# Patient Record
Sex: Female | Born: 1955 | Race: White | Hispanic: No | Smoking: Current every day smoker
Health system: Southern US, Community
[De-identification: ages and names within clinical notes are randomized; demographics above are authoritative.]

## PROBLEM LIST (undated history)

## (undated) DIAGNOSIS — K219 Gastro-esophageal reflux disease without esophagitis: Secondary | ICD-10-CM

## (undated) DIAGNOSIS — N3020 Other chronic cystitis without hematuria: Secondary | ICD-10-CM

## (undated) DIAGNOSIS — R1013 Epigastric pain: Secondary | ICD-10-CM

## (undated) DIAGNOSIS — J45909 Unspecified asthma, uncomplicated: Secondary | ICD-10-CM

## (undated) DIAGNOSIS — C801 Malignant (primary) neoplasm, unspecified: Secondary | ICD-10-CM

## (undated) DIAGNOSIS — Z8739 Personal history of other diseases of the musculoskeletal system and connective tissue: Secondary | ICD-10-CM

## (undated) DIAGNOSIS — I700 Atherosclerosis of aorta: Secondary | ICD-10-CM

## (undated) DIAGNOSIS — F4010 Social phobia, unspecified: Secondary | ICD-10-CM

## (undated) DIAGNOSIS — R1310 Dysphagia, unspecified: Secondary | ICD-10-CM

## (undated) DIAGNOSIS — R519 Headache, unspecified: Secondary | ICD-10-CM

## (undated) DIAGNOSIS — I639 Cerebral infarction, unspecified: Secondary | ICD-10-CM

## (undated) DIAGNOSIS — E7800 Pure hypercholesterolemia, unspecified: Secondary | ICD-10-CM

## (undated) DIAGNOSIS — F17200 Nicotine dependence, unspecified, uncomplicated: Secondary | ICD-10-CM

## (undated) DIAGNOSIS — F32A Depression, unspecified: Secondary | ICD-10-CM

## (undated) DIAGNOSIS — F419 Anxiety disorder, unspecified: Secondary | ICD-10-CM

## (undated) DIAGNOSIS — I779 Disorder of arteries and arterioles, unspecified: Secondary | ICD-10-CM

## (undated) DIAGNOSIS — I10 Essential (primary) hypertension: Secondary | ICD-10-CM

## (undated) DIAGNOSIS — I739 Peripheral vascular disease, unspecified: Secondary | ICD-10-CM

## (undated) DIAGNOSIS — Z974 Presence of external hearing-aid: Secondary | ICD-10-CM

## (undated) DIAGNOSIS — F319 Bipolar disorder, unspecified: Secondary | ICD-10-CM

## (undated) DIAGNOSIS — J4489 Other specified chronic obstructive pulmonary disease: Secondary | ICD-10-CM

## (undated) DIAGNOSIS — J449 Chronic obstructive pulmonary disease, unspecified: Secondary | ICD-10-CM

## (undated) DIAGNOSIS — G459 Transient cerebral ischemic attack, unspecified: Secondary | ICD-10-CM

## (undated) DIAGNOSIS — L900 Lichen sclerosus et atrophicus: Secondary | ICD-10-CM

## (undated) HISTORY — PX: BREAST BIOPSY: SHX20

## (undated) HISTORY — PX: ESOPHAGOGASTRODUODENOSCOPY: SHX1529

## (undated) HISTORY — PX: TONSILLECTOMY: SUR1361

## (undated) HISTORY — PX: TUBAL LIGATION: SHX77

## (undated) HISTORY — PX: OTHER SURGICAL HISTORY: SHX169

## (undated) HISTORY — PX: HEMORRHOIDECTOMY WITH HEMORRHOID BANDING: SHX5633

## (undated) HISTORY — PX: CHOLECYSTECTOMY: SHX55

---

## 1998-01-04 ENCOUNTER — Other Ambulatory Visit: Admission: RE | Admit: 1998-01-04 | Discharge: 1998-01-04 | Payer: Self-pay | Admitting: Obstetrics & Gynecology

## 1999-06-22 ENCOUNTER — Other Ambulatory Visit: Admission: RE | Admit: 1999-06-22 | Discharge: 1999-06-22 | Payer: Self-pay | Admitting: Obstetrics & Gynecology

## 1999-07-23 ENCOUNTER — Other Ambulatory Visit: Admission: RE | Admit: 1999-07-23 | Discharge: 1999-07-23 | Payer: Self-pay | Admitting: Obstetrics & Gynecology

## 2000-07-08 ENCOUNTER — Other Ambulatory Visit: Admission: RE | Admit: 2000-07-08 | Discharge: 2000-07-08 | Payer: Self-pay | Admitting: Obstetrics & Gynecology

## 2009-06-12 DIAGNOSIS — H9209 Otalgia, unspecified ear: Secondary | ICD-10-CM | POA: Insufficient documentation

## 2009-06-12 DIAGNOSIS — K2100 Gastro-esophageal reflux disease with esophagitis, without bleeding: Secondary | ICD-10-CM | POA: Insufficient documentation

## 2009-06-12 DIAGNOSIS — H1045 Other chronic allergic conjunctivitis: Secondary | ICD-10-CM | POA: Insufficient documentation

## 2009-06-12 DIAGNOSIS — N318 Other neuromuscular dysfunction of bladder: Secondary | ICD-10-CM | POA: Insufficient documentation

## 2009-06-13 DIAGNOSIS — K644 Residual hemorrhoidal skin tags: Secondary | ICD-10-CM | POA: Insufficient documentation

## 2009-09-04 DIAGNOSIS — D6959 Other secondary thrombocytopenia: Secondary | ICD-10-CM | POA: Insufficient documentation

## 2009-09-07 DIAGNOSIS — J449 Chronic obstructive pulmonary disease, unspecified: Secondary | ICD-10-CM | POA: Insufficient documentation

## 2009-11-28 DIAGNOSIS — R7301 Impaired fasting glucose: Secondary | ICD-10-CM | POA: Insufficient documentation

## 2010-01-03 DIAGNOSIS — R5381 Other malaise: Secondary | ICD-10-CM | POA: Insufficient documentation

## 2010-01-03 DIAGNOSIS — F4329 Adjustment disorder with other symptoms: Secondary | ICD-10-CM | POA: Insufficient documentation

## 2010-03-21 DIAGNOSIS — J449 Chronic obstructive pulmonary disease, unspecified: Secondary | ICD-10-CM | POA: Insufficient documentation

## 2010-03-21 DIAGNOSIS — F17200 Nicotine dependence, unspecified, uncomplicated: Secondary | ICD-10-CM | POA: Insufficient documentation

## 2010-03-21 DIAGNOSIS — L209 Atopic dermatitis, unspecified: Secondary | ICD-10-CM | POA: Insufficient documentation

## 2010-06-15 DIAGNOSIS — I10 Essential (primary) hypertension: Secondary | ICD-10-CM | POA: Insufficient documentation

## 2010-06-28 ENCOUNTER — Ambulatory Visit: Payer: Self-pay | Admitting: Pain Medicine

## 2010-07-18 ENCOUNTER — Ambulatory Visit: Payer: Self-pay | Admitting: Pain Medicine

## 2010-11-08 ENCOUNTER — Ambulatory Visit: Payer: Self-pay | Admitting: Family Medicine

## 2010-11-14 ENCOUNTER — Ambulatory Visit: Payer: Self-pay | Admitting: Family Medicine

## 2011-02-18 ENCOUNTER — Emergency Department: Payer: Self-pay | Admitting: Emergency Medicine

## 2011-08-16 ENCOUNTER — Encounter: Payer: Self-pay | Admitting: Family Medicine

## 2011-08-17 ENCOUNTER — Encounter: Payer: Self-pay | Admitting: Family Medicine

## 2011-10-22 ENCOUNTER — Ambulatory Visit: Payer: Self-pay | Admitting: Specialist

## 2012-02-06 ENCOUNTER — Ambulatory Visit: Payer: Self-pay | Admitting: Specialist

## 2012-03-18 ENCOUNTER — Observation Stay: Payer: Self-pay | Admitting: Internal Medicine

## 2012-03-18 LAB — CBC
MCH: 31.2 pg (ref 26.0–34.0)
Platelet: 201 10*3/uL (ref 150–440)
RBC: 5.31 10*6/uL — ABNORMAL HIGH (ref 3.80–5.20)
RDW: 13.7 % (ref 11.5–14.5)

## 2012-03-18 LAB — BASIC METABOLIC PANEL
Anion Gap: 4 — ABNORMAL LOW (ref 7–16)
BUN: 11 mg/dL (ref 7–18)
Calcium, Total: 9.6 mg/dL (ref 8.5–10.1)
Chloride: 98 mmol/L (ref 98–107)
Creatinine: 1 mg/dL (ref 0.60–1.30)
Potassium: 4.2 mmol/L (ref 3.5–5.1)
Sodium: 133 mmol/L — ABNORMAL LOW (ref 136–145)

## 2012-03-18 LAB — TROPONIN I
Troponin-I: <0.02 ng/mL
Troponin-I: <0.02 ng/mL

## 2012-03-19 LAB — MAGNESIUM: Magnesium: 1.7 mg/dL — ABNORMAL LOW

## 2012-03-19 LAB — LIPID PANEL: HDL Cholesterol: 18 mg/dL — ABNORMAL LOW (ref 40–60)

## 2012-03-19 LAB — TROPONIN I: Troponin-I: <0.02 ng/mL

## 2012-04-08 ENCOUNTER — Ambulatory Visit: Payer: Self-pay | Admitting: Specialist

## 2013-02-20 DIAGNOSIS — F3289 Other specified depressive episodes: Secondary | ICD-10-CM | POA: Insufficient documentation

## 2013-02-20 DIAGNOSIS — F411 Generalized anxiety disorder: Secondary | ICD-10-CM | POA: Insufficient documentation

## 2013-02-20 DIAGNOSIS — M545 Low back pain, unspecified: Secondary | ICD-10-CM | POA: Insufficient documentation

## 2013-02-20 DIAGNOSIS — G8929 Other chronic pain: Secondary | ICD-10-CM | POA: Insufficient documentation

## 2013-02-20 DIAGNOSIS — M25569 Pain in unspecified knee: Secondary | ICD-10-CM | POA: Insufficient documentation

## 2013-02-20 DIAGNOSIS — M1710 Unilateral primary osteoarthritis, unspecified knee: Secondary | ICD-10-CM | POA: Insufficient documentation

## 2013-04-13 ENCOUNTER — Ambulatory Visit: Payer: Self-pay | Admitting: Internal Medicine

## 2013-04-27 ENCOUNTER — Ambulatory Visit: Payer: Self-pay | Admitting: Family Medicine

## 2013-04-28 ENCOUNTER — Ambulatory Visit: Payer: Self-pay | Admitting: Family Medicine

## 2013-05-18 ENCOUNTER — Ambulatory Visit: Payer: Self-pay | Admitting: Surgery

## 2013-05-19 LAB — PATHOLOGY REPORT

## 2013-05-21 ENCOUNTER — Ambulatory Visit: Payer: Self-pay | Admitting: Urology

## 2013-06-30 DIAGNOSIS — N3942 Incontinence without sensory awareness: Secondary | ICD-10-CM | POA: Insufficient documentation

## 2013-06-30 DIAGNOSIS — R3129 Other microscopic hematuria: Secondary | ICD-10-CM | POA: Insufficient documentation

## 2013-09-05 ENCOUNTER — Inpatient Hospital Stay: Payer: Self-pay | Admitting: Internal Medicine

## 2013-09-05 LAB — CBC
HCT: 45.3 % (ref 35.0–47.0)
HGB: 14.9 g/dL (ref 12.0–16.0)
MCH: 29.2 pg (ref 26.0–34.0)
MCV: 89 fL (ref 80–100)
RBC: 5.12 10*6/uL (ref 3.80–5.20)
RDW: 14.7 % — ABNORMAL HIGH (ref 11.5–14.5)

## 2013-09-05 LAB — COMPREHENSIVE METABOLIC PANEL
Anion Gap: 5 — ABNORMAL LOW (ref 7–16)
BUN: 7 mg/dL (ref 7–18)
Calcium, Total: 8.9 mg/dL (ref 8.5–10.1)
Chloride: 98 mmol/L (ref 98–107)
Co2: 31 mmol/L (ref 21–32)
EGFR (African American): >60
Glucose: 129 mg/dL — ABNORMAL HIGH (ref 65–99)
Osmolality: 268 (ref 275–301)
SGOT(AST): 17 U/L (ref 15–37)
SGPT (ALT): 24 U/L (ref 12–78)
Sodium: 134 mmol/L — ABNORMAL LOW (ref 136–145)

## 2013-09-05 LAB — PRO B NATRIURETIC PEPTIDE: B-Type Natriuretic Peptide: 2099 pg/mL — ABNORMAL HIGH (ref 0–125)

## 2013-09-05 LAB — CK TOTAL AND CKMB (NOT AT ARMC)
CK, Total: 168 U/L (ref 21–215)
CK-MB: 2.5 ng/mL (ref 0.5–3.6)

## 2013-09-06 LAB — BASIC METABOLIC PANEL
Anion Gap: 5 — ABNORMAL LOW (ref 7–16)
BUN: 14 mg/dL (ref 7–18)
Calcium, Total: 9.4 mg/dL (ref 8.5–10.1)
Co2: 28 mmol/L (ref 21–32)
EGFR (Non-African Amer.): >60
Potassium: 4.1 mmol/L (ref 3.5–5.1)

## 2013-09-07 LAB — BASIC METABOLIC PANEL
Anion Gap: 7 (ref 7–16)
BUN: 13 mg/dL (ref 7–18)
Calcium, Total: 9.3 mg/dL (ref 8.5–10.1)
Chloride: 96 mmol/L — ABNORMAL LOW (ref 98–107)
Creatinine: 0.84 mg/dL (ref 0.60–1.30)
EGFR (Non-African Amer.): >60
Potassium: 4.2 mmol/L (ref 3.5–5.1)

## 2013-10-15 ENCOUNTER — Emergency Department: Payer: Self-pay | Admitting: Emergency Medicine

## 2013-10-15 LAB — URINALYSIS, COMPLETE
Bacteria: NONE SEEN
Bilirubin,UR: NEGATIVE
GLUCOSE, UR: NEGATIVE mg/dL (ref 0–75)
KETONE: NEGATIVE
LEUKOCYTE ESTERASE: NEGATIVE
Nitrite: NEGATIVE
Ph: 6 (ref 4.5–8.0)
Protein: NEGATIVE
RBC,UR: 6 /HPF (ref 0–5)
SPECIFIC GRAVITY: 1.005 (ref 1.003–1.030)
Squamous Epithelial: <1

## 2013-11-18 DIAGNOSIS — R3989 Other symptoms and signs involving the genitourinary system: Secondary | ICD-10-CM | POA: Insufficient documentation

## 2013-11-18 DIAGNOSIS — N3941 Urge incontinence: Secondary | ICD-10-CM | POA: Insufficient documentation

## 2013-12-02 DIAGNOSIS — E781 Pure hyperglyceridemia: Secondary | ICD-10-CM | POA: Insufficient documentation

## 2013-12-02 DIAGNOSIS — E871 Hypo-osmolality and hyponatremia: Secondary | ICD-10-CM | POA: Insufficient documentation

## 2013-12-15 ENCOUNTER — Ambulatory Visit: Payer: Self-pay | Admitting: Surgery

## 2014-03-23 DIAGNOSIS — R339 Retention of urine, unspecified: Secondary | ICD-10-CM | POA: Insufficient documentation

## 2014-03-23 DIAGNOSIS — B3749 Other urogenital candidiasis: Secondary | ICD-10-CM | POA: Insufficient documentation

## 2014-05-02 ENCOUNTER — Ambulatory Visit: Payer: Self-pay | Admitting: Internal Medicine

## 2014-05-12 ENCOUNTER — Emergency Department: Payer: Self-pay | Admitting: Emergency Medicine

## 2014-05-12 LAB — URINALYSIS, COMPLETE
Bilirubin,UR: NEGATIVE
GLUCOSE, UR: NEGATIVE mg/dL (ref 0–75)
Ketone: NEGATIVE
Leukocyte Esterase: NEGATIVE
Nitrite: NEGATIVE
PH: 5 (ref 4.5–8.0)
PROTEIN: NEGATIVE
RBC,UR: 1 /HPF (ref 0–5)
SPECIFIC GRAVITY: 1.004 (ref 1.003–1.030)
Squamous Epithelial: NONE SEEN
WBC UR: 1 /HPF (ref 0–5)

## 2014-06-11 ENCOUNTER — Emergency Department: Payer: Self-pay | Admitting: Emergency Medicine

## 2014-06-11 LAB — URINALYSIS, COMPLETE
BILIRUBIN, UR: NEGATIVE
Bacteria: NONE SEEN
Glucose,UR: NEGATIVE mg/dL (ref 0–75)
KETONE: NEGATIVE
Leukocyte Esterase: NEGATIVE
Nitrite: NEGATIVE
Ph: 6 (ref 4.5–8.0)
Protein: NEGATIVE
RBC,UR: 2 /HPF (ref 0–5)
SPECIFIC GRAVITY: 1.002 (ref 1.003–1.030)

## 2014-06-11 LAB — COMPREHENSIVE METABOLIC PANEL
ALBUMIN: 3.2 g/dL — AB (ref 3.4–5.0)
ALT: 46 U/L
Alkaline Phosphatase: 66 U/L
Anion Gap: 5 — ABNORMAL LOW (ref 7–16)
BUN: 9 mg/dL (ref 7–18)
Bilirubin,Total: 0.3 mg/dL (ref 0.2–1.0)
CO2: 29 mmol/L (ref 21–32)
Calcium, Total: 9.4 mg/dL (ref 8.5–10.1)
Chloride: 102 mmol/L (ref 98–107)
Creatinine: 0.8 mg/dL (ref 0.60–1.30)
EGFR (African American): >60
EGFR (Non-African Amer.): >60
GLUCOSE: 118 mg/dL — AB (ref 65–99)
OSMOLALITY: 272 (ref 275–301)
POTASSIUM: 4.3 mmol/L (ref 3.5–5.1)
SGOT(AST): 27 U/L (ref 15–37)
SODIUM: 136 mmol/L (ref 136–145)
Total Protein: 7.1 g/dL (ref 6.4–8.2)

## 2014-06-11 LAB — CBC
HCT: 46.7 % (ref 35.0–47.0)
HGB: 15.8 g/dL (ref 12.0–16.0)
MCH: 29.5 pg (ref 26.0–34.0)
MCHC: 33.8 g/dL (ref 32.0–36.0)
MCV: 87 fL (ref 80–100)
PLATELETS: 200 10*3/uL (ref 150–440)
RBC: 5.34 10*6/uL — AB (ref 3.80–5.20)
RDW: 14.4 % (ref 11.5–14.5)
WBC: 6.1 10*3/uL (ref 3.6–11.0)

## 2014-06-11 LAB — ACETAMINOPHEN LEVEL

## 2014-06-11 LAB — DRUG SCREEN, URINE
Amphetamines, Ur Screen: NEGATIVE (ref ?–1000)
BENZODIAZEPINE, UR SCRN: POSITIVE (ref ?–200)
Barbiturates, Ur Screen: NEGATIVE (ref ?–200)
CANNABINOID 50 NG, UR ~~LOC~~: NEGATIVE (ref ?–50)
COCAINE METABOLITE, UR ~~LOC~~: NEGATIVE (ref ?–300)
MDMA (Ecstasy)Ur Screen: NEGATIVE (ref ?–500)
Methadone, Ur Screen: NEGATIVE (ref ?–300)
OPIATE, UR SCREEN: NEGATIVE (ref ?–300)
Phencyclidine (PCP) Ur S: NEGATIVE (ref ?–25)
Tricyclic, Ur Screen: NEGATIVE (ref ?–1000)

## 2014-06-11 LAB — ETHANOL

## 2014-06-11 LAB — SALICYLATE LEVEL: Salicylates, Serum: 6.8 mg/dL — ABNORMAL HIGH

## 2014-07-12 ENCOUNTER — Encounter: Payer: Self-pay | Admitting: Family Medicine

## 2014-07-17 ENCOUNTER — Encounter: Payer: Self-pay | Admitting: Family Medicine

## 2014-10-21 ENCOUNTER — Ambulatory Visit: Payer: Self-pay | Admitting: Unknown Physician Specialty

## 2014-11-17 DIAGNOSIS — M129 Arthropathy, unspecified: Secondary | ICD-10-CM | POA: Insufficient documentation

## 2014-11-17 DIAGNOSIS — C801 Malignant (primary) neoplasm, unspecified: Secondary | ICD-10-CM | POA: Insufficient documentation

## 2014-11-17 DIAGNOSIS — K219 Gastro-esophageal reflux disease without esophagitis: Secondary | ICD-10-CM | POA: Insufficient documentation

## 2014-11-18 DIAGNOSIS — N280 Ischemia and infarction of kidney: Secondary | ICD-10-CM | POA: Insufficient documentation

## 2014-11-18 DIAGNOSIS — N393 Stress incontinence (female) (male): Secondary | ICD-10-CM | POA: Insufficient documentation

## 2014-11-21 ENCOUNTER — Emergency Department: Payer: Self-pay | Admitting: Student

## 2014-12-07 ENCOUNTER — Ambulatory Visit: Payer: Self-pay | Admitting: Neurology

## 2014-12-15 DIAGNOSIS — R519 Headache, unspecified: Secondary | ICD-10-CM | POA: Insufficient documentation

## 2015-01-06 NOTE — H&P (Signed)
PATIENT NAME:  Sylvia Richardson, Sylvia Richardson MR#:  [id number] DATE OF BIRTH:  12/01/1955  DATE OF ADMISSION:  09/05/2013  PRIMARY CARE PHYSICIAN: At [facility] in [place], Dr. Dema Severin.   CHIEF COMPLAINT: Shortness of breath and cough.   HISTORY OF PRESENT ILLNESS: A 59 year old Caucasian female patient with history of COPD, tobacco abuse, hypertension, presents to the Emergency Room complaining of 3 days of shortness of breath and cough. The patient has had chronic cough with her smoking, but this has worsened over the last 3 days.   She has had clear productive sputum. No chest pain, shortness of breath, wheezing. The patient mentions that she is trying to quit smoking, and using Nicotrol inhalers at this time. She also mentions that people around her smoke a lot. She has tried inhalers at home, which have not helped her, and presented to the Emergency Room. Here, patient desaturated to 87% on room air, needing 2 liters of oxygen, with tachypnea and tachycardia. No pneumonia on chest x-ray. She is being admitted to the hospital.   PAST MEDICAL HISTORY: 1.  Hypertension.  2.  COPD.   3.  Hyperlipidemia.  4.  GERD.  5.  Gastroparesis.  6.  Tobacco abuse.  7.  Chronic stress incontinence.  8.  Left renal mass.   SOCIAL HISTORY: The patient smokes a pack a day. No alcohol. No illicit drugs.   FAMILY HISTORY: Positive for stroke and heart attack.   PAST SURGICAL HISTORY:  C-section.   ALLERGIES:  CIPRO, PENICILLIN, PROCARDIA and TRILIPIX.   HOME MEDICATIONS INCLUDE: 1.  Atenolol 100 mg daily.  2.  Budesonide formoterol 160/4.5 inhaled 1 puff b.i.d.  3.  Cymbalta 60 mg 2 tablets oral daily.  4.  Hydrochlorothiazide 25 mg oral daily.  5.  Lipitor 40 mg oral at bedtime.  6.  Reglan 10 mg oral 4 times a day.  7.  Protonix 40 mg oral daily.  8.  Singulair 10 mg oral daily.  9.  Trazodone 300 mg oral at bedtime.   REVIEW OF SYSTEMS:   CONSTITUTIONAL: Complains of fatigue.  EYES:  No blurred  vision, pain, redness.  EARS, NOSE, THROAT: No tinnitus, ear pain, hearing loss.  RESPIRATORY: No chest pain. Has shortness of breath, cough, COPD.   CARDIOVASCULAR: No edema.  GASTROINTESTINAL: No nausea, vomiting, diarrhea, abdominal pain. GENITOURINARY:  Has stress incontinence.  ENDOCRINE: No polyuria, nocturia, thyroid problems.  HEMATOLOGIC AND LYMPHATIC: No anemia, easy bruising, bleeding. INTEGUMENT:  No acne, rash, lesion.  MUSCULOSKELETAL: No joint swelling or redness.   PHYSICAL EXAMINATION: VITAL SIGNS: Temperature 98.9, pulse of 112, respirations 26, blood pressure 129/60, saturating 87% on room air and 93% on 2 liters oxygen.  GENERAL: An obese Caucasian female patient lying in bed with conversational dyspnea.  PSYCHIATRIC: Alert and oriented x 3. Mood and affect appropriate. Judgment intact.  HEENT: Atraumatic, normocephalic. Oral mucosa moist and pink. External ears and nose normal. No pallor or icterus. Pupils bilaterally equal and react to light.  NECK: Supple. No thyromegaly or palpable lymph nodes. Trachea midline. No carotid bruits, JVD.  CARDIOVASCULAR: S1, S2, tachycardic, without any murmurs. Peripheral pulses 2+. No edema.  RESPIRATORY: Increased work of breathing, using accessory muscles. Bilateral wheezing, decreased air entry.  GASTROINTESTINAL: Soft abdomen, nontender. Bowel sounds present. No organomegaly palpable.  SKIN: Warm and dry. No petechiae, rash, ulcers.  MUSCULOSKELETAL: No joint swelling, redness, effusion of the large joints. Normal muscle tone.  NEUROLOGIC: Motor strength 5/5 in upper extremities. Sensation grossly  intact all over. LYMPHATIC:  No cervical lymphadenopathy.   LAB STUDIES: Show glucose of 129, BUN 7, creatinine 0.90, sodium 134, potassium 3.3, chloride 98. AST, ALT, alkaline phosphatase normal. Troponin less than 0.02.  hemoglobin 14.9, platelets of 147. Influenza A and B negative. BNP of 2099.   EKG shows sinus tachycardia.   Chest  x-ray shows no pulmonary edema or infiltrate.   ASSESSMENT AND PLAN: 1.  Acute respiratory failure with acute chronic obstructive pulmonary disease exacerbation in a patient with tobacco abuse. I have counseled patient to quit smoking for greater than 3 minutes. I have advised her to quit smoking, as she might end up on oxygen if she continues to smoke and has more COPD flare-ups. Will admit patient onto medical floor, IV steroids, nebulizers q. 4 hours. If there is no improvement, she sees Dr. Humphrey Rolls of Pulmonology, who can be consulted. Will use doxycycline, as she has allergy to fluoroquinolones.   2.  Elevated BNP. The patient does not have any signs of congestive heart failure. Also, chest x-ray shows no pulmonary edema. No history of congestive heart failure. Monitor for any fluid overload.   3.  Hypertension. Continue home medications.   4.  Deep venous thrombosis prophylaxis with Lovenox.   5.  Code status: FULL CODE.   Time spent on this case was 43 minutes.    ____________________________ Leia Alf Aluel Schwarz, MD srs:mr D: 09/05/2013 15:22:00 ET T: 09/05/2013 16:33:34 ET JOB#: [id number]  cc: [facility] Kelvyn Schunk R. Maiyah Goyne, MD, <Dictator> DR. Gladwin MD ELECTRONICALLY SIGNED 09/06/2013 12:22

## 2015-01-07 NOTE — Consult Note (Signed)
PATIENT NAME:  Sylvia Richardson, Sylvia Richardson MR#:  [id number] DATE OF BIRTH:  [birth year]  DATE OF CONSULTATION:  06/12/2014  CONSULTING PHYSICIAN:  Carson Bogden K. Oyindamola Key, MD  PATIENT'S AGE:  59 years.   SEX:  Female.  RACE:  White.  SUBJECTIVE: The patient was seen in consultation at [facility] ER   The patient is a 59 year old white female not employed, on disability for COPD and bipolar disorder with depression. The patient divorced for 15 years and lives by herself in a mobile home. Patient comes to [facility] on involuntary commitment after she had argument with her sister who almost gave her a nervous breakdown. Patient reports that she asked her sister to move in with her a few months ago because the sister was homeless and did not have a place to stay. The patient's sister has been screaming at her to the extent that the patient was ready to have a nervous breakdown and sister brought her here.   PAST PSYCHIATRIC HISTORY: No previous history of inpatient on psychiatry.  No history of suicidal ideation.  The patient is being followed by Dr. Holley Raring at [facility] and last appointment was in July 2015 and next appointment is coming up soon.    ALCOHOL AND DRUGS: Denies drinking alcohol, denies street or recreational drug abuse.  Does admit smoking nicotine cigarettes, at least a pack a day for many years.    MENTAL STATUS:  The patient is alert and oriented to place, person and calm, pleasant and cooperative. No agitation, affect is neutral.  Mood stable.  Admits that she is upset and anxious about the sister screaming at her to the extent that she was ready to have a nervous breakdown.  She feels much better and rested after she came her.  No psychosis.  Does not appear to be responding to internal stimuli.  Memory is intact. Cognition intact. Denies suicidal or homicidal plan and contracts for safety.  Eager to go home, take care of herself and she has already told her sister to move out.   IMPRESSION: Bipolar disorder,  depressed, currently stable. Family conflicts with her sister who constantly screams at the patient to the extent that the patient requested her sister to move out of her house.   RECOMMENDATION: Discontinue involuntary commitment.  Discharge the patient to go back home and keep up with her followup appointments at [facility] with Dr. Holley Raring. The patient has enough of a supply of medications at home.    ____________________________ Wallace Cullens. Franchot Mimes, MD skc:lt D: 06/12/2014 12:50:39 ET T: 06/12/2014 14:46:43 ET JOB#: [id number]  cc: Arlyn Leak K. Franchot Mimes, MD, <Dictator> Dewain Penning MD ELECTRONICALLY SIGNED 07/01/2014 14:20

## 2015-01-07 NOTE — Discharge Summary (Signed)
PATIENT NAME:  Sylvia Richardson, Sylvia Richardson MR#:  [id number] DATE OF BIRTH:  31-Dec-1955  DATE OF ADMISSION:  09/05/2013 DATE OF DISCHARGE:  09/10/2013  ADMITTING DIAGNOSIS:  Shortness of breath, cough.   DISCHARGE DIAGNOSES:  1.  Acute on chronic respiratory failure due to acute on chronic obstructive pulmonary disease exacerbation.  2.  Acute bronchitis.  3.  Nicotine addiction.  4.  Elevated BNP without any evidence of congestive heart failure.  5.  Hypertension.  6.  Depression and anxiety.  7.  Chronic respiratory failure. The patient qualified for home O2.   CONSULTANTS:  None.   PERTINENT LABS AND EVALUATIONS:  Admitting glucose 129, BUN 7, creatinine 0.90, sodium 134, potassium 3.3, chloride 98, CO2 was 21, calcium 8.9. LFTs were normal except albumin at 3.1. CPK 168, CK-MB 2.5. Troponin less than 0.02. WBC 7.3, hemoglobin 14.9, platelet count 147. Influenza A and B was negative. EKG showed sinus tachycardia without any ST-T wave changes. Chest x-ray said no acute cardiopulmonary process.   HOSPITAL COURSE:  Please refer to H and P done by the admitting physician. The patient is a 59 year old white female with a history of COPD, tobacco abuse, hypertension, who presented to the ED with complaint of 3 days of shortness of breath. The patient continues to smoke. She also reported that she has been short of breath with activity at home, chronically. She came to the ED with these symptoms, had a chest x-ray, which was negative. She was noted to have acute respiratory failure for which she was admitted for acute on chronic respiratory failure due to acute on COPD exacerbation. She was treated with nebulizers, IV steroids, inhalers, and mucolytics. With these treatments, she started to improve slowly. However, she continues to have shortness of breath. She has been ambulated and her saturation continues to remain below 87 with activity. Therefore home O2 is arranged. At this time, she is doing much better  and is stable for discharge.   DISCHARGE MEDICATIONS: 1.  Atenolol 100 daily.  2.  Hydrochlorothiazide 25 daily.  3.  Klonopin 0.5 mg 1 tab p.o. t.i.d., needed for anxiety.  4.  Cymbalta 60 daily.  5.  Lipitor 10 at bedtime. 6.  Symbicort 2 puffs b.i.d. 7.  Reglan 10 mg 1 tab 4 times a day. 8.  ProAir 2 puffs 4 times a day as needed.  9.  Singulair 10 daily.  10.  Trazodone 100, 2 tabs at bedtime.  11.  Nexium 40 daily.  12.  Triamcinolone topically apply to affected area q.4 as needed for itching.  13.  Tylenol 650 q.4 p.r.n. for pain. 14.  Prednisone taper, start at 60 mg, taper x 10 mg until complete.  15.  Mucinex 600, 1 tab p.o. b.i.d. 16.  Doxycycline 100 mg 1 tab p.o. q.12 x 4 days.  17.  Spiriva 18 mcg daily.  18.  Albuterol ipratropium nebulizers as needed q.6 p.r.n., wheezing.  19.  Nicotine 21 mg patch transdermally daily for 6 weeks.   HOME OXYGEN:   Yes, via nasal cannula, 2 L.   DIET:  Low sodium.   ACTIVITY:  As tolerated.   FOLLOWUP:  Primary M.D. in 1 to 2 weeks. Follow up with Dr. Raul Del in 4 to 6 weeks. Also, the patient instructed to stop smoking.   NOTE:  35 minutes spent on this discharge.  ____________________________ Lafonda Mosses. Posey Pronto, MD shp:jm D: 09/10/2013 15:07:55 ET T: 09/10/2013 15:45:04 ET JOB#: [id number]  cc: Chaniya Genter H. Posey Pronto, MD, <Dictator>  Alric Seton MD ELECTRONICALLY SIGNED 09/19/2013 8:31

## 2015-01-08 NOTE — H&P (Signed)
PATIENT NAME:  Sylvia Richardson, Sylvia Richardson MR#:  [id number] DATE OF BIRTH:  [birth year]  DATE OF ADMISSION:  03/18/2012  PRIMARY CARE PHYSICIAN: Dr. Clayborn Bigness  REFERRING PHYSICIAN: Dr. Power   CHIEF COMPLAINT: Chest pain today.   HISTORY OF PRESENT ILLNESS: 59 year old Caucasian female with a history of chronic obstructive pulmonary disease, hypertension, hyperlipidemia, GERD, gastroparesis, tobacco use presented to the ED with chest pain today. Patient started to have chest pain about 6:00 a.m. when she was making coffee. The chest pain is on the left side, constant until patient arrived to ED, radiates to the left side of neck and left shoulder. Chest pain was 10/10 in a.m. but now decreased to 2/10. Chest pain was exacerbated by breathing but patient denies any diaphoresis or palpitations. No shortness of breath. Patient denies any orthopnea, nocturnal dyspnea or leg swelling. Patient had a stress test three years ago which was negative. Since patient has tobacco use and chronic obstructive pulmonary disease, she has cough, sputum, and shortness of breath sometimes patient was treated with aspirin, nitro and Protonix just now.   PAST MEDICAL HISTORY: As mentioned above:  1. Hypertension. 2. Chronic obstructive pulmonary disease. 3. Hyperlipidemia. 4. Gastroesophageal reflux disease. 5. Gastroparesis.  6. Tobacco use.    SOCIAL HISTORY: Patient has been smoking 1 pack a day for 30 years. Denies any alcohol or illicit drugs.   FAMILY HISTORY: Positive for stroke and heart attack.   PAST SURGICAL HISTORY: C-section.   ALLERGIES: Cipro, penicillin, Procardia and Trilipix.    HOME MEDICATIONS: 1. Atenolol 100 mg p.o. daily.  2. Budesonide formoterol 160 mcg/4.5 mcg inhalation 1 puff b.i.d.  3. Cymbalta 60 mg 2 tablets p.o. daily. 4. HCTZ 25 mg p.o. daily.  5. Lipitor 40 mg p.o. at bedtime.  6. Reglan 10 mg p.o. q.i.d.  7. Protonix 40 mg p.o. daily.  8. Singulair 10 mg p.o. daily.  9. Trazodone  300 mg p.o. at bedtime.   REVIEW OF SYSTEMS: CONSTITUTIONAL: Patient denies any fever or chills. No headache or dizziness. No weight loss, but has weight gain recently. EYES: No double vision, blurred vision. ENT: No epistaxis, postnasal drip, slurred speech or dysphagia. RESPIRATORY: Sometimes cough, sputum, and shortness of breath. Patient has chronic obstructive pulmonary disease. CARDIOVASCULAR: Positive for chest pain, but no palpitations, orthopnea, nocturnal dyspnea. No leg edema. GASTROINTESTINAL: No abdominal pain but has nausea. No vomiting or diarrhea, melena or bloody stool. GENITOURINARY: No dysuria, hematuria, or incontinence but has urine frequency. ENDOCRINE: Positive for polyuria but no polydipsia, heat or cold intolerance. HEMATOLOGY: No easy bruising or bleeding. NEUROLOGY: No syncope, loss of consciousness or seizure.   PHYSICAL EXAMINATION:  VITAL SIGNS: Temperature 97.8, blood pressure 121/62, pulse 75, oxygen saturation 93% on room air.    GENERAL: Patient is alert, awake, oriented in no acute distress.   HEENT: Pupils round, equal, reactive to light, accommodation. Moist oral mucosa. Clear oropharynx.   NECK: Supple. No JVD or carotid bruits. No lymphadenopathy. No thyromegaly.   CARDIOVASCULAR: S1, S2 regular rate and rhythm. No murmurs, gallops.   PULMONARY: Bilateral air entry. No wheezing or rales.   CHEST WALL: No tenderness.   ABDOMEN: Soft. No distention or tenderness. No organomegaly. Bowel sounds present.   EXTREMITIES: No edema, clubbing, or cyanosis. No calf tenderness. Strong bilateral pedal pulses.   SKIN: No rash or jaundice.   NEUROLOGIC: Alert and oriented x3. No focal deficit. Power 5/5. Sensation intact. Deep tendon reflexes 2+.   LABORATORY, DIAGNOSTIC, AND RADIOLOGICAL DATA: CBC:  WBC 8.6, hemoglobin 16.6, platelet 201, glucose 95, BUN 11, creatinine 1.0, sodium 133, potassium 4.2, sodium bicarbonate 31, chloride 98. Troponin less than 0.02. CK  148. CK-MB 2.2. EKG shows sinus rhythm at 78 beats per minute.   IMPRESSION:  1. Chest pain, need to rule out ACS.  2. Hypertension.  3. Hyperlipidemia.  4. Chronic obstructive pulmonary disease.  5. Gastroesophageal reflux disease.  6. Gastroparesis.   7. Tobacco use.  8. Hyponatremia, possibly due to HCTZ.   PLAN OF TREATMENT:  1. Patient will be placed for observation. Will continue telemetry monitor. Continue aspirin 81 mg p.o. daily, continue Lipitor 40 mg p.o. at bedtime. Will give nitroglycerin p.r.n., morphine p.r.n. and follow up troponin level for another two sets and we will check lipid panel, TSH, hemoglobin A1c.   2. Will get a stress test if possible. Since tomorrow is Independence Day, patient may not get stress test. If patient cannot get stress test and patient's troponin is negative patient may be discharged and follow up stress test as outpatient.  3. For hypertension will continue atenolol, HCTZ.  4. For gastroesophageal reflux disease, gastroparesis, will continue Protonix, Zofran p.r.n.  5. Smoking cessation. Counseled. Smoking cessation information was given.   Discussed the patient's situation with patient.   TIME SPENT: About 55 minutes.  ____________________________ Demetrios Loll, MD qc:cms D: 03/18/2012 14:01:24 ET T: 03/18/2012 14:22:24 ET JOB#: [id number]  cc: Demetrios Loll, MD, <Dictator> Heinz Knuckles. Blocker, MD Demetrios Loll MD ELECTRONICALLY SIGNED 03/18/2012 14:58

## 2015-01-08 NOTE — Discharge Summary (Signed)
PATIENT NAME:  Sylvia Richardson, Sylvia Richardson MR#:  [id number] DATE OF BIRTH:  05-Jul-1956  DATE OF ADMISSION:  03/18/2012 DATE OF DISCHARGE:  03/19/2012  DISCHARGE DIAGNOSES: 1. Chest pain, likely noncardiac, could be muscular in nature. 2. Hypomagnesemia, repleted. 3. Hypertriglyceridemia, started on fish oil.   SECONDARY DIAGNOSES: 1. Hypertension. 2. Chronic obstructive pulmonary disease. 3. Hyperlipidemia. 4. Gastroesophageal reflux disease. 5. Gastroparesis.   CONSULTATIONS: None.  PROCEDURES/RADIOLOGY: Chest x-ray on admission showed no acute cardiopulmonary disease. The right hemidiaphragm remained mildly elevated with respect to the left.   HISTORY AND SHORT HOSPITAL COURSE: The patient is a 59 year old female with the above-mentioned medical problems, was admitted for chest pain. Please see Dr. Lianne Moris dictated history and physical for further details.  The patient was ruled out with 3 negative sets of cardiac enzymes. She had a normal TSH. She was found to low magnesium with a value of 1.7, which was repleted. She also had hypertriglyceridemia with triglyceride of 825 for which she was started on fish oil. She was doing much better and was discharged home on fourth of July in stable condition.   PHYSICAL EXAMINATION: VITAL SIGNS: On the date of discharge vital signs were as follows: Temperature 98.4, heart rate 72 per minute, respirations 20 per minute, blood pressure 127/78 mmHg, and she was saturating 92% on room air.   PERTINENT PHYSICAL EXAMINATION ON THE DATE OF DISCHARGE:  CARDIOVASCULAR: S1, S2 normal. No murmur, rubs, or gallop.   LUNGS: Clear to auscultation bilaterally. No wheezing, rales, rhonchi appreciated.  ABDOMEN: Soft, benign.   NEUROLOGIC: Nonfocal examination.  All other physical examination remained at baseline.   DISCHARGE MEDICATIONS: 1. Atenolol 100 mg p.o. daily. 2. Singulair once daily. 3. Hydrochlorothiazide 25 mg p.o. daily.  4. Protonix 40 mg p.o.  daily.  5. Klonopin 0.5 mg 1 tablet p.o. 3 times a day as needed.  6. ProAir 2 puffs inhaled once a day. 7. Cymbalta 60 mg p.o. daily. 8. Lipitor 10 mg p.o. at bedtime.  9. Trazodone 150 mg p.o. at bedtime. 10. Metoclopramide 10 mg p.o. b.i.d.  11. Nicotine 14 mg transdermal patch once daily.  12. Fish oil 1000 mg p.o. twice a day. 13. Symbicort 1 puff inhaled twice a day.  DISCHARGE DIET: Low sodium, low cholesterol.   DISCHARGE ACTIVITY: As tolerated.  DISCHARGE INSTRUCTIONS AND FOLLOW-UP: The patient was instructed to follow-up          with her primary care physician Dr. Clayborn Bigness in 1 to 2 weeks.   TIME: Total time discharging this patient was 55 minutes.    ____________________________ Lucina Mellow. Manuella Ghazi, MD vss:kma D: 03/19/2012 12:07:11 ET T: 03/20/2012 11:49:01 ET JOB#: [id number]  cc: Kerron Sedano S. Manuella Ghazi, MD, <Dictator> [place] Blocker, MD Remer Macho MD ELECTRONICALLY SIGNED 03/20/2012 19:45

## 2015-01-27 ENCOUNTER — Other Ambulatory Visit: Payer: Self-pay | Admitting: Vascular Surgery

## 2015-01-27 DIAGNOSIS — I6529 Occlusion and stenosis of unspecified carotid artery: Secondary | ICD-10-CM

## 2015-02-03 ENCOUNTER — Ambulatory Visit
Admission: RE | Admit: 2015-02-03 | Discharge: 2015-02-03 | Disposition: A | Discharge status: Home / Self Care | Payer: Medicaid Other | Source: Ambulatory Visit | Attending: Vascular Surgery | Admitting: Vascular Surgery

## 2015-02-03 DIAGNOSIS — I6529 Occlusion and stenosis of unspecified carotid artery: Secondary | ICD-10-CM | POA: Diagnosis present

## 2015-02-03 DIAGNOSIS — I6523 Occlusion and stenosis of bilateral carotid arteries: Secondary | ICD-10-CM | POA: Insufficient documentation

## 2015-02-03 HISTORY — DX: Essential (primary) hypertension: I10

## 2015-02-03 MED ORDER — IOHEXOL 350 MG/ML SOLN
80.0000 mL | Freq: Once | INTRAVENOUS | Status: AC | PRN
  Administered 2015-02-03: 80 mL via INTRAVENOUS

## 2015-02-16 DIAGNOSIS — F4010 Social phobia, unspecified: Secondary | ICD-10-CM | POA: Insufficient documentation

## 2015-03-06 ENCOUNTER — Ambulatory Visit
Admission: RE | Admit: 2015-03-06 | Discharge: 2015-03-06 | Disposition: A | Discharge status: Home / Self Care | Payer: Medicaid Other | Source: Ambulatory Visit | Attending: Internal Medicine | Admitting: Internal Medicine

## 2015-03-06 ENCOUNTER — Other Ambulatory Visit: Payer: Self-pay | Admitting: Internal Medicine

## 2015-03-06 DIAGNOSIS — R05 Cough: Secondary | ICD-10-CM | POA: Diagnosis not present

## 2015-03-06 DIAGNOSIS — R059 Cough, unspecified: Secondary | ICD-10-CM

## 2015-03-06 DIAGNOSIS — R079 Chest pain, unspecified: Secondary | ICD-10-CM | POA: Insufficient documentation

## 2015-03-06 DIAGNOSIS — M5489 Other dorsalgia: Secondary | ICD-10-CM | POA: Diagnosis not present

## 2015-06-13 IMAGING — CT CT HEAD WITHOUT CONTRAST
1 series · 16 of 30 positions shown, 20 images · non-contrast
Comparison: None.

CLINICAL DATA: Stroke symptoms today. Left-sided headache with
numbness to the left side of the face starting at 3 p.m. and lasting
2 hr.

EXAM:
CT HEAD WITHOUT CONTRAST
TECHNIQUE: Contiguous axial images were obtained from the base of the skull
through the vertex without intravenous contrast.

[Series 2: head wo · axial · 0.41mm/px · z∈[-94,+32]mm · 16 of 32 slices shown, 20 images]
[im 2/32  brain]
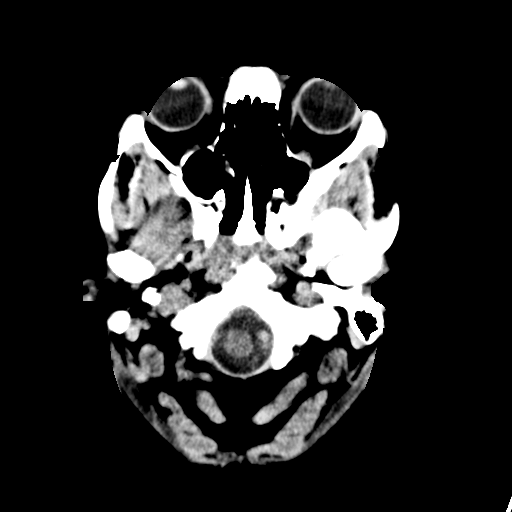
[im 2/32  bone]
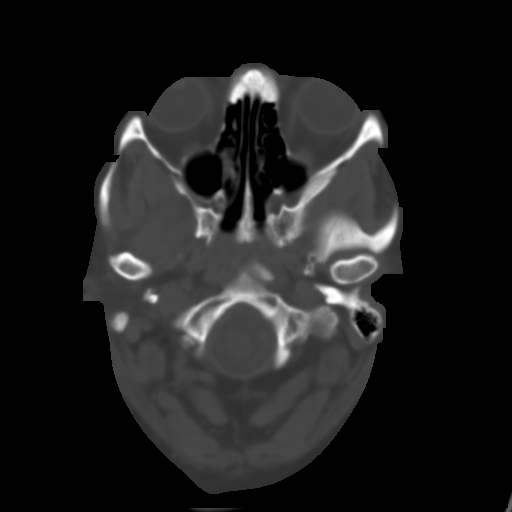
[im 4/32  brain]
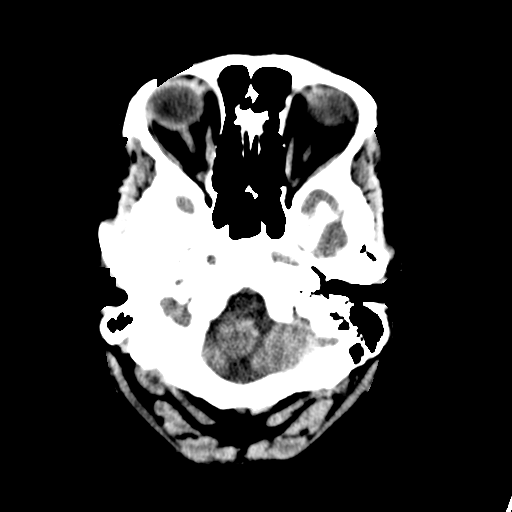
[im 6/32  brain]
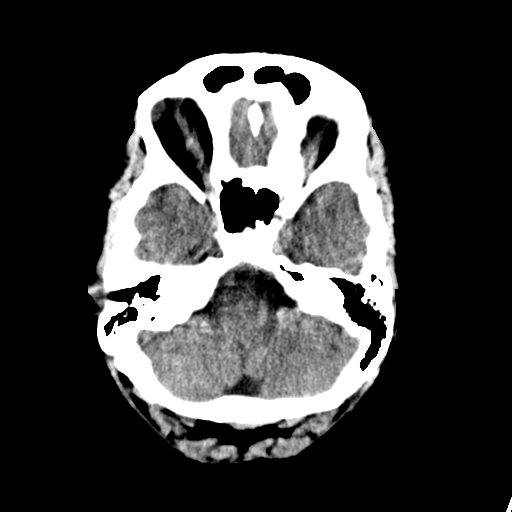
[im 8/32  brain]
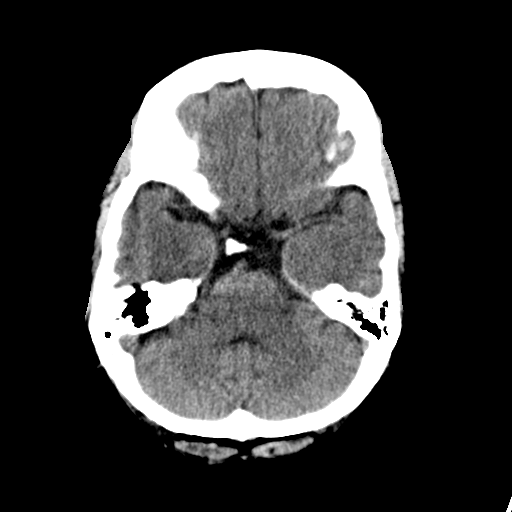
[im 9/32  brain]
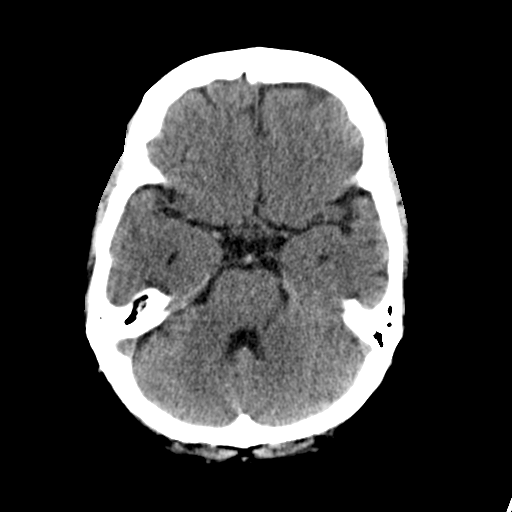
[im 9/32  bone]
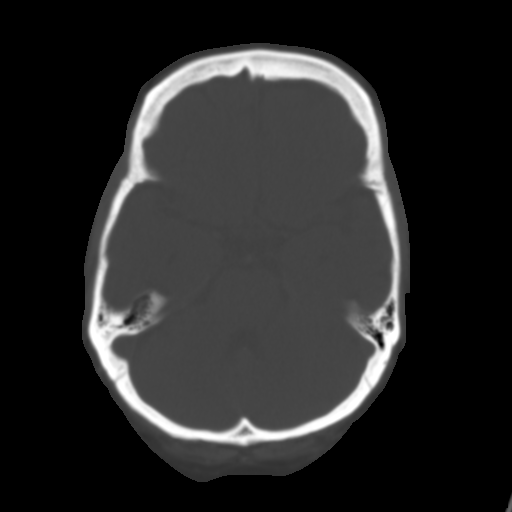
[im 11/32  brain]
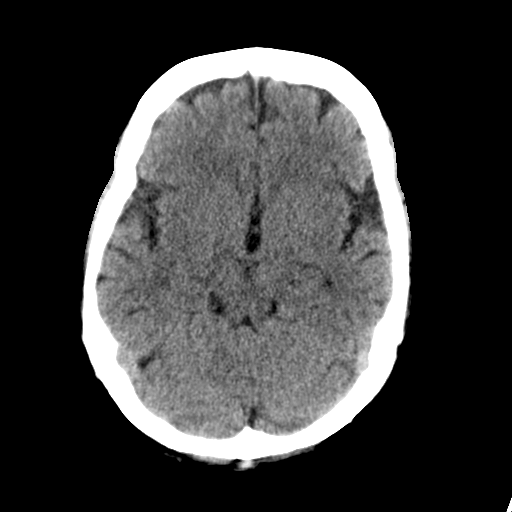
[im 13/32  brain]
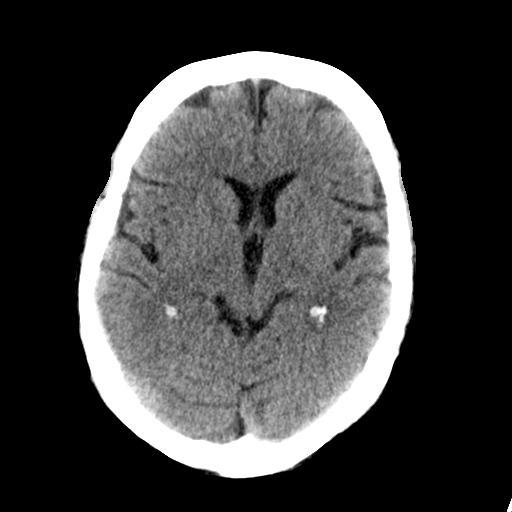
[im 15/32  brain]
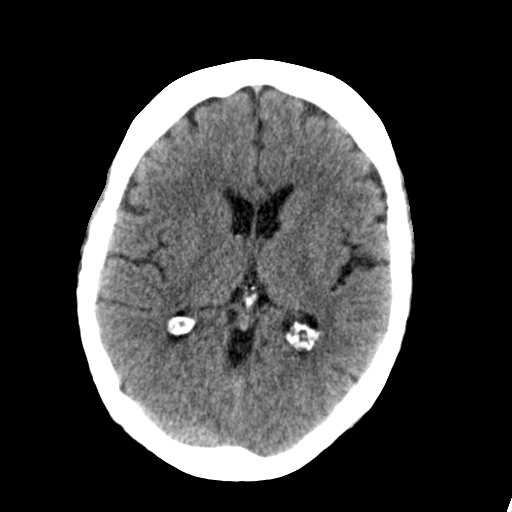
[im 17/32  brain]
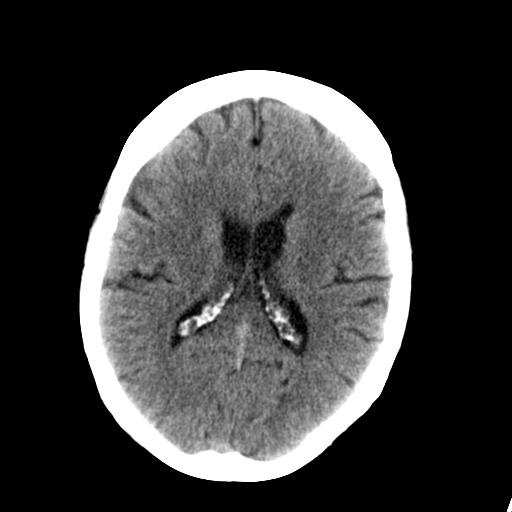
[im 17/32  bone]
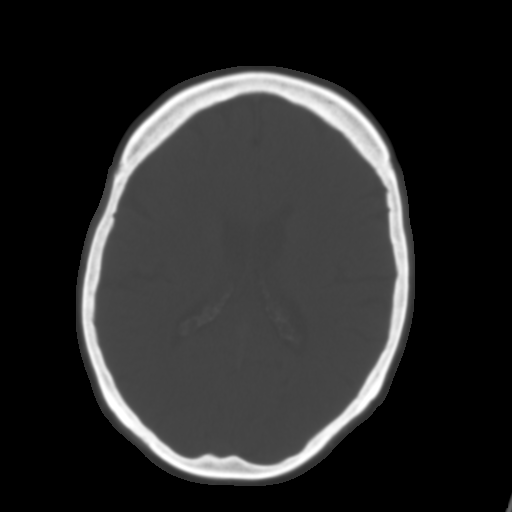
[im 19/32  brain]
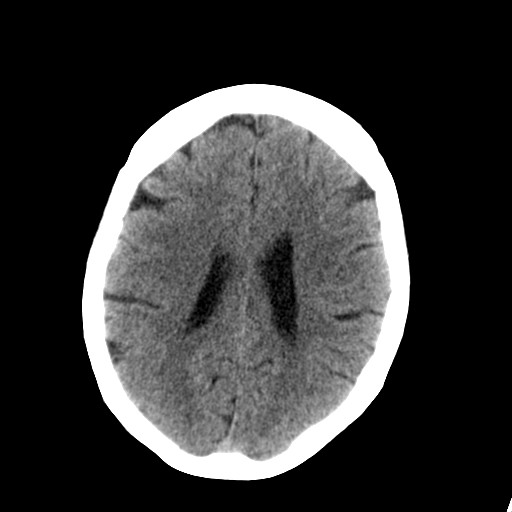
[im 21/32  brain]
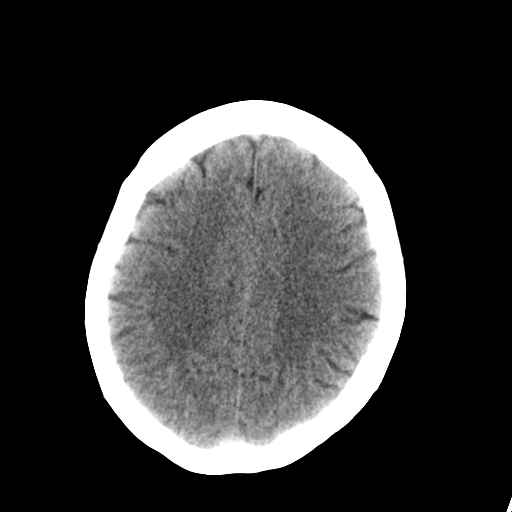
[im 23/32  brain]
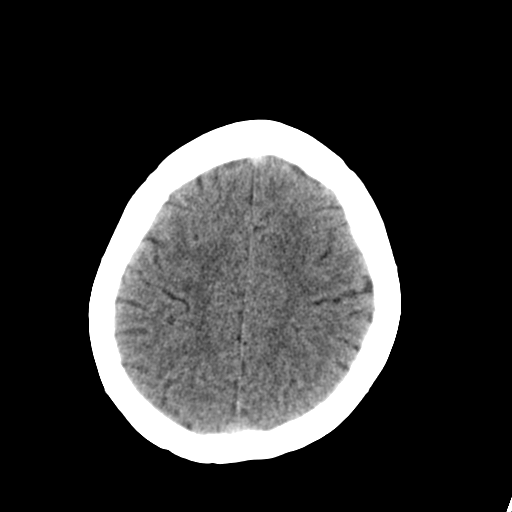
[im 24/32  brain]
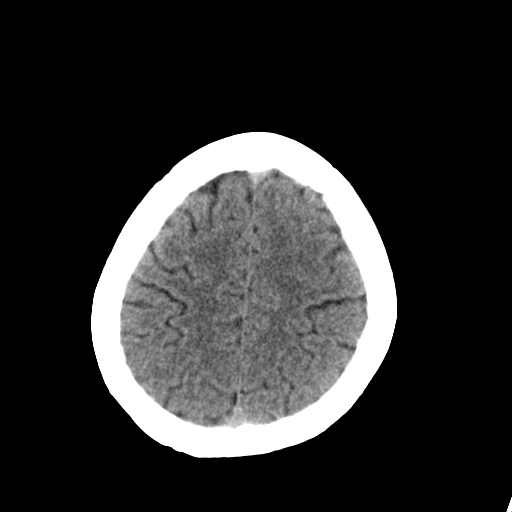
[im 24/32  bone]
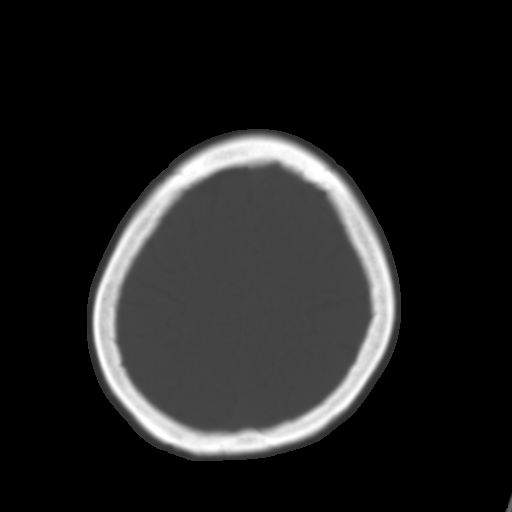
[im 26/32  brain]
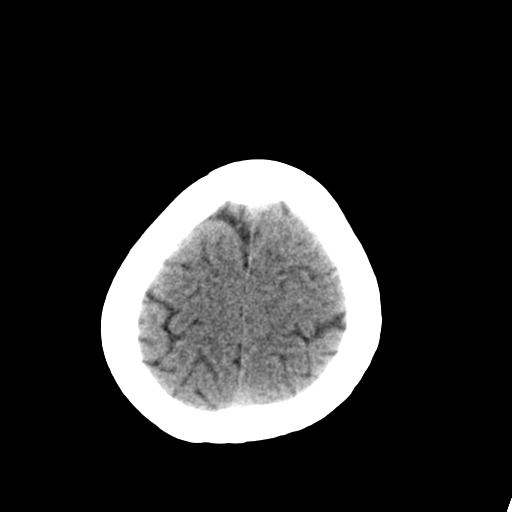
[im 28/32  brain]
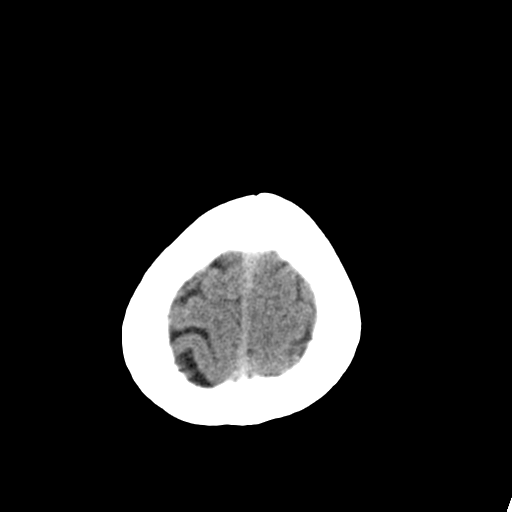
[im 30/32  brain]
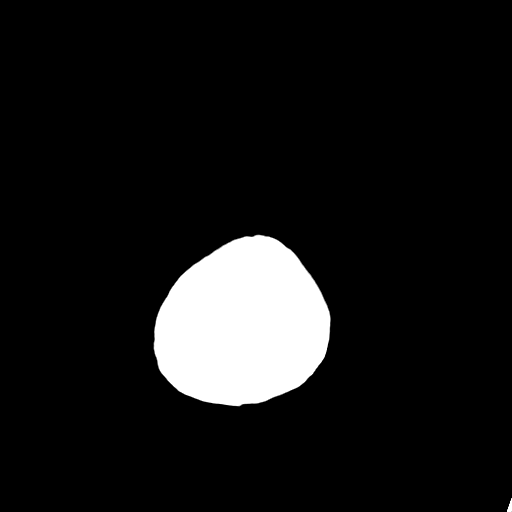

[16 of 30 positions shown; findings below may reference images not displayed]

FINDINGS: Mild cerebral atrophy. No ventricular dilatation. Scattered patchy
areas of low attenuation in the deep white matter probably
representing small vessel ischemia. No mass effect or midline shift.
No abnormal extra-axial fluid collections. Gray-white matter
junctions are distinct. Basal cisterns are not effaced. No evidence
of acute intracranial hemorrhage. No depressed skull fractures.
Visualized paranasal sinuses and mastoid air cells are not
opacified.
IMPRESSION: No acute intracranial abnormalities. Mild atrophy and small vessel
ischemic changes.

## 2015-06-20 DIAGNOSIS — Z8673 Personal history of transient ischemic attack (TIA), and cerebral infarction without residual deficits: Secondary | ICD-10-CM | POA: Insufficient documentation

## 2015-08-26 ENCOUNTER — Emergency Department: Payer: Medicaid Other

## 2015-08-26 ENCOUNTER — Emergency Department
Admission: EM | Admit: 2015-08-26 | Discharge: 2015-08-26 | Disposition: A | Discharge status: Home / Self Care | Payer: Medicaid Other | Attending: Emergency Medicine | Admitting: Emergency Medicine

## 2015-08-26 ENCOUNTER — Encounter: Payer: Self-pay | Admitting: Emergency Medicine

## 2015-08-26 DIAGNOSIS — F17200 Nicotine dependence, unspecified, uncomplicated: Secondary | ICD-10-CM | POA: Insufficient documentation

## 2015-08-26 DIAGNOSIS — Z880 Allergy status to penicillin: Secondary | ICD-10-CM | POA: Insufficient documentation

## 2015-08-26 DIAGNOSIS — I10 Essential (primary) hypertension: Secondary | ICD-10-CM | POA: Diagnosis not present

## 2015-08-26 DIAGNOSIS — M546 Pain in thoracic spine: Secondary | ICD-10-CM | POA: Insufficient documentation

## 2015-08-26 DIAGNOSIS — R0602 Shortness of breath: Secondary | ICD-10-CM | POA: Diagnosis present

## 2015-08-26 DIAGNOSIS — J209 Acute bronchitis, unspecified: Secondary | ICD-10-CM

## 2015-08-26 DIAGNOSIS — J441 Chronic obstructive pulmonary disease with (acute) exacerbation: Secondary | ICD-10-CM | POA: Insufficient documentation

## 2015-08-26 HISTORY — DX: Personal history of other diseases of the musculoskeletal system and connective tissue: Z87.39

## 2015-08-26 HISTORY — DX: Pure hypercholesterolemia, unspecified: E78.00

## 2015-08-26 HISTORY — DX: Bipolar disorder, unspecified: F31.9

## 2015-08-26 HISTORY — DX: Cerebral infarction, unspecified: I63.9

## 2015-08-26 HISTORY — DX: Disorder of arteries and arterioles, unspecified: I77.9

## 2015-08-26 HISTORY — DX: Peripheral vascular disease, unspecified: I73.9

## 2015-08-26 HISTORY — DX: Chronic obstructive pulmonary disease, unspecified: J44.9

## 2015-08-26 LAB — COMPREHENSIVE METABOLIC PANEL
ALBUMIN: 4 g/dL (ref 3.5–5.0)
ALT: 23 U/L (ref 14–54)
ANION GAP: 7 (ref 5–15)
AST: 20 U/L (ref 15–41)
Alkaline Phosphatase: 57 U/L (ref 38–126)
BILIRUBIN TOTAL: 0.1 mg/dL — AB (ref 0.3–1.2)
BUN: 18 mg/dL (ref 6–20)
CHLORIDE: 102 mmol/L (ref 101–111)
CO2: 25 mmol/L (ref 22–32)
Calcium: 9.8 mg/dL (ref 8.9–10.3)
Creatinine, Ser: 0.73 mg/dL (ref 0.44–1.00)
GFR calc Af Amer: >60 mL/min (ref >60)
GFR calc non Af Amer: >60 mL/min (ref >60)
GLUCOSE: 157 mg/dL — AB (ref 65–99)
POTASSIUM: 5.2 mmol/L — AB (ref 3.5–5.1)
Sodium: 134 mmol/L — ABNORMAL LOW (ref 135–145)
TOTAL PROTEIN: 7.5 g/dL (ref 6.5–8.1)

## 2015-08-26 LAB — CBC
HEMATOCRIT: 48 % — AB (ref 35.0–47.0)
HEMOGLOBIN: 16.3 g/dL — AB (ref 12.0–16.0)
MCH: 29.1 pg (ref 26.0–34.0)
MCHC: 34 g/dL (ref 32.0–36.0)
MCV: 85.5 fL (ref 80.0–100.0)
Platelets: 142 10*3/uL — ABNORMAL LOW (ref 150–440)
RBC: 5.61 MIL/uL — AB (ref 3.80–5.20)
RDW: 14.3 % (ref 11.5–14.5)
WBC: 3.6 10*3/uL (ref 3.6–11.0)

## 2015-08-26 LAB — URINALYSIS COMPLETE WITH MICROSCOPIC (ARMC ONLY)
Bilirubin Urine: NEGATIVE
GLUCOSE, UA: NEGATIVE mg/dL
Ketones, ur: NEGATIVE mg/dL
Leukocytes, UA: NEGATIVE
NITRITE: NEGATIVE
PROTEIN: NEGATIVE mg/dL
SPECIFIC GRAVITY, URINE: 1.011 (ref 1.005–1.030)
pH: 5 (ref 5.0–8.0)

## 2015-08-26 LAB — TROPONIN I: Troponin I: <0.03 ng/mL (ref <0.031)

## 2015-08-26 IMAGING — CT CT ANGIO NECK
2 of 7 series · 9 of 33 positions shown · IV contrast (APPLIED)
Comparison: Carotid ultrasound 12/14/2014

CLINICAL DATA: Bilateral carotid artery stenosis.

EXAM:
CT ANGIOGRAPHY NECK
TECHNIQUE: Multidetector CT imaging of the neck was performed using the
standard protocol during bolus administration of intravenous
contrast. Multiplanar CT image reconstructions and MIPs were
obtained to evaluate the vascular anatomy. Carotid stenosis
measurements (when applicable) are obtained utilizing NASCET
criteria, using the distal internal carotid diameter as the
denominator.
CONTRAST:  80mL OMNIPAQUE IOHEXOL 350 MG/ML SOLN

[Series 4: cta neck · axial · 0.48mm/px · z∈[-342,-228]mm · 3 of 153 slices shown]
[im 39/153  soft-tissue]
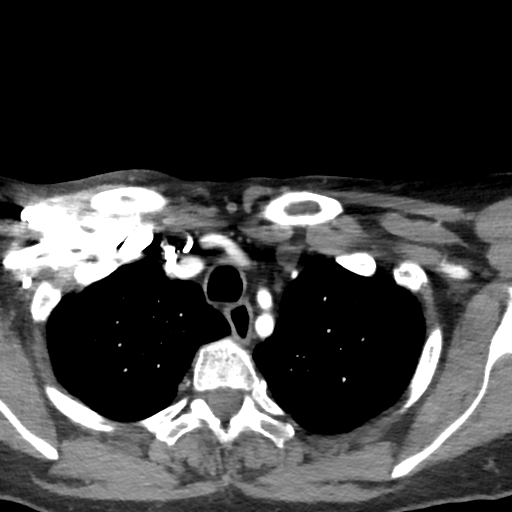
[im 77/153  soft-tissue]
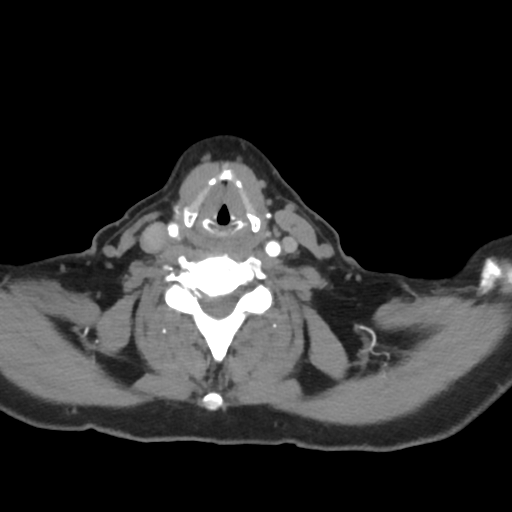
[im 115/153  soft-tissue]
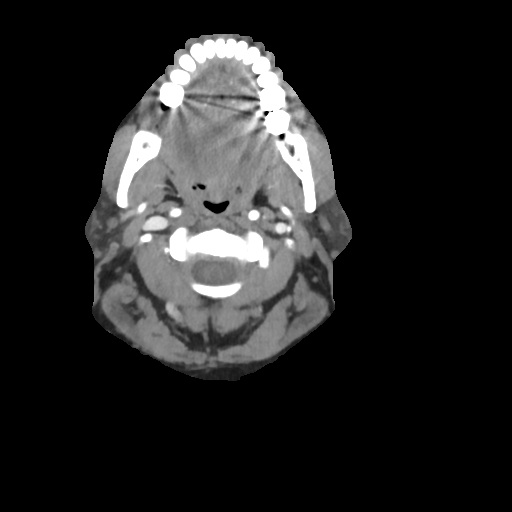

[Series 5: ax thin · axial · 0.39mm/px · z∈[-367,-204]mm · 6 of 229 slices shown]
[im 33/229  soft-tissue]
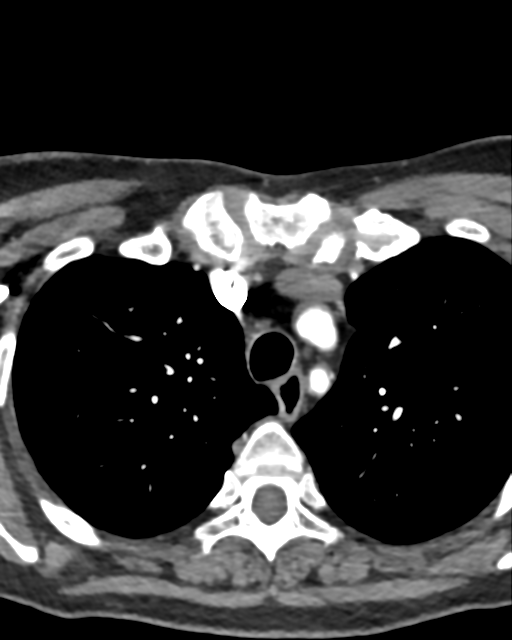
[im 66/229  bone]
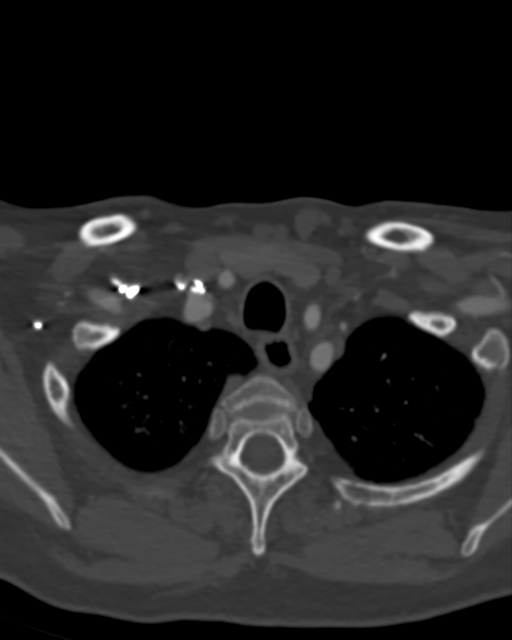
[im 98/229  soft-tissue]
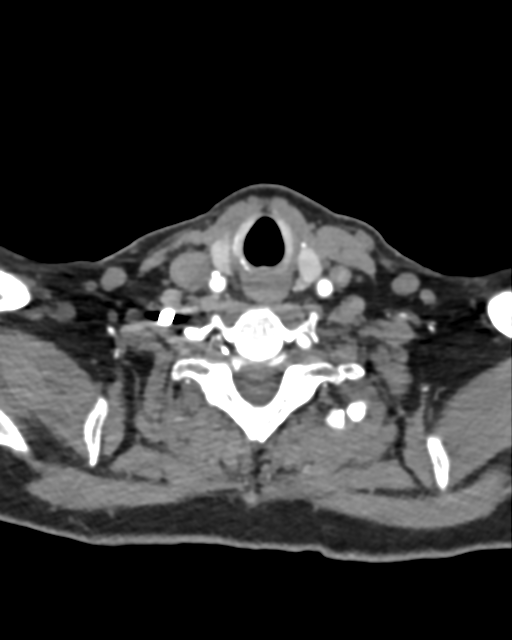
[im 131/229  bone]
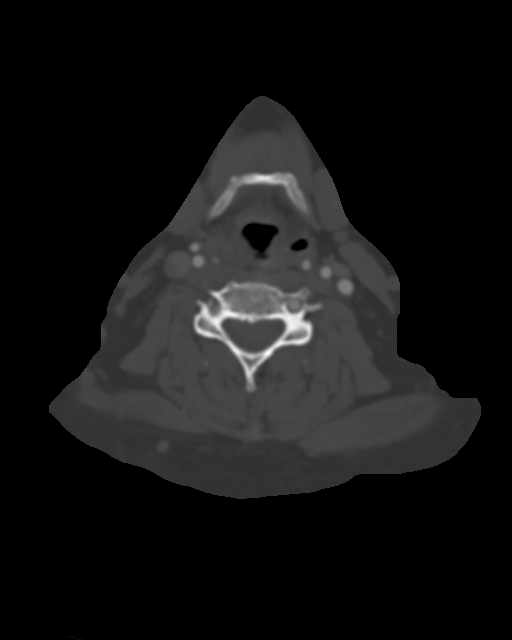
[im 163/229  soft-tissue]
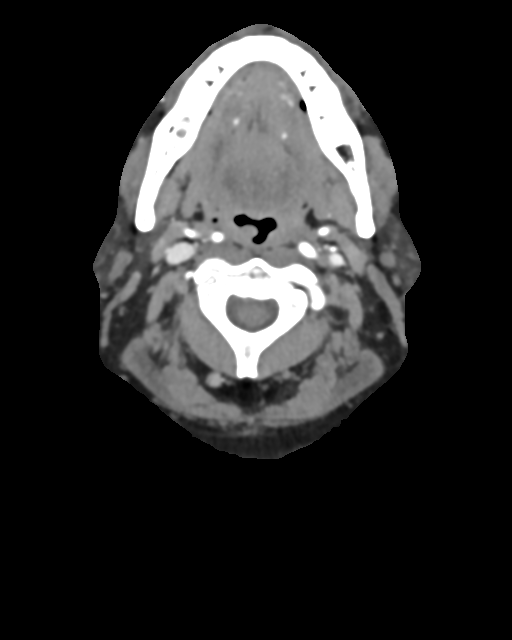
[im 196/229  bone]
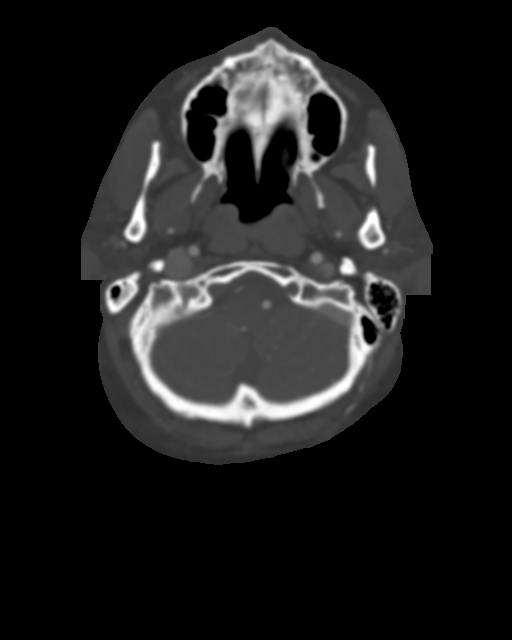

[9 of 33 positions shown; findings below may reference images not displayed]

FINDINGS: Aortic arch: Normal variant aortic arch branching pattern with
common origin of the brachiocephalic and left common carotid
arteries noted. Mild plaque in the brachiocephalic and right
subclavian arteries does not result in significant stenosis.
Moderate plaque in proximal left subclavian artery results in
slightly less than 50% stenosis.

Right carotid system: Common carotid artery is patent with
mild-to-moderate calcified plaque in its mid and distal portions
without significant stenosis. Mixed calcified and noncalcified
plaque at the carotid bifurcation and proximal ICA results in
approximately 50% stenosis at the ICA origin. There is also mild
narrowing of the ECA origin.

Left carotid system: Common carotid artery is patent with mild
plaque in significant stenosis. Mild calcified and noncalcified
plaque in the proximal ICA results in less than 50% narrowing.
Remainder of the cervical ICA is unremarkable.

Vertebral arteries: Vertebral arteries are patent without stenosis.
Left vertebral artery is dominant.

Skeleton: Mild multilevel cervical spondylosis.

Other neck: Mild centrilobular emphysema in the lung apices. Mild
biapical scarring.
IMPRESSION: 1. 50% proximal right ICA stenosis.
2. Mild plaque at the left carotid bifurcation without significant
stenosis.
3. Patent vertebral arteries without stenosis.

## 2015-08-26 MED ORDER — LEVOFLOXACIN 750 MG PO TABS: 750.0000 mg | ORAL_TABLET | Freq: Every day | ORAL | Status: DC

## 2015-08-26 MED ORDER — HYDROCODONE-ACETAMINOPHEN 5-325 MG PO TABS
1.0000 | ORAL_TABLET | Freq: Four times a day (QID) | ORAL | Status: DC | PRN
Start: 2015-08-26 — End: 2021-08-24

## 2015-08-26 MED ORDER — IBUPROFEN 600 MG PO TABS
600.0000 mg | ORAL_TABLET | Freq: Once | ORAL | Status: AC
  Administered 2015-08-26: 600 mg via ORAL
  Filled 2015-08-26: qty 1

## 2015-08-26 NOTE — ED Notes (Addendum)
Pt c/o upper and lower back pain, shortness of breath, cough, chills, and sweating for 1 week.  Has also had diarrhea.

## 2015-08-26 NOTE — ED Provider Notes (Signed)
Time Seen: Approximately 1800 I have reviewed the triage notes  Chief Complaint: Back Pain and Shortness of Breath   History of Present Illness: Sylvia Richardson is a 59 y.o. female who presents after a one-week history of first a cough and then started having some upper back pain. She states when she coughs it aggravates the discomfort. She denies any anterior chest pain. She states her cough has been productive with some yellow tinged sputum without any hemoptysis or wheezing. Patient's had some nausea with no vomiting. She denies any true shortness of breath, pulmonary emboli risk factors or peripheral edema. She's had some mild loose stool without any true diarrhea. Patient has a long history of tobacco usage and smokes approximately a pack and half of cigarettes per day. She is on home oxygen as needed. She denies any wheezing at home. She denies any arm job back or flank discomfort. Past Medical History  Diagnosis Date  . Hypertension   . COPD (chronic obstructive pulmonary disease) ([place])   . H/O degenerative disc disease   . Hypercholesteremia   . Bipolar disorder ([place])   . Stroke ([place])   . Carotid artery disease ([place])     There are no active problems to display for this patient.   Past Surgical History  Procedure Laterality Date  . Tubal ligation    . Tonsillectomy    . Cesarean section      Past Surgical History  Procedure Laterality Date  . Tubal ligation    . Tonsillectomy    . Cesarean section      Current Outpatient Rx  Name  Route  Sig  Dispense  Refill  . HYDROcodone-acetaminophen (NORCO) 5-325 MG tablet   Oral   Take 1 tablet by mouth every 6 (six) hours as needed for moderate pain.   20 tablet   0   . levofloxacin (LEVAQUIN) 750 MG tablet   Oral   Take 1 tablet (750 mg total) by mouth daily.   7 tablet   0     Allergies:  Ciprofloxacin and Penicillins  Family History: History reviewed. No pertinent family history.  Social  History: Social History  Substance Use Topics  . Smoking status: Current Every Day Smoker  . Smokeless tobacco: None  . Alcohol Use: No     Review of Systems:   10 point review of systems was performed and was otherwise negative:  Constitutional: No fever Eyes: No visual disturbances ENT: No sore throat, ear pain Cardiac: No chest pain Respiratory: No shortness of breath, wheezing, or stridor Abdomen: No abdominal pain, no vomiting, No true diarrhea Endocrine: No weight loss, No night sweats Extremities: No peripheral edema, cyanosis Skin: No rashes, easy bruising Neurologic: No focal weakness, trouble with speech or swollowing Urologic: No dysuria, Hematuria, or urinary frequency   Physical Exam:  ED Triage Vitals  Enc Vitals Group     BP 08/26/15 1456 136/48 mmHg     Pulse Rate 08/26/15 1456 87     Resp 08/26/15 1456 20     Temp 08/26/15 1456 98.5 F (36.9 C)     Temp Source 08/26/15 1456 Oral     SpO2 08/26/15 1456 96 %     Weight 08/26/15 1456 155 lb (70.308 kg)     Height 08/26/15 1456 5\' 4"  (1.626 m)     Head Cir --      Peak Flow --      Pain Score 08/26/15 1453 10  Pain Loc --      Pain Edu? --      Excl. in [place]? --     General: Awake , Alert , and Oriented times 3; GCS 15 Head: Normal cephalic , atraumatic Eyes: Pupils equal , round, reactive to light Nose/Throat: No nasal drainage, patent upper airway without erythema or exudate.  Neck: Supple, Full range of motion, No anterior adenopathy or palpable thyroid masses Lungs: Clear to ascultation without wheezes , rhonchi, or rales Heart: Regular rate, regular rhythm without murmurs , gallops , or rubs Abdomen: Soft, non tender without rebound, guarding , or rigidity; bowel sounds positive and symmetric in all 4 quadrants. No organomegaly .        Extremities: 2 plus symmetric pulses. No edema, clubbing or cyanosis Neurologic: normal ambulation, Motor symmetric without deficits, sensory intact Skin:  warm, dry, no rashes Patient has point tenderness at approximately T3 through T5 region posteriorly. Palpation here does reproduce the patient's discomfort.  Labs:   All laboratory work was reviewed including any pertinent negatives or positives listed below:  Labs Reviewed  [place] ([place]) - Abnormal; Notable for the following:    Color, Urine YELLOW (*)    APPearance CLEAR (*)    Hgb urine dipstick 2+ (*)    Bacteria, UA RARE (*)    Squamous Epithelial / LPF 0-5 (*)    All other components within normal limits  CBC - Abnormal; Notable for the following:    RBC 5.61 (*)    Hemoglobin 16.3 (*)    HCT 48.0 (*)    Platelets 142 (*)    All other components within normal limits  COMPREHENSIVE METABOLIC PANEL - Abnormal; Notable for the following:    Sodium 134 (*)    Potassium 5.2 (*)    Glucose, Bld 157 (*)    Total Bilirubin 0.1 (*)    All other components within normal limits  TROPONIN I   laboratory work was reviewed with no significant abnormalities  EKG:  ED ECG REPORT I, Daymon Larsen, the attending physician, personally viewed and interpreted this ECG.  Date: 08/26/2015 EKG Time: 1504 Rate: 87 Rhythm: normal sinus rhythm QRS Axis: normal Intervals: normal ST/T Wave abnormalities: normal Conduction Disutrbances: none Narrative Interpretation: unremarkable Poor R-wave progression in the anterior leads. No ischemic changes noted   Radiology:   CLINICAL DATA: Shortness of breath for 1 week  EXAM: CHEST 2 VIEW  COMPARISON: March 06, 2015  FINDINGS: There is stable mild elevation of the right hemidiaphragm. There is no edema or consolidation. The heart size and pulmonary vascularity are normal. No adenopathy. There is slight degenerative change in the thoracic spine.  IMPRESSION: No edema or consolidation.     I personally reviewed the radiologic studies     ED Course: * Given the patient's current clinical  presentation and objective findings I felt this was unlikely to be cardiac in nature, thoracic dissection, metastatic cancer, pneumonia, etc. She does have a history of COPD and appears to have some acute bronchitis without any bronchospasm. Her pulse oximetries remained adequate for the patient and she does not have any wheezing on exam. I felt the patient could be treated on an outpatient basis and she states that actually [place] works well for her and she has been on it in the past. He is not sure what her allergy to ciprofloxacin is. She was also prescribed Norco for pain but was advised she can also take over-the-counter Tylenol and  ibuprofen and I felt the Norco would help her with the coughing spells.    Assessment:  Acute bronchitis History of COPD Musculoskeletal back pain  Final Clinical Impression:   Final diagnoses:  Acute bronchitis, unspecified organism     Plan:  Outpatient management Patient was advised to return immediately if condition worsens. Patient was advised to follow up with their primary care physician or other specialized physicians involved in their outpatient care             Daymon Larsen, MD 08/26/15 1906

## 2015-08-26 NOTE — ED Notes (Signed)
Patient states that she has been having pain in her upper back for 1 week. Pain has been constant but today it got worse. Patient reports taking 3 10 mg tablets of Prednisone that she had at home today to see if that would help with the pain. Patient is also c/o fever, chills, and sweats. Patient unsure of how high her temperature has been.

## 2015-08-26 NOTE — Discharge Instructions (Signed)

## 2015-09-04 ENCOUNTER — Other Ambulatory Visit: Payer: Self-pay | Admitting: Internal Medicine

## 2015-09-04 ENCOUNTER — Ambulatory Visit
Admission: RE | Admit: 2015-09-04 | Discharge: 2015-09-04 | Disposition: A | Discharge status: Home / Self Care | Payer: Medicaid Other | Source: Ambulatory Visit | Attending: Internal Medicine | Admitting: Internal Medicine

## 2015-09-04 DIAGNOSIS — R05 Cough: Secondary | ICD-10-CM | POA: Diagnosis not present

## 2015-09-04 DIAGNOSIS — R059 Cough, unspecified: Secondary | ICD-10-CM

## 2015-09-26 IMAGING — CR DG CHEST 2V
1 series · 2 of 2 positions shown · non-contrast
Comparison: 05/02/2014

CLINICAL DATA: Cough, upper back pain, chest pain for 1 month

EXAM:
CHEST  2 VIEW

[Series 1: dg chest 2 view · 0.14mm/px · 2 of 2 slices shown]
[im 1/2]
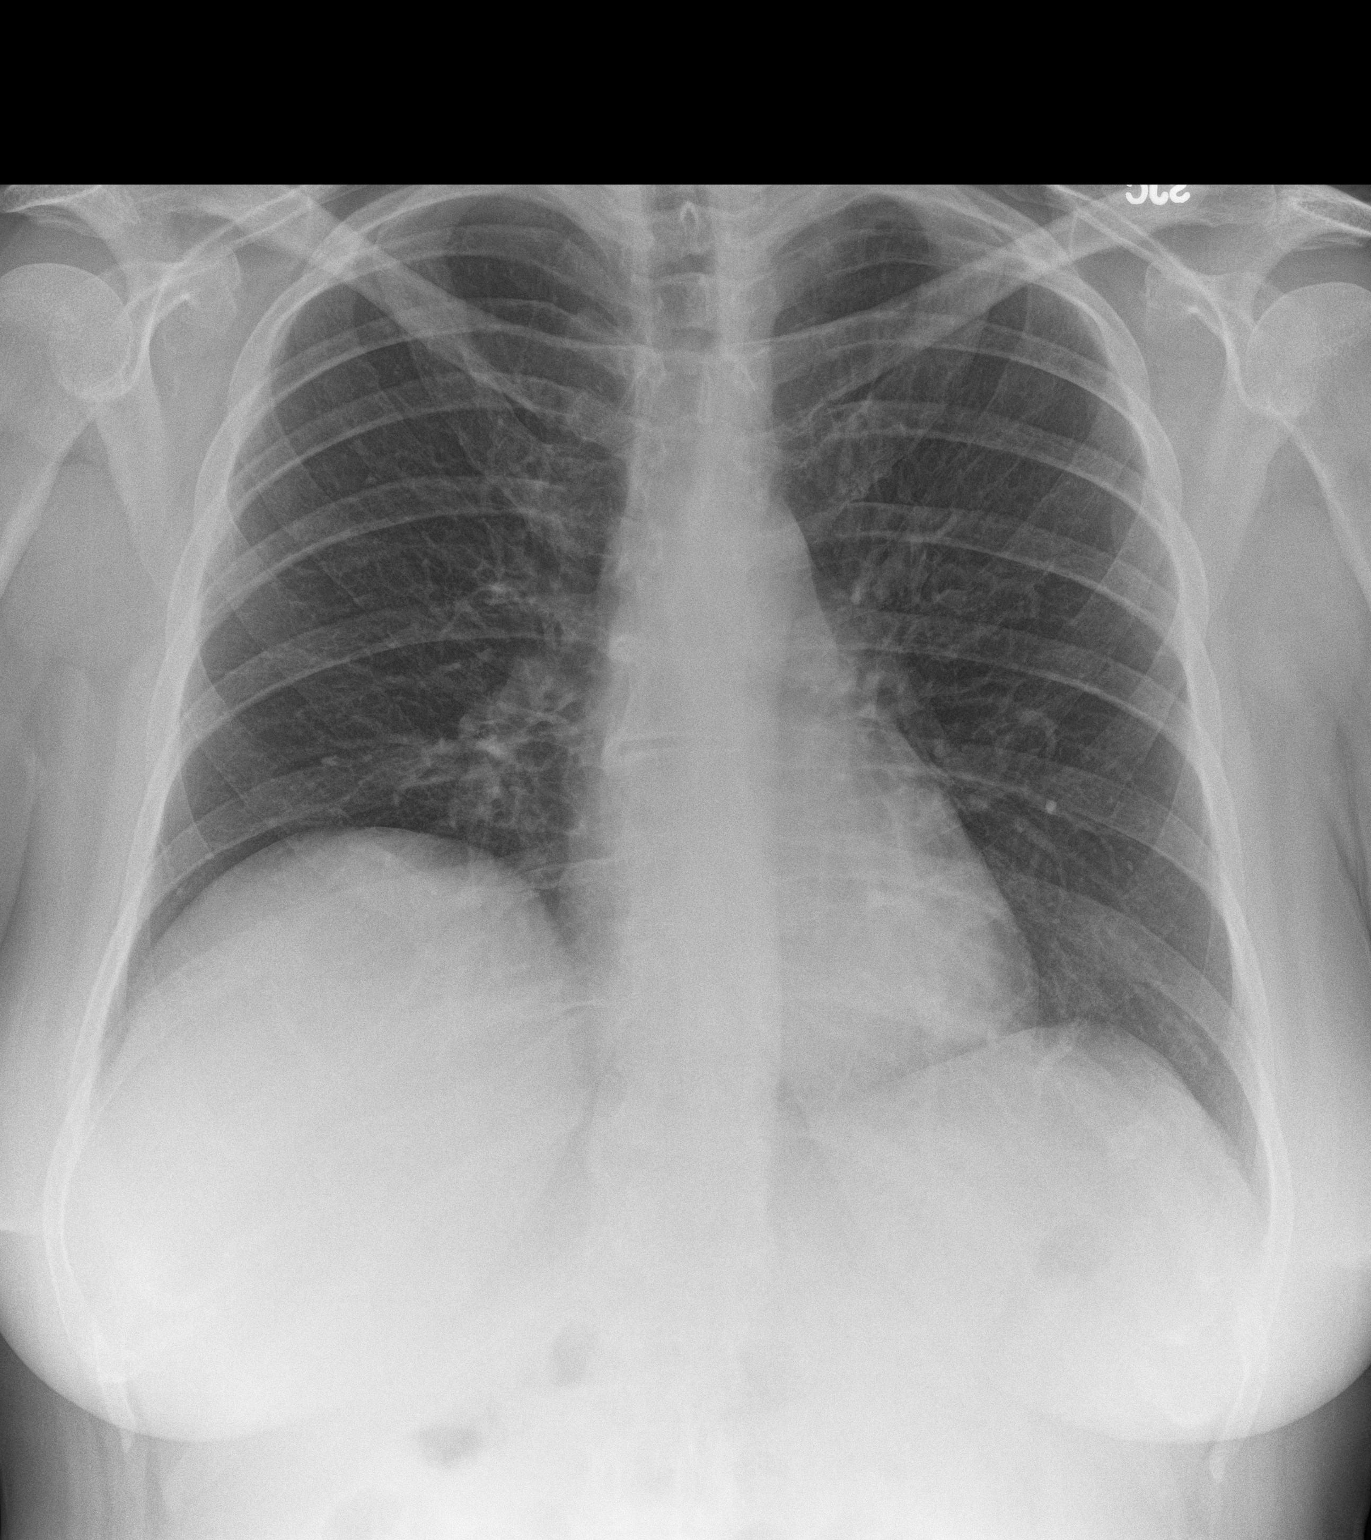
[im 2/2]
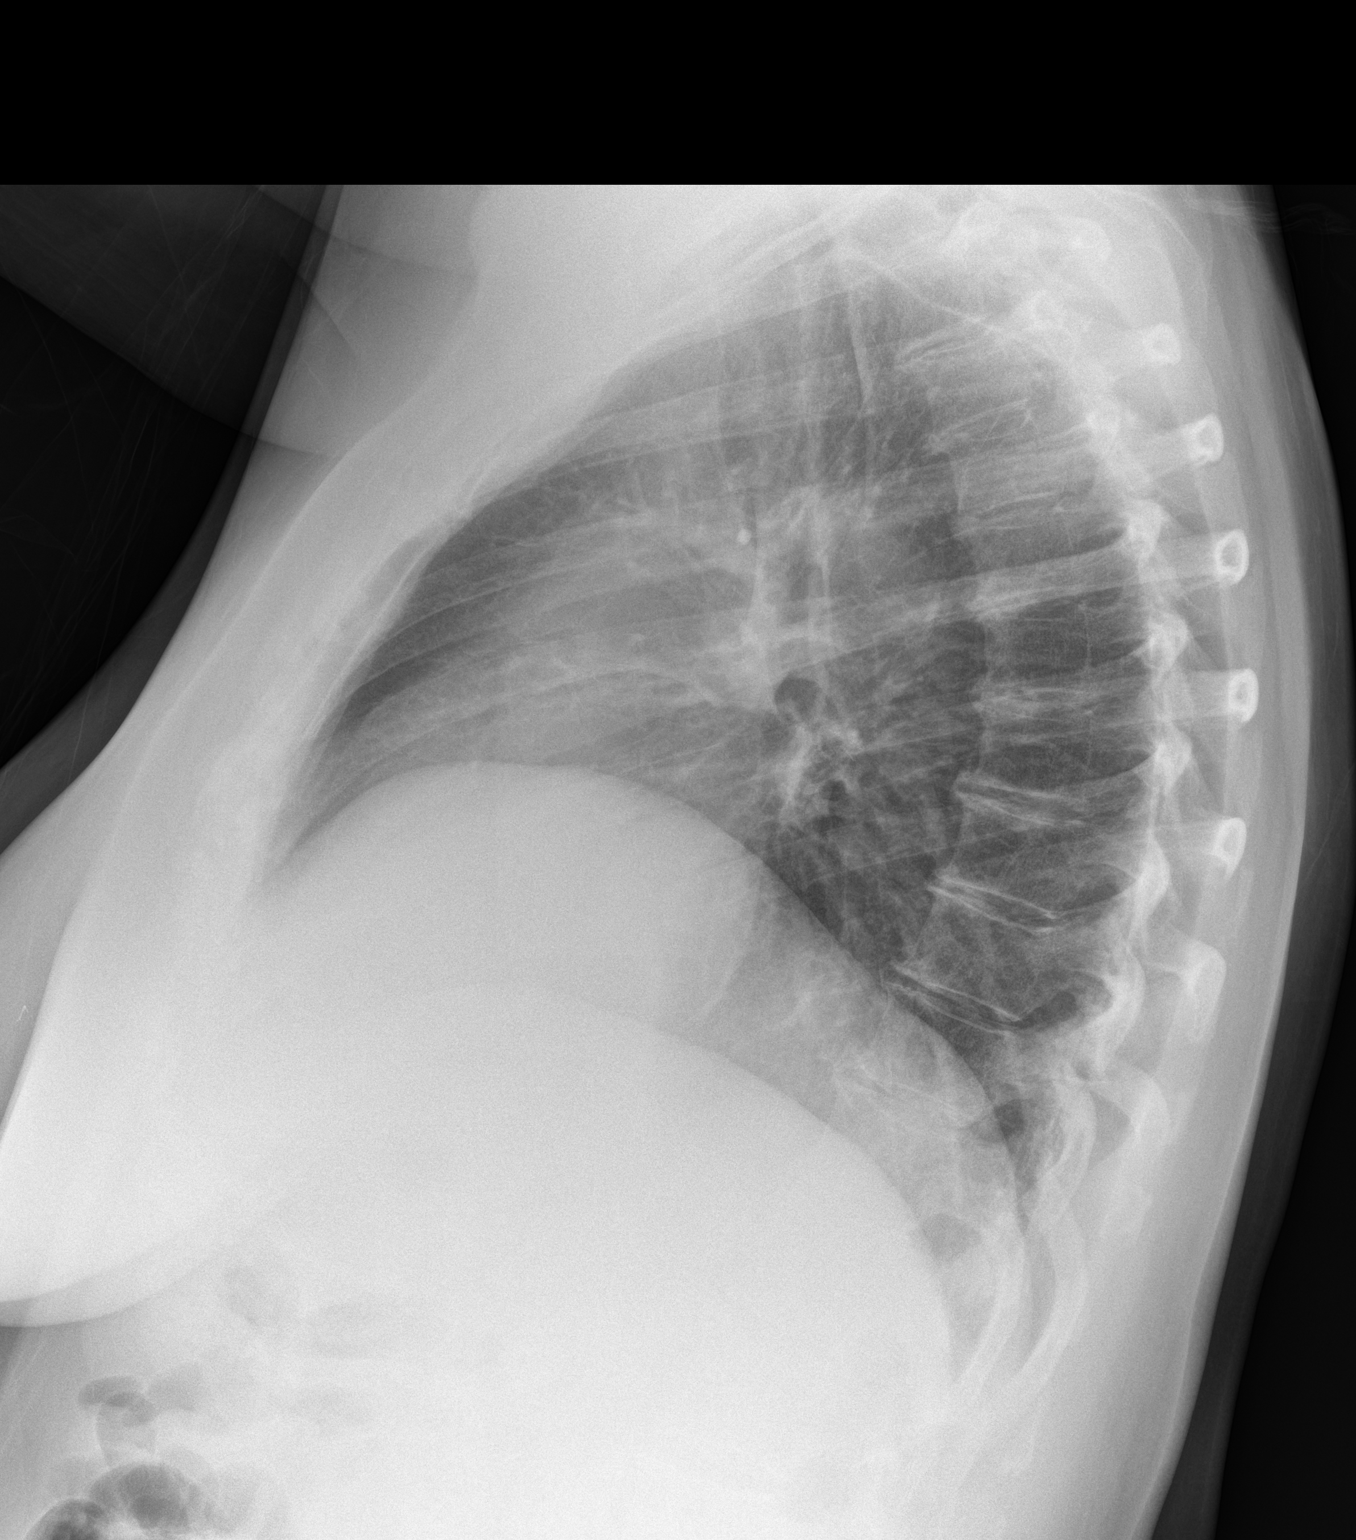

[2 of 2 positions shown; findings below may reference images not displayed]

FINDINGS: Cardiomediastinal silhouette is stable. There is chronic elevation
of the right hemidiaphragm. No acute infiltrate or pleural effusion.
No pulmonary edema. Stable mild degenerative changes thoracic spine.
IMPRESSION: No active cardiopulmonary disease. Stable chronic elevation of the
right hemidiaphragm.

## 2016-01-23 DIAGNOSIS — N200 Calculus of kidney: Secondary | ICD-10-CM | POA: Insufficient documentation

## 2016-11-11 ENCOUNTER — Other Ambulatory Visit: Payer: Self-pay | Admitting: Pediatrics

## 2016-11-11 DIAGNOSIS — Z1239 Encounter for other screening for malignant neoplasm of breast: Secondary | ICD-10-CM

## 2017-01-08 ENCOUNTER — Ambulatory Visit
Admission: RE | Admit: 2017-01-08 | Discharge: 2017-01-08 | Disposition: A | Discharge status: Home / Self Care | Payer: Medicaid Other | Source: Ambulatory Visit | Attending: Pediatrics | Admitting: Pediatrics

## 2017-01-08 DIAGNOSIS — Z1231 Encounter for screening mammogram for malignant neoplasm of breast: Secondary | ICD-10-CM | POA: Diagnosis present

## 2017-01-08 DIAGNOSIS — Z1239 Encounter for other screening for malignant neoplasm of breast: Secondary | ICD-10-CM

## 2017-01-08 HISTORY — DX: Malignant (primary) neoplasm, unspecified: C80.1

## 2017-02-14 ENCOUNTER — Ambulatory Visit (INDEPENDENT_AMBULATORY_CARE_PROVIDER_SITE_OTHER): Payer: Medicaid Other | Admitting: Vascular Surgery

## 2017-02-14 ENCOUNTER — Other Ambulatory Visit (INDEPENDENT_AMBULATORY_CARE_PROVIDER_SITE_OTHER): Payer: Medicaid Other

## 2017-02-14 ENCOUNTER — Other Ambulatory Visit (INDEPENDENT_AMBULATORY_CARE_PROVIDER_SITE_OTHER): Payer: Self-pay | Admitting: Vascular Surgery

## 2017-02-14 VITALS — BP 114/64 | HR 70 | Resp 16 | Wt 147.6 lb

## 2017-02-14 DIAGNOSIS — I6523 Occlusion and stenosis of bilateral carotid arteries: Secondary | ICD-10-CM

## 2017-02-14 DIAGNOSIS — I10 Essential (primary) hypertension: Secondary | ICD-10-CM

## 2017-02-14 NOTE — Progress Notes (Signed)
Subjective:    Patient ID: Sylvia Richardson, female    DOB: 1956-04-19, 61 y.o.   MRN: [id number] Chief Complaint  Patient presents with  . Follow-up   Patient presents for yearly carotid stenosis follow-up. Stenosis has been followed by surveillance duplexes. The patient underwent a carotid duplex exam that was notable for 40-59% stenosis of the bilateral internal carotid arteries when compared to the previous exam on 02/13/2016 there has been a slight increase percentage grade stenosis bilaterally. The patient is without complaint today. The patient denies amaurosis fugax, TIA like symptoms or focal motor deficits.    Review of Systems  Constitutional: Negative.   HENT: Negative.   Eyes: Negative.   Respiratory: Negative.   Cardiovascular: Negative.   Gastrointestinal: Negative.   Endocrine: Negative.   Genitourinary: Negative.   Musculoskeletal: Negative.   Skin: Negative.   Allergic/Immunologic: Negative.   Neurological: Negative.   Hematological: Negative.   Psychiatric/Behavioral: Negative.       Objective:   Physical Exam  Constitutional: She is oriented to person, place, and time. She appears well-developed and well-nourished. No distress.  HENT:  Head: Normocephalic and atraumatic.  Eyes: Conjunctivae are normal. Pupils are equal, round, and reactive to light.  Neck: Normal range of motion.  No carotid bruits noted  Cardiovascular: Normal rate, regular rhythm, normal heart sounds and intact distal pulses.   Pulses:      Radial pulses are 2+ on the right side, and 2+ on the left side.  Pulmonary/Chest: Effort normal.  Musculoskeletal: Normal range of motion. She exhibits no edema.  Neurological: She is alert and oriented to person, place, and time.  Skin: Skin is warm and dry. She is not diaphoretic.  Psychiatric: She has a normal mood and affect. Her behavior is normal. Judgment and thought content normal.  Vitals reviewed.  BP 114/64   Pulse 70   Resp  16   Wt 147 lb 9.6 oz (67 kg)   BMI 25.34 kg/m   Past Medical History:  Diagnosis Date  . Bipolar disorder ([place])   . Cancer ([place])    skin  . Carotid artery disease ([place])   . COPD (chronic obstructive pulmonary disease) ([place])   . H/O degenerative disc disease   . Hypercholesteremia   . Hypertension   . Stroke [facility])     Social History   Social History  . Marital status: Divorced    Spouse name: N/A  . Number of children: N/A  . Years of education: N/A   Occupational History  . Not on file.   Social History Main Topics  . Smoking status: Current Every Day Smoker  . Smokeless tobacco: Not on file  . Alcohol use No  . Drug use: No  . Sexual activity: Not on file   Other Topics Concern  . Not on file   Social History Narrative  . No narrative on file    Past Surgical History:  Procedure Laterality Date  . BREAST BIOPSY Left    core bx- neg  . CESAREAN SECTION    . TONSILLECTOMY    . TUBAL LIGATION      Family History  Problem Relation Age of Onset  . Breast cancer Neg Hx     Allergies  Allergen Reactions  . Ciprofloxacin Anaphylaxis  . Penicillins Rash       Assessment & Plan:  Patient presents for yearly carotid stenosis follow-up. Stenosis has been followed by surveillance duplexes. The patient underwent a carotid duplex  exam that was notable for 40-59% stenosis of the bilateral internal carotid arteries when compared to the previous exam on 02/13/2016 there has been a slight increase percentage grade stenosis bilaterally. The patient is without complaint today. The patient denies amaurosis fugax, TIA like symptoms or focal motor deficits.   1. Bilateral carotid artery stenosis -Stable Studies reviewed with patient. Patient asymptomatic with stable duplex.  No intervention at this time.  Patient to return in 1 year for surveillance carotid duplex. Patient to remain abstinent of tobacco use. I have discussed with the patient at length the risk  factors for and pathogenesis of atherosclerotic disease and encouraged a healthy diet, regular exercise regimen and blood pressure / glucose control.  Patient was instructed to contact our office in the interim with problems such as arm / leg weakness or numbness, speech / swallowing difficulty or temporary monocular blindness. The patient expresses their understanding.   2. Essential hypertension -stable Encouraged good control as its slows the progression of atherosclerotic disease   Current Outpatient Prescriptions on File Prior to Visit  Medication Sig Dispense Refill  . HYDROcodone-acetaminophen (NORCO) 5-325 MG tablet Take 1 tablet by mouth every 6 (six) hours as needed for moderate pain. (Patient not taking: Reported on 02/14/2017) 20 tablet 0  . levofloxacin (LEVAQUIN) 750 MG tablet Take 1 tablet (750 mg total) by mouth daily. (Patient not taking: Reported on 02/14/2017) 7 tablet 0   No current facility-administered medications on file prior to visit.     There are no Patient Instructions on file for this visit. No Follow-up on file.   Safiyya Stokes A Shavar Gorka, PA-C

## 2017-02-18 LAB — VAS US CAROTID
LCCAPDIAS: 11 cm/s
LEFT ECA DIAS: -12 cm/s
LEFT VERTEBRAL DIAS: 13 cm/s
LICAPDIAS: 35 cm/s
Left CCA dist dias: -19 cm/s
Left CCA dist sys: -80 cm/s
Left CCA prox sys: 68 cm/s
Left ICA dist dias: -19 cm/s
Left ICA dist sys: -74 cm/s
Left ICA prox sys: 149 cm/s
RCCAPSYS: 59 cm/s
RIGHT CCA MID DIAS: 14 cm/s
RIGHT ECA DIAS: -16 cm/s
RIGHT VERTEBRAL DIAS: 5 cm/s
Right CCA prox dias: 13 cm/s
Right cca dist sys: -60 cm/s

## 2017-07-28 ENCOUNTER — Ambulatory Visit
Admission: RE | Admit: 2017-07-28 | Discharge: 2017-07-28 | Disposition: A | Discharge status: Home / Self Care | Payer: Medicaid Other | Source: Ambulatory Visit | Attending: Nurse Practitioner | Admitting: Nurse Practitioner

## 2017-07-28 ENCOUNTER — Other Ambulatory Visit: Payer: Self-pay | Admitting: Nurse Practitioner

## 2017-07-28 DIAGNOSIS — R071 Chest pain on breathing: Secondary | ICD-10-CM

## 2017-07-28 DIAGNOSIS — R079 Chest pain, unspecified: Secondary | ICD-10-CM | POA: Diagnosis not present

## 2017-08-04 DIAGNOSIS — L900 Lichen sclerosus et atrophicus: Secondary | ICD-10-CM | POA: Insufficient documentation

## 2017-08-14 ENCOUNTER — Other Ambulatory Visit: Payer: Self-pay | Admitting: Sports Medicine

## 2017-08-14 DIAGNOSIS — M47816 Spondylosis without myelopathy or radiculopathy, lumbar region: Secondary | ICD-10-CM

## 2017-08-14 DIAGNOSIS — R29898 Other symptoms and signs involving the musculoskeletal system: Secondary | ICD-10-CM

## 2017-08-21 ENCOUNTER — Ambulatory Visit
Admission: RE | Admit: 2017-08-21 | Discharge: 2017-08-21 | Disposition: A | Discharge status: Home / Self Care | Payer: Medicaid Other | Source: Ambulatory Visit | Attending: Sports Medicine | Admitting: Sports Medicine

## 2017-08-21 DIAGNOSIS — M479 Spondylosis, unspecified: Secondary | ICD-10-CM | POA: Insufficient documentation

## 2017-08-21 DIAGNOSIS — M5126 Other intervertebral disc displacement, lumbar region: Secondary | ICD-10-CM | POA: Insufficient documentation

## 2017-08-21 DIAGNOSIS — R29898 Other symptoms and signs involving the musculoskeletal system: Secondary | ICD-10-CM

## 2017-08-21 DIAGNOSIS — M47816 Spondylosis without myelopathy or radiculopathy, lumbar region: Secondary | ICD-10-CM

## 2017-09-04 ENCOUNTER — Other Ambulatory Visit: Payer: Self-pay | Admitting: Internal Medicine

## 2017-09-04 MED ORDER — DALIRESP 500 MCG PO TABS: 500.0000 ug | ORAL_TABLET | Freq: Every day | ORAL | 6 refills | Status: DC

## 2017-10-28 ENCOUNTER — Ambulatory Visit: Payer: Self-pay | Admitting: Internal Medicine

## 2017-11-12 ENCOUNTER — Other Ambulatory Visit: Payer: Self-pay

## 2017-11-12 ENCOUNTER — Emergency Department
Admission: EM | Admit: 2017-11-12 | Discharge: 2017-11-12 | Disposition: A | Discharge status: Home / Self Care | Payer: Medicaid Other | Attending: Emergency Medicine | Admitting: Emergency Medicine

## 2017-11-12 DIAGNOSIS — F17200 Nicotine dependence, unspecified, uncomplicated: Secondary | ICD-10-CM | POA: Insufficient documentation

## 2017-11-12 DIAGNOSIS — J449 Chronic obstructive pulmonary disease, unspecified: Secondary | ICD-10-CM | POA: Insufficient documentation

## 2017-11-12 DIAGNOSIS — R109 Unspecified abdominal pain: Secondary | ICD-10-CM

## 2017-11-12 DIAGNOSIS — Z79899 Other long term (current) drug therapy: Secondary | ICD-10-CM | POA: Diagnosis not present

## 2017-11-12 DIAGNOSIS — R197 Diarrhea, unspecified: Secondary | ICD-10-CM | POA: Diagnosis not present

## 2017-11-12 DIAGNOSIS — I10 Essential (primary) hypertension: Secondary | ICD-10-CM | POA: Insufficient documentation

## 2017-11-12 DIAGNOSIS — Z85828 Personal history of other malignant neoplasm of skin: Secondary | ICD-10-CM | POA: Diagnosis not present

## 2017-11-12 DIAGNOSIS — Z7982 Long term (current) use of aspirin: Secondary | ICD-10-CM | POA: Insufficient documentation

## 2017-11-12 DIAGNOSIS — R1084 Generalized abdominal pain: Secondary | ICD-10-CM | POA: Insufficient documentation

## 2017-11-12 LAB — COMPREHENSIVE METABOLIC PANEL
ALT: 46 U/L (ref 14–54)
AST: 33 U/L (ref 15–41)
Albumin: 3.5 g/dL (ref 3.5–5.0)
Alkaline Phosphatase: 91 U/L (ref 38–126)
Anion gap: 6 (ref 5–15)
BUN: 14 mg/dL (ref 6–20)
CHLORIDE: 102 mmol/L (ref 101–111)
CO2: 25 mmol/L (ref 22–32)
Calcium: 9.2 mg/dL (ref 8.9–10.3)
Creatinine, Ser: 0.61 mg/dL (ref 0.44–1.00)
Glucose, Bld: 120 mg/dL — ABNORMAL HIGH (ref 65–99)
POTASSIUM: 4.1 mmol/L (ref 3.5–5.1)
Sodium: 133 mmol/L — ABNORMAL LOW (ref 135–145)
Total Bilirubin: 0.4 mg/dL (ref 0.3–1.2)
Total Protein: 6.8 g/dL (ref 6.5–8.1)

## 2017-11-12 LAB — CBC
HEMATOCRIT: 43 % (ref 35.0–47.0)
Hemoglobin: 14.5 g/dL (ref 12.0–16.0)
MCH: 29.3 pg (ref 26.0–34.0)
MCHC: 33.6 g/dL (ref 32.0–36.0)
MCV: 87.2 fL (ref 80.0–100.0)
Platelets: 189 10*3/uL (ref 150–440)
RBC: 4.94 MIL/uL (ref 3.80–5.20)
RDW: 15.5 % — ABNORMAL HIGH (ref 11.5–14.5)
WBC: 7.5 10*3/uL (ref 3.6–11.0)

## 2017-11-12 LAB — URINALYSIS, COMPLETE (UACMP) WITH MICROSCOPIC
Bilirubin Urine: NEGATIVE
Glucose, UA: NEGATIVE mg/dL
KETONES UR: NEGATIVE mg/dL
LEUKOCYTES UA: NEGATIVE
NITRITE: NEGATIVE
PH: 5 (ref 5.0–8.0)
Protein, ur: NEGATIVE mg/dL
Specific Gravity, Urine: 1.004 — ABNORMAL LOW (ref 1.005–1.030)

## 2017-11-12 LAB — LIPASE, BLOOD: LIPASE: 33 U/L (ref 11–51)

## 2017-11-12 MED ORDER — DICYCLOMINE HCL 10 MG PO CAPS
10.0000 mg | ORAL_CAPSULE | Freq: Once | ORAL | Status: AC
  Administered 2017-11-12: 10 mg via ORAL
  Filled 2017-11-12: qty 1

## 2017-11-12 MED ORDER — ONDANSETRON HCL 4 MG PO TABS: 4.0000 mg | ORAL_TABLET | Freq: Three times a day (TID) | ORAL | 0 refills | Status: DC | PRN

## 2017-11-12 MED ORDER — SODIUM CHLORIDE 0.9 % IV BOLUS (SEPSIS)
1000.0000 mL | Freq: Once | INTRAVENOUS | Status: AC
  Administered 2017-11-12: 1000 mL via INTRAVENOUS

## 2017-11-12 MED ORDER — DICYCLOMINE HCL 20 MG PO TABS: 20.0000 mg | ORAL_TABLET | Freq: Three times a day (TID) | ORAL | 0 refills | Status: DC | PRN

## 2017-11-12 MED ORDER — ONDANSETRON HCL 4 MG/2ML IJ SOLN
4.0000 mg | Freq: Once | INTRAMUSCULAR | Status: AC
  Administered 2017-11-12: 4 mg via INTRAVENOUS
  Filled 2017-11-12: qty 2

## 2017-11-12 NOTE — ED Provider Notes (Signed)
[facility] Emergency Department Provider Note    ____________________________________________   I have reviewed the triage vital signs and the nursing notes.   HISTORY  Chief Complaint Abdominal Pain   History limited by: Not Limited   HPI Sylvia Richardson is a 62 y.o. female who presents to the emergency department today because of concern for diarrhea and abdominal cramping. The diarrhea has been present for the past ten days. Has been accompanied by abdominal cramping.  The cramping moves around the stomach.  It does wax and wane in intensity.  Patient has some associated nausea.  She denies any bloody or black or tarry diarrhea.  Tried taking some over-the-counter diarrhea medicine without any relief.  Denies any new medications or antibiotics.  No  fevers.   Per medical record review patient has a history of COPD, cholecystectomy.  Past Medical History:  Diagnosis Date  . Bipolar disorder ([place])   . Cancer ([place])    skin  . Carotid artery disease ([place])   . COPD (chronic obstructive pulmonary disease) ([place])   . H/O degenerative disc disease   . Hypercholesteremia   . Hypertension   . Stroke [facility])     Patient Active Problem List   Diagnosis Date Noted  . Bilateral carotid artery stenosis 02/14/2017  . Essential hypertension 02/14/2017    Past Surgical History:  Procedure Laterality Date  . BREAST BIOPSY Left    core bx- neg  . CESAREAN SECTION    . CHOLECYSTECTOMY    . TONSILLECTOMY    . TUBAL LIGATION      Prior to Admission medications   Medication Sig Start Date End Date Taking? Authorizing Provider  albuterol (PROVENTIL HFA;VENTOLIN HFA) 108 (90 Base) MCG/ACT inhaler Inhale into the lungs. 12/12/16 10/31/17  [provider]  aspirin EC 81 MG tablet Take by mouth. 10/02/10   [provider]  atenolol (TENORMIN) 100 MG tablet take 1/2 to 1 tablet by mouth once daily 01/17/17   [provider]  busPIRone  (BUSPAR) 15 MG tablet  01/06/17   [provider]  clonazePAM (KLONOPIN) 0.5 MG tablet Take 0.5 mg by mouth.    [provider]  CYMBALTA 60 MG capsule  02/11/17   [provider]  DALIRESP 500 MCG TABS tablet Take 1 tablet (500 mcg total) by mouth daily. 09/04/17   Allyne Gee, MD  esomeprazole (NEXIUM) 40 MG capsule Take by mouth. 10/31/16   [provider]  HYDROcodone-acetaminophen (NORCO) 5-325 MG tablet Take 1 tablet by mouth every 6 (six) hours as needed for moderate pain. Patient not taking: Reported on 02/14/2017 08/26/15   Daymon Larsen, MD  hydrocortisone (ANUSOL-HC) 25 MG suppository insert 1 suppository rectally twice a day 08/17/12   [provider]  ipratropium-albuterol (DUONEB) 0.5-2.5 (3) MG/3ML SOLN  09/21/13   [provider]  levofloxacin (LEVAQUIN) 750 MG tablet Take 1 tablet (750 mg total) by mouth daily. Patient not taking: Reported on 02/14/2017 08/26/15   Daymon Larsen, MD  metoCLOPramide (REGLAN) 10 MG tablet Take 10 mg by mouth.    [provider]  montelukast (SINGULAIR) 10 MG tablet Take 10 mg by mouth. 02/26/14   [provider]  ranitidine (ZANTAC) 300 MG tablet  12/27/16   [provider]  tiotropium (SPIRIVA HANDIHALER) 18 MCG inhalation capsule once daily.  09/10/13   [provider]  traZODone (DESYREL) 100 MG tablet  10/20/14   [provider]    Allergies  Ciprofloxacin and Penicillins  Family History  Problem Relation Age of Onset  . Breast cancer Neg Hx     Social History Social History   Tobacco Use  . Smoking status: Current Every Day Smoker  . Smokeless tobacco: Never Used  Substance Use Topics  . Alcohol use: No  . Drug use: No    Review of Systems Constitutional: No fever/chills Eyes: No visual changes. ENT: No sore throat. Cardiovascular: Denies chest pain. Respiratory: Denies shortness of breath. Gastrointestinal: Positive for abdominal  cramping and diarrhea. Genitourinary: Negative for dysuria. Musculoskeletal: Negative for back pain. Skin: Negative for rash. Neurological: Negative for headaches, focal weakness or numbness.  ____________________________________________   PHYSICAL EXAM:  VITAL SIGNS: ED Triage Vitals  Enc Vitals Group     BP 11/12/17 1821 (!) 132/59     Pulse Rate 11/12/17 1821 79     Resp 11/12/17 1821 16     Temp 11/12/17 1821 98.2 F (36.8 C)     Temp Source 11/12/17 1821 Oral     SpO2 11/12/17 1821 93 %     Weight 11/12/17 1822 134 lb (60.8 kg)     Height 11/12/17 1822 5\' 4"  (1.626 m)     Head Circumference --      Peak Flow --      Pain Score 11/12/17 1821 8   Constitutional: Alert and oriented. Well appearing and in no distress. Eyes: Conjunctivae are normal.  ENT   Head: Normocephalic and atraumatic.   Nose: No congestion/rhinnorhea.   Mouth/Throat: Mucous membranes are moist.   Neck: No stridor. Hematological/Lymphatic/Immunilogical: No cervical lymphadenopathy. Cardiovascular: Normal rate, regular rhythm.  No murmurs, rubs, or gallops.  Respiratory: Normal respiratory effort without tachypnea nor retractions. Breath sounds are clear and equal bilaterally. No wheezes/rales/rhonchi. Gastrointestinal: Soft and somewhat diffusely tender. No rebound. No guarding.  Genitourinary: Deferred Musculoskeletal: Normal range of motion in all extremities. No lower extremity edema. Neurologic:  Normal speech and language. No gross focal neurologic deficits are appreciated.  Skin:  Skin is warm, dry and intact. No rash noted. Psychiatric: Mood and affect are normal. Speech and behavior are normal. Patient exhibits appropriate insight and judgment.  ____________________________________________    LABS (pertinent positives/negatives)  Lipase 33 CBC wnl except RDW 15.5 CMP na 133, k 4.1, glu 120 UA moderate urine hgb, many bacteria, 0-5 RBC and  WBC  ____________________________________________   EKG  None  ____________________________________________    RADIOLOGY  None  ____________________________________________   PROCEDURES  Procedures  ____________________________________________   INITIAL IMPRESSION / ASSESSMENT AND PLAN / ED COURSE  Pertinent labs & imaging results that were available during my care of the patient were reviewed by me and considered in my medical decision making (see chart for details).  Patient presented to the emergency department today because of concerns for diarrhea and some abdominal cramping.  This has been going on for roughly 10 days.  On exam patient appears well.  Abdomen is benign.  Blood work without any concerning findings.  Patient did feel better after IV fluids and medications.  At this point given improvement and lack of concerning blood findings not feel any emergent abdominal imaging is necessary.  Will plan on discharging home with further medications.  Discussed findings workup and plan with patient.   ____________________________________________   FINAL CLINICAL IMPRESSION(S) / ED DIAGNOSES  Final diagnoses:  Abdominal pain, unspecified abdominal location  Diarrhea, unspecified type     Note: This dictation was prepared with Dragon dictation. Any transcriptional  errors that result from this process are unintentional     Nance Pear, MD 11/12/17 2314

## 2017-11-12 NOTE — ED Notes (Signed)
First RN note:  Patient comes in via EMS from home c/o diarrhea, congestion, and cold symptoms x 10 days.  Patient had minimal tenting present to the skin, so fluids were initiated with EMS.  All VSS at this time.

## 2017-11-12 NOTE — ED Triage Notes (Signed)
Pt comes into the ED via EMS from home with c/o cough, congestion, bodyaches, chills with generalized abd cramping with nausea and diarrhea for the past 10 days.. Denies vomiting.Marland Kitchen

## 2017-11-12 NOTE — Discharge Instructions (Signed)
Please seek medical attention for any high fevers, chest pain, shortness of breath, change in behavior, persistent vomiting, bloody stool or any other new or concerning symptoms.  

## 2018-02-17 ENCOUNTER — Ambulatory Visit (INDEPENDENT_AMBULATORY_CARE_PROVIDER_SITE_OTHER): Payer: Medicaid Other | Admitting: Vascular Surgery

## 2018-02-17 ENCOUNTER — Encounter (INDEPENDENT_AMBULATORY_CARE_PROVIDER_SITE_OTHER): Payer: Medicaid Other

## 2018-03-31 ENCOUNTER — Ambulatory Visit: Payer: Medicaid Other | Admitting: Internal Medicine

## 2018-03-31 ENCOUNTER — Encounter: Payer: Self-pay | Admitting: Internal Medicine

## 2018-03-31 VITALS — BP 138/72 | HR 71 | Resp 16 | Ht 64.0 in | Wt 137.0 lb

## 2018-03-31 DIAGNOSIS — J440 Chronic obstructive pulmonary disease with acute lower respiratory infection: Secondary | ICD-10-CM | POA: Diagnosis not present

## 2018-03-31 DIAGNOSIS — J209 Acute bronchitis, unspecified: Secondary | ICD-10-CM

## 2018-03-31 DIAGNOSIS — F17210 Nicotine dependence, cigarettes, uncomplicated: Secondary | ICD-10-CM

## 2018-03-31 DIAGNOSIS — R5383 Other fatigue: Secondary | ICD-10-CM

## 2018-03-31 DIAGNOSIS — F419 Anxiety disorder, unspecified: Secondary | ICD-10-CM | POA: Diagnosis not present

## 2018-03-31 MED ORDER — SULFAMETHOXAZOLE-TRIMETHOPRIM 800-160 MG PO TABS: 1.0000 | ORAL_TABLET | Freq: Two times a day (BID) | ORAL | 0 refills | Status: DC

## 2018-03-31 MED ORDER — PREDNISONE 10 MG PO TABS: 10.0000 mg | ORAL_TABLET | Freq: Every day | ORAL | 0 refills | Status: DC

## 2018-03-31 NOTE — Progress Notes (Signed)
[facility] [place], [place] [address]  Pulmonary Sleep Medicine   Office Visit Note  Patient Name: Sylvia Richardson DOB: September 20, 1955 MRN [id number]  Date of Service: 03/31/2018  Complaints/HPI:   Patient is here for evaluation shortness of breath.  She states that she has been having increasing cough increasing congestion.  She has been having green sputum.  She denies having any hemoptysis.  States that she was seen by primary care was given a prednisone dose pack.  She states he was not given any antibiotics.  Also she states that she did not have a chest x-ray done.  States she has no fevers no chills.  She does have wheezing.  Unfortunately she continues to smoke.  She says that she has been having no chest pain noted no palpitations noted.  ROS  General: (-) fever, (-) chills, (-) night sweats, (-) weakness Skin: (-) rashes, (-) itching,. Eyes: (-) visual changes, (-) redness, (-) itching. Nose and Sinuses: (-) nasal stuffiness or itchiness, (-) postnasal drip, (-) nosebleeds, (-) sinus trouble. Mouth and Throat: (-) sore throat, (-) hoarseness. Neck: (-) swollen glands, (-) enlarged thyroid, (-) neck pain. Respiratory: + cough, (-) bloody sputum, + shortness of breath, + wheezing. Cardiovascular: - ankle swelling, (-) chest pain. Lymphatic: (-) lymph node enlargement. Neurologic: (-) numbness, (-) tingling. Psychiatric: (-) anxiety, (-) depression   Current Medication: Outpatient Encounter Medications as of 03/31/2018  Medication Sig  . aspirin EC 81 MG tablet Take 81 mg by mouth daily.   Marland Kitchen atenolol (TENORMIN) 100 MG tablet take 1/2 to 1 tablet by mouth once daily  . busPIRone (BUSPAR) 15 MG tablet Take 15 mg by mouth daily.   . clonazePAM (KLONOPIN) 1 MG tablet Take 1 mg by mouth daily as needed for anxiety.   . CYMBALTA 60 MG capsule Take 120 mg by mouth daily.   Marland Kitchen DALIRESP 500 MCG TABS tablet Take 1 tablet (500 mcg total) by mouth daily.   Marland Kitchen dicyclomine (BENTYL) 20 MG tablet Take 1 tablet (20 mg total) by mouth 3 (three) times daily as needed (abdominal pain).  Marland Kitchen esomeprazole (NEXIUM) 40 MG capsule Take 40 mg by mouth daily.   Marland Kitchen HYDROcodone-acetaminophen (NORCO) 5-325 MG tablet Take 1 tablet by mouth every 6 (six) hours as needed for moderate pain.  Marland Kitchen ipratropium-albuterol (DUONEB) 0.5-2.5 (3) MG/3ML SOLN Take 3 mLs by nebulization every 6 (six) hours as needed (shortness of breath).   . metoCLOPramide (REGLAN) 10 MG tablet Take 10 mg by mouth every 6 (six) hours as needed for nausea or vomiting.   . montelukast (SINGULAIR) 10 MG tablet Take 10 mg by mouth at bedtime.   . ondansetron (ZOFRAN) 4 MG tablet Take 1 tablet (4 mg total) by mouth every 8 (eight) hours as needed for nausea or vomiting.  . ranitidine (ZANTAC) 300 MG tablet Take 150 mg by mouth daily.   . traZODone (DESYREL) 100 MG tablet Take 200 mg by mouth at bedtime as needed for sleep.   Marland Kitchen albuterol (PROVENTIL HFA;VENTOLIN HFA) 108 (90 Base) MCG/ACT inhaler Inhale into the lungs.  . [DISCONTINUED] levofloxacin (LEVAQUIN) 750 MG tablet Take 1 tablet (750 mg total) by mouth daily. (Patient not taking: Reported on 03/31/2018)   No facility-administered encounter medications on file as of 03/31/2018.     Surgical History: Past Surgical History:  Procedure Laterality Date  . BREAST BIOPSY Left    core bx- neg  . CESAREAN SECTION    . CHOLECYSTECTOMY    .  TONSILLECTOMY    . TUBAL LIGATION      Medical History: Past Medical History:  Diagnosis Date  . Bipolar disorder ([place])   . Cancer ([place])    skin  . Carotid artery disease ([place])   . COPD (chronic obstructive pulmonary disease) ([place])   . H/O degenerative disc disease   . Hypercholesteremia   . Hypertension   . Stroke [facility])     Family History: Family History  Problem Relation Age of Onset  . Prostate cancer Father   . Stroke Maternal Grandfather   . Stroke Paternal Grandfather   . Breast cancer Neg Hx      Social History: Social History   Socioeconomic History  . Marital status: Divorced    Spouse name: Not on file  . Number of children: Not on file  . Years of education: Not on file  . Highest education level: Not on file  Occupational History  . Not on file  Social Needs  . Financial resource strain: Not on file  . Food insecurity:    Worry: Not on file    Inability: Not on file  . Transportation needs:    Medical: Not on file    Non-medical: Not on file  Tobacco Use  . Smoking status: Current Every Day Smoker  . Smokeless tobacco: Never Used  Substance and Sexual Activity  . Alcohol use: No  . Drug use: No  . Sexual activity: Not on file  Lifestyle  . Physical activity:    Days per week: Not on file    Minutes per session: Not on file  . Stress: Not on file  Relationships  . Social connections:    Talks on phone: Not on file    Gets together: Not on file    Attends religious service: Not on file    Active member of club or organization: Not on file    Attends meetings of clubs or organizations: Not on file    Relationship status: Not on file  . Intimate partner violence:    Fear of current or ex partner: Not on file    Emotionally abused: Not on file    Physically abused: Not on file    Forced sexual activity: Not on file  Other Topics Concern  . Not on file  Social History Narrative  . Not on file    Vital Signs: Blood pressure 138/72, pulse 71, resp. rate 16, height 5\' 4"  (1.626 m), weight 137 lb (62.1 kg), SpO2 94 %.  Examination: General Appearance: The patient is well-developed, well-nourished, and in no distress. Skin: Gross inspection of skin unremarkable. Head: normocephalic, no gross deformities. Eyes: no gross deformities noted. ENT: ears appear grossly normal no exudates. Neck: Supple. No thyromegaly. No LAD. Respiratory: scattered rhonchi. Cardiovascular: Normal S1 and S2 without murmur or rub. Extremities: No cyanosis. pulses are  equal. Neurologic: Alert and oriented. No involuntary movements.  LABS: No results found for this or any previous visit (from the past 2160 hour(s)).  Radiology: No results found.  No results found.  No results found.    Assessment and Plan: Patient Active Problem List   Diagnosis Date Noted  . Bilateral carotid artery stenosis 02/14/2017  . Essential hypertension 02/14/2017    1. COPD exacerbation  Likely secondary to ongoing tobacco abuse.  Patient denied had a lengthy discussion about smoking cessation.  Four the current exacerbation I think she will need to be on antibiotics and steroids been an change in her sputum.  She has had increased amounts of sputum also has noted change in color her sputum 2. Smoker we had a very lengthy discussion about smoking cessation.  She has prescription 4 nicotine patch she has not started using the nicotine patch.  Have wonder advanced using the patch and smoking at this time. 3. Fatigue  Likely secondary to above she again encouraged to quit smoking 4. Anxiety has been followed by Psychiatry  General Counseling: I have discussed the findings of the evaluation and examination with Blanch Media.  I have also discussed any further diagnostic evaluation thatmay be needed or ordered today. [place] verbalizes understanding of the findings of todays visit. We also reviewed her medications today and discussed drug interactions and side effects including but not limited excessive drowsiness and altered mental states. We also discussed that there is always a risk not just to her but also people around her. she has been encouraged to call the office with any questions or concerns that should arise related to todays visit.  Smoking cessation counseling: 1. Pt acknowledges the risks of long term smoking, she will try to quite smoking. 2. Options for different medications including nicotine products, chewing gum, patch etc, Wellbutrin and Chantix is discussed 3. Goal  and date of compete cessation is discussed 4. Total time spent in smoking cessation is 10 min.   Time spent: 87min  I have personally obtained a history, examined the patient, evaluated laboratory and imaging results, formulated the assessment and plan and placed orders.    Allyne Gee, MD [facility]

## 2018-03-31 NOTE — Patient Instructions (Signed)
Coping with Quitting Smoking Quitting smoking is a physical and mental challenge. You will face cravings, withdrawal symptoms, and temptation. Before quitting, work with your health care provider to make a plan that can help you cope. Preparation can help you quit and keep you from giving in. How can I cope with cravings? Cravings usually last for 5-10 minutes. If you get through it, the craving will pass. Consider taking the following actions to help you cope with cravings:  Keep your mouth busy: ? Chew sugar-free gum. ? Suck on hard candies or a straw. ? Brush your teeth.  Keep your hands and body busy: ? Immediately change to a different activity when you feel a craving. ? Squeeze or play with a ball. ? Do an activity or a hobby, like making bead jewelry, practicing needlepoint, or working with wood. ? Mix up your normal routine. ? Take a short exercise break. Go for a quick walk or run up and down stairs. ? Spend time in public places where smoking is not allowed.  Focus on doing something kind or helpful for someone else.  Call a friend or family member to talk during a craving.  Join a support group.  Call a quit line, such as 1-800-QUIT-NOW.  Talk with your health care provider about medicines that might help you cope with cravings and make quitting easier for you.  How can I deal with withdrawal symptoms? Your body may experience negative effects as it tries to get used to not having nicotine in the system. These effects are called withdrawal symptoms. They may include:  Feeling hungrier than normal.  Trouble concentrating.  Irritability.  Trouble sleeping.  Feeling depressed.  Restlessness and agitation.  Craving a cigarette.  To manage withdrawal symptoms:  Avoid places, people, and activities that trigger your cravings.  Remember why you want to quit.  Get plenty of sleep.  Avoid coffee and other caffeinated drinks. These may worsen some of your  symptoms.  How can I handle social situations? Social situations can be difficult when you are quitting smoking, especially in the first few weeks. To manage this, you can:  Avoid parties, bars, and other social situations where people might be smoking.  Avoid alcohol.  Leave right away if you have the urge to smoke.  Explain to your family and friends that you are quitting smoking. Ask for understanding and support.  Plan activities with friends or family where smoking is not an option.  What are some ways I can cope with stress? Wanting to smoke may cause stress, and stress can make you want to smoke. Find ways to manage your stress. Relaxation techniques can help. For example:  Breathe slowly and deeply, in through your nose and out through your mouth.  Listen to soothing, relaxing music.  Talk with a family member or friend about your stress.  Light a candle.  Soak in a bath or take a shower.  Think about a peaceful place.  What are some ways I can prevent weight gain? Be aware that many people gain weight after they quit smoking. However, not everyone does. To keep from gaining weight, have a plan in place before you quit and stick to the plan after you quit. Your plan should include:  Having healthy snacks. When you have a craving, it may help to: ? Eat plain popcorn, crunchy carrots, celery, or other cut vegetables. ? Chew sugar-free gum.  Changing how you eat: ? Eat small portion sizes at meals. ?   Eat 4-6 small meals throughout the day instead of 1-2 large meals a day. ? Be mindful when you eat. Do not watch television or do other things that might distract you as you eat.  Exercising regularly: ? Make time to exercise each day. If you do not have time for a long workout, do short bouts of exercise for 5-10 minutes several times a day. ? Do some form of strengthening exercise, like weight lifting, and some form of aerobic exercise, like running or  swimming.  Drinking plenty of water or other low-calorie or no-calorie drinks. Drink 6-8 glasses of water daily, or as much as instructed by your health care provider.  Summary  Quitting smoking is a physical and mental challenge. You will face cravings, withdrawal symptoms, and temptation to smoke again. Preparation can help you as you go through these challenges.  You can cope with cravings by keeping your mouth busy (such as by chewing gum), keeping your body and hands busy, and making calls to family, friends, or a helpline for people who want to quit smoking.  You can cope with withdrawal symptoms by avoiding places where people smoke, avoiding drinks with caffeine, and getting plenty of rest.  Ask your health care provider about the different ways to prevent weight gain, avoid stress, and handle social situations. This information is not intended to replace advice given to you by your health care provider. Make sure you discuss any questions you have with your health care provider. Document Released: 08/30/2016 Document Revised: 08/30/2016 Document Reviewed: 08/30/2016 Elsevier Interactive Patient Education  2018 Elsevier Inc.  

## 2018-04-14 ENCOUNTER — Other Ambulatory Visit: Payer: Self-pay | Admitting: Internal Medicine

## 2018-04-22 ENCOUNTER — Other Ambulatory Visit (INDEPENDENT_AMBULATORY_CARE_PROVIDER_SITE_OTHER): Payer: Self-pay | Admitting: Vascular Surgery

## 2018-04-22 ENCOUNTER — Ambulatory Visit (INDEPENDENT_AMBULATORY_CARE_PROVIDER_SITE_OTHER): Payer: Medicaid Other | Admitting: Vascular Surgery

## 2018-04-22 ENCOUNTER — Ambulatory Visit (INDEPENDENT_AMBULATORY_CARE_PROVIDER_SITE_OTHER): Payer: Medicaid Other

## 2018-04-22 ENCOUNTER — Encounter (INDEPENDENT_AMBULATORY_CARE_PROVIDER_SITE_OTHER): Payer: Self-pay | Admitting: Vascular Surgery

## 2018-04-22 VITALS — BP 127/75 | HR 72 | Resp 13 | Ht 64.0 in | Wt 137.0 lb

## 2018-04-22 DIAGNOSIS — I6523 Occlusion and stenosis of bilateral carotid arteries: Secondary | ICD-10-CM | POA: Diagnosis not present

## 2018-04-22 DIAGNOSIS — I739 Peripheral vascular disease, unspecified: Secondary | ICD-10-CM

## 2018-04-22 NOTE — Progress Notes (Signed)
Subjective:    Patient ID: Sylvia Richardson, female    DOB: 1955-10-11, 62 y.o.   MRN: [id number] Chief Complaint  Patient presents with  . Follow-up    1 year CAROTID   Patient presents for a yearly non-invasive study follow up for carotid stenosis. The stenosis has been followed by surveillance duplexes. The patient underwent a bilateral carotid duplex scan which showed no change from the previous exam on 02-14-17. Duplex is stable at 40-59% ICA stenosis bilaterally.  Vertebral arteries are antegrade bilaterally.  Subclavian arteries are within normal limits.  The patient denies experiencing Amaurosis Fugax, TIA like symptoms or focal motor deficits.  The patient denies any claudication-like symptoms, rest pain or ulcer formation to the bilateral lower extremity.  The patient denies any fever, nausea vomiting.  Review of Systems  Constitutional: Negative.   HENT: Negative.   Eyes: Negative.   Respiratory: Negative.   Cardiovascular:       Carotid artery stenosis Peripheral vascular disease  Gastrointestinal: Negative.   Endocrine: Negative.   Genitourinary: Negative.   Musculoskeletal: Negative.   Skin: Negative.   Allergic/Immunologic: Negative.   Neurological: Negative.   Hematological: Negative.   Psychiatric/Behavioral: Negative.       Objective:   Physical Exam  Constitutional: She is oriented to person, place, and time. She appears well-developed and well-nourished.  HENT:  Head: Normocephalic and atraumatic.  Right Ear: External ear normal.  Left Ear: External ear normal.  Eyes: Pupils are equal, round, and reactive to light. Conjunctivae and EOM are normal.  Neck: Normal range of motion.  No carotid bruits noted on exam  Cardiovascular: Normal rate, regular rhythm, normal heart sounds and intact distal pulses.  Pulses:      Radial pulses are 2+ on the right side, and 2+ on the left side.       Dorsalis pedis pulses are 1+ on the right side, and 1+ on the  left side.       Posterior tibial pulses are 1+ on the right side, and 1+ on the left side.  Pulmonary/Chest: Effort normal and breath sounds normal.  Musculoskeletal: Normal range of motion. She exhibits no edema.  Neurological: She is alert and oriented to person, place, and time.  Skin: Skin is warm and dry.  Psychiatric: She has a normal mood and affect. Her behavior is normal. Judgment and thought content normal.  Vitals reviewed.  BP 127/75 (BP Location: Right Arm, Patient Position: Sitting)   Pulse 72   Resp 13   Ht 5\' 4"  (1.626 m)   Wt 137 lb (62.1 kg)   BMI 23.52 kg/m   Past Medical History:  Diagnosis Date  . Bipolar disorder ([place])   . Cancer ([place])    skin  . Carotid artery disease ([place])   . COPD (chronic obstructive pulmonary disease) ([place])   . H/O degenerative disc disease   . Hypercholesteremia   . Hypertension   . Stroke [facility])    Social History   Socioeconomic History  . Marital status: Divorced    Spouse name: Not on file  . Number of children: Not on file  . Years of education: Not on file  . Highest education level: Not on file  Occupational History  . Not on file  Social Needs  . Financial resource strain: Not on file  . Food insecurity:    Worry: Not on file    Inability: Not on file  . Transportation needs:    Medical: Not  on file    Non-medical: Not on file  Tobacco Use  . Smoking status: Current Every Day Smoker  . Smokeless tobacco: Never Used  Substance and Sexual Activity  . Alcohol use: No  . Drug use: No  . Sexual activity: Not on file  Lifestyle  . Physical activity:    Days per week: Not on file    Minutes per session: Not on file  . Stress: Not on file  Relationships  . Social connections:    Talks on phone: Not on file    Gets together: Not on file    Attends religious service: Not on file    Active member of club or organization: Not on file    Attends meetings of clubs or organizations: Not on file    Relationship  status: Not on file  . Intimate partner violence:    Fear of current or ex partner: Not on file    Emotionally abused: Not on file    Physically abused: Not on file    Forced sexual activity: Not on file  Other Topics Concern  . Not on file  Social History Narrative  . Not on file   Past Surgical History:  Procedure Laterality Date  . BREAST BIOPSY Left    core bx- neg  . CESAREAN SECTION    . CHOLECYSTECTOMY    . TONSILLECTOMY    . TUBAL LIGATION     Family History  Problem Relation Age of Onset  . Prostate cancer Father   . Stroke Maternal Grandfather   . Stroke Paternal Grandfather   . Breast cancer Neg Hx    Allergies  Allergen Reactions  . Ciprofloxacin Anaphylaxis  . Choline Fenofibrate     Other reaction(s): Other (See Comments) Other Reaction: LEFT SIDE TONGUE SWELLING  . Niacin Diarrhea  . Nifedipine   . Rosuvastatin   . Doxycycline Nausea Only  . Fish Oil Nausea Only  . Levaquin [Levofloxacin] Rash  . Penicillins Rash      Assessment & Plan:  Patient presents for a yearly non-invasive study follow up for carotid stenosis. The stenosis has been followed by surveillance duplexes. The patient underwent a bilateral carotid duplex scan which showed no change from the previous exam on 02-14-17. Duplex is stable at 40-59% ICA stenosis bilaterally.  Vertebral arteries are antegrade bilaterally.  Subclavian arteries are within normal limits.  The patient denies experiencing Amaurosis Fugax, TIA like symptoms or focal motor deficits.  The patient denies any claudication-like symptoms, rest pain or ulcer formation to the bilateral lower extremity.  The patient denies any fever, nausea vomiting.  1. PAD (peripheral artery disease) ([place]) - Stable She presents asymptomatically -has any claudication-like symptoms, rest pain or ulcer formation to the bilateral lower extremity Patient to follow-up in 1 year with the ABI to continue to surveilled her PAD I have discussed with  the patient at length the risk factors for and pathogenesis of atherosclerotic disease and encouraged a healthy diet, regular exercise regimen and blood pressure / glucose control.  The patient was encouraged to call the office in the interim if he experiences any claudication like symptoms, rest pain or ulcers to his feet / toes.  - VAS [place] ABI WITH/WO TBI; Future  2. Bilateral carotid artery stenosis - Stable No change in bilateral by ICA stenosis Patient presents asymptomatically Patient to return in one year for surveillance carotid duplex. I have discussed with the patient at length the risk factors for and pathogenesis of  atherosclerotic disease and encouraged a healthy diet, regular exercise regimen and blood pressure / glucose control.  Patient was instructed to contact our office in the interim with problems such as arm / leg weakness or numbness, speech / swallowing difficulty or temporary monocular blindness. The patient expresses their understanding.   - VAS [place] CAROTID; Future  Current Outpatient Medications on File Prior to Visit  Medication Sig Dispense Refill  . aspirin EC 81 MG tablet Take 81 mg by mouth daily.     Marland Kitchen atenolol (TENORMIN) 100 MG tablet take 1/2 to 1 tablet by mouth once daily  0  . busPIRone (BUSPAR) 15 MG tablet Take 15 mg by mouth daily.   0  . clonazePAM (KLONOPIN) 1 MG tablet Take 1 mg by mouth daily as needed for anxiety.     . CYMBALTA 60 MG capsule Take 120 mg by mouth daily.   0  . DALIRESP 500 MCG TABS tablet TAKE 1 TABLET BY MOUTH ONCE DAILY. 30 tablet 0  . dicyclomine (BENTYL) 20 MG tablet Take 1 tablet (20 mg total) by mouth 3 (three) times daily as needed (abdominal pain). 30 tablet 0  . esomeprazole (NEXIUM) 40 MG capsule Take 40 mg by mouth daily.     . fluticasone (FLONASE) 50 MCG/ACT nasal spray Place into the nose.    Marland Kitchen HYDROcodone-acetaminophen (NORCO) 5-325 MG tablet Take 1 tablet by mouth every 6 (six) hours as needed for moderate pain. 20  tablet 0  . ipratropium-albuterol (DUONEB) 0.5-2.5 (3) MG/3ML SOLN Take 3 mLs by nebulization every 6 (six) hours as needed (shortness of breath).     . metoCLOPramide (REGLAN) 10 MG tablet Take 10 mg by mouth every 6 (six) hours as needed for nausea or vomiting.     . montelukast (SINGULAIR) 10 MG tablet Take 10 mg by mouth at bedtime.     . nicotine (NICODERM CQ - DOSED IN MG/24 HOURS) 14 mg/24hr patch Place onto the skin.    Marland Kitchen ondansetron (ZOFRAN) 4 MG tablet Take 1 tablet (4 mg total) by mouth every 8 (eight) hours as needed for nausea or vomiting. 20 tablet 0  . prednisoLONE acetate (PRED FORTE) 1 % ophthalmic suspension SHAKE LQ AND INT 1 GTT IN OS TID  0  . ranitidine (ZANTAC) 300 MG tablet Take 150 mg by mouth daily.   0  . traZODone (DESYREL) 100 MG tablet Take 200 mg by mouth at bedtime as needed for sleep.     Marland Kitchen albuterol (PROVENTIL HFA;VENTOLIN HFA) 108 (90 Base) MCG/ACT inhaler Inhale into the lungs.    . predniSONE (DELTASONE) 10 MG tablet Take 1 tablet (10 mg total) by mouth daily with breakfast. (Patient not taking: Reported on 04/22/2018) 21 tablet 0  . sulfamethoxazole-trimethoprim (BACTRIM DS,SEPTRA DS) 800-160 MG tablet Take 1 tablet by mouth 2 (two) times daily. (Patient not taking: Reported on 04/22/2018) 20 tablet 0   No current facility-administered medications on file prior to visit.    There are no Patient Instructions on file for this visit. No follow-ups on file.  Qualyn Oyervides A Linnell Swords, PA-C

## 2018-05-11 ENCOUNTER — Other Ambulatory Visit: Payer: Self-pay | Admitting: Internal Medicine

## 2018-06-08 ENCOUNTER — Other Ambulatory Visit: Payer: Self-pay | Admitting: Internal Medicine

## 2018-06-25 ENCOUNTER — Other Ambulatory Visit: Payer: Self-pay | Admitting: Pediatrics

## 2018-06-25 DIAGNOSIS — J439 Emphysema, unspecified: Secondary | ICD-10-CM

## 2018-06-25 DIAGNOSIS — E559 Vitamin D deficiency, unspecified: Secondary | ICD-10-CM

## 2018-06-25 DIAGNOSIS — Z1231 Encounter for screening mammogram for malignant neoplasm of breast: Secondary | ICD-10-CM

## 2018-06-25 DIAGNOSIS — Z8262 Family history of osteoporosis: Secondary | ICD-10-CM

## 2018-07-05 ENCOUNTER — Other Ambulatory Visit: Payer: Self-pay | Admitting: Internal Medicine

## 2018-07-07 ENCOUNTER — Ambulatory Visit: Payer: Self-pay | Admitting: Internal Medicine

## 2018-07-09 ENCOUNTER — Ambulatory Visit: Payer: Medicaid Other | Admitting: Adult Health

## 2018-07-09 ENCOUNTER — Encounter: Payer: Self-pay | Admitting: Adult Health

## 2018-07-09 ENCOUNTER — Ambulatory Visit: Payer: Self-pay | Admitting: Internal Medicine

## 2018-07-09 VITALS — BP 138/54 | HR 77 | Temp 98.1°F | Resp 16 | Ht 64.0 in | Wt 138.0 lb

## 2018-07-09 DIAGNOSIS — R059 Cough, unspecified: Secondary | ICD-10-CM

## 2018-07-09 DIAGNOSIS — J0100 Acute maxillary sinusitis, unspecified: Secondary | ICD-10-CM | POA: Diagnosis not present

## 2018-07-09 DIAGNOSIS — R6889 Other general symptoms and signs: Secondary | ICD-10-CM | POA: Diagnosis not present

## 2018-07-09 DIAGNOSIS — R0982 Postnasal drip: Secondary | ICD-10-CM

## 2018-07-09 DIAGNOSIS — R05 Cough: Secondary | ICD-10-CM

## 2018-07-09 DIAGNOSIS — F17219 Nicotine dependence, cigarettes, with unspecified nicotine-induced disorders: Secondary | ICD-10-CM

## 2018-07-09 LAB — POCT INFLUENZA A/B
INFLUENZA A, POC: NEGATIVE
Influenza B, POC: NEGATIVE

## 2018-07-09 MED ORDER — METHYLPREDNISOLONE ACETATE 80 MG/ML IJ SUSP
80.0000 mg | Freq: Once | INTRAMUSCULAR | Status: AC
  Administered 2018-07-09: 40 mg via INTRAMUSCULAR

## 2018-07-09 MED ORDER — CLINDAMYCIN HCL 300 MG PO CAPS: 300.0000 mg | ORAL_CAPSULE | Freq: Three times a day (TID) | ORAL | 0 refills | Status: DC

## 2018-07-09 NOTE — Progress Notes (Signed)
[facility] [place], [place] [address]  Pulmonary Sleep Medicine   Office Visit Note  Patient Name: Sylvia Richardson DOB: 10-13-1955 MRN [id number]  Date of Service: 07/09/2018  Complaints/HPI: Pt reports coughing, headache, sore throat, fever, and chills. Pt reports these symptoms have been on and off for 3 months.  She was given Bactrim, however she had a reaction to it and stopped taking it.  She reports she has never gotten better.  She got a flu shot oct 10th and has felt very bad since then.  He symptoms have increased since then. She has been taking Motrin for headache, as well as, Alka-seltzer plus.    ROS  General: (-) fever, (-) chills, (-) night sweats, (-) weakness Skin: (-) rashes, (-) itching,. Eyes: (-) visual changes, (-) redness, (-) itching. Nose and Sinuses: (-) nasal stuffiness or itchiness, (-) postnasal drip, (-) nosebleeds, (-) sinus trouble. Mouth and Throat: (-) sore throat, (-) hoarseness. Neck: (-) swollen glands, (-) enlarged thyroid, (-) neck pain. Respiratory: + cough, (-) bloody sputum, - shortness of breath, - wheezing. Cardiovascular: - ankle swelling, (-) chest pain. Lymphatic: (-) lymph node enlargement. Neurologic: (-) numbness, (-) tingling. Psychiatric: (-) anxiety, (-) depression   Current Medication: Outpatient Encounter Medications as of 07/09/2018  Medication Sig  . aspirin EC 81 MG tablet Take 81 mg by mouth daily.   Marland Kitchen atenolol (TENORMIN) 100 MG tablet take 1/2 to 1 tablet by mouth once daily  . busPIRone (BUSPAR) 15 MG tablet Take 15 mg by mouth daily.   . clonazePAM (KLONOPIN) 1 MG tablet Take 1 mg by mouth daily as needed for anxiety.   . CYMBALTA 60 MG capsule Take 120 mg by mouth daily.   Marland Kitchen DALIRESP 500 MCG TABS tablet TAKE 1 TABLET BY MOUTH ONCE DAILY.  Marland Kitchen dicyclomine (BENTYL) 20 MG tablet Take 1 tablet (20 mg total) by mouth 3 (three) times daily as needed (abdominal pain).  Marland Kitchen esomeprazole  (NEXIUM) 40 MG capsule Take 40 mg by mouth daily.   . fluticasone (FLONASE) 50 MCG/ACT nasal spray Place into the nose.  Marland Kitchen HYDROcodone-acetaminophen (NORCO) 5-325 MG tablet Take 1 tablet by mouth every 6 (six) hours as needed for moderate pain.  Marland Kitchen ipratropium-albuterol (DUONEB) 0.5-2.5 (3) MG/3ML SOLN Take 3 mLs by nebulization every 6 (six) hours as needed (shortness of breath).   . metoCLOPramide (REGLAN) 10 MG tablet Take 10 mg by mouth every 6 (six) hours as needed for nausea or vomiting.   . montelukast (SINGULAIR) 10 MG tablet Take 10 mg by mouth at bedtime.   . ondansetron (ZOFRAN) 4 MG tablet Take 1 tablet (4 mg total) by mouth every 8 (eight) hours as needed for nausea or vomiting.  . prednisoLONE acetate (PRED FORTE) 1 % ophthalmic suspension SHAKE LQ AND INT 1 GTT IN OS TID  . ranitidine (ZANTAC) 300 MG tablet Take 150 mg by mouth daily.   . traZODone (DESYREL) 100 MG tablet Take 200 mg by mouth at bedtime as needed for sleep.   Marland Kitchen albuterol (PROVENTIL HFA;VENTOLIN HFA) 108 (90 Base) MCG/ACT inhaler Inhale into the lungs.  . clindamycin (CLEOCIN) 300 MG capsule Take 1 capsule (300 mg total) by mouth 3 (three) times daily.  . [DISCONTINUED] predniSONE (DELTASONE) 10 MG tablet Take 1 tablet (10 mg total) by mouth daily with breakfast. (Patient not taking: Reported on 04/22/2018)  . [DISCONTINUED] sulfamethoxazole-trimethoprim (BACTRIM DS,SEPTRA DS) 800-160 MG tablet Take 1 tablet by mouth 2 (two) times daily. (  Patient not taking: Reported on 04/22/2018)  . [EXPIRED] methylPREDNISolone acetate (DEPO-MEDROL) injection 80 mg    No facility-administered encounter medications on file as of 07/09/2018.     Surgical History: Past Surgical History:  Procedure Laterality Date  . BREAST BIOPSY Left    core bx- neg  . CESAREAN SECTION    . CHOLECYSTECTOMY    . TONSILLECTOMY    . TUBAL LIGATION      Medical History: Past Medical History:  Diagnosis Date  . Bipolar disorder ([place])   . Cancer  ([place])    skin  . Carotid artery disease ([place])   . COPD (chronic obstructive pulmonary disease) ([place])   . H/O degenerative disc disease   . Hypercholesteremia   . Hypertension   . Stroke [facility])     Family History: Family History  Problem Relation Age of Onset  . Prostate cancer Father   . Stroke Maternal Grandfather   . Stroke Paternal Grandfather   . Breast cancer Neg Hx     Social History: Social History   Socioeconomic History  . Marital status: Divorced    Spouse name: Not on file  . Number of children: Not on file  . Years of education: Not on file  . Highest education level: Not on file  Occupational History  . Not on file  Social Needs  . Financial resource strain: Not on file  . Food insecurity:    Worry: Not on file    Inability: Not on file  . Transportation needs:    Medical: Not on file    Non-medical: Not on file  Tobacco Use  . Smoking status: Current Every Day Smoker  . Smokeless tobacco: Never Used  Substance and Sexual Activity  . Alcohol use: No  . Drug use: No  . Sexual activity: Not on file  Lifestyle  . Physical activity:    Days per week: Not on file    Minutes per session: Not on file  . Stress: Not on file  Relationships  . Social connections:    Talks on phone: Not on file    Gets together: Not on file    Attends religious service: Not on file    Active member of club or organization: Not on file    Attends meetings of clubs or organizations: Not on file    Relationship status: Not on file  . Intimate partner violence:    Fear of current or ex partner: Not on file    Emotionally abused: Not on file    Physically abused: Not on file    Forced sexual activity: Not on file  Other Topics Concern  . Not on file  Social History Narrative  . Not on file    Vital Signs: Blood pressure (!) 138/54, pulse 77, temperature 98.1 F (36.7 C), resp. rate 16, height 5\' 4"  (1.626 m), weight 138 lb (62.6 kg), SpO2 91  %.  Examination: General Appearance: The patient is well-developed, well-nourished, and in no distress. Skin: Gross inspection of skin unremarkable. Head: normocephalic, no gross deformities. Eyes: no gross deformities noted. ENT: ears appear grossly normal no exudates. Neck: Supple. No thyromegaly. No LAD. Respiratory: clear to auscultation bilateraly. Cardiovascular: Normal S1 and S2 without murmur or rub. Extremities: No cyanosis. pulses are equal. Neurologic: Alert and oriented. No involuntary movements.  LABS: Recent Results (from the past 2160 hour(s))  POCT Influenza A/B     Status: None   Collection Time: 07/09/18  3:08 PM  Result  Value Ref Range   Influenza A, POC Negative Negative   Influenza B, POC Negative Negative    Radiology: No results found.  No results found.  No results found.    Assessment and Plan: Patient Active Problem List   Diagnosis Date Noted  . PAD (peripheral artery disease) ([place]) 04/22/2018  . Bilateral carotid artery stenosis 02/14/2017  . Essential hypertension 02/14/2017   1. Acute non-recurrent maxillary sinusitis Pt has multiple antibiotic allergies.  Will give 7 day course of Cleocin. Instructed patient to return to clinic if symptoms persist beyond a week.  - clindamycin (CLEOCIN) 300 MG capsule; Take 1 capsule (300 mg total) by mouth 3 (three) times daily.  Dispense: 21 capsule; Refill: 0  2. Post-nasal discharge Encouraged OTC medication use to alleviate symptoms.  Also given 40mg  Depo-Medrol IM.   3. Flu-like symptoms Flu test negative  - POCT Influenza A/B  4. Cigarette nicotine dependence with nicotine-induced disorder Smoking cessation counseling: 1. Pt acknowledges the risks of long term smoking, she will try to quite smoking. 2. Options for different medications including nicotine products, chewing gum, patch etc, Wellbutrin and Chantix is discussed 3. Goal and date of compete cessation is discussed 4. Total time  spent in smoking cessation is 15 min.   General Counseling: I have discussed the findings of the evaluation and examination with Blanch Media.  I have also discussed any further diagnostic evaluation thatmay be needed or ordered today. Corean verbalizes understanding of the findings of todays visit. We also reviewed her medications today and discussed drug interactions and side effects including but not limited excessive drowsiness and altered mental states. We also discussed that there is always a risk not just to her but also people around her. she has been encouraged to call the office with any questions or concerns that should arise related to todays visit.    Time spent: .25 This patient was seen by Orson Gear AGNP-C in Collaboration with Dr. Devona Konig as a part of collaborative care agreement.   I have personally obtained a history, examined the patient, evaluated laboratory and imaging results, formulated the assessment and plan and placed orders.    Allyne Gee, MD [facility] Pulmonary and Critical Care Sleep medicine

## 2018-07-09 NOTE — Patient Instructions (Signed)

## 2018-07-14 ENCOUNTER — Ambulatory Visit: Payer: Self-pay | Admitting: Internal Medicine

## 2018-07-28 ENCOUNTER — Ambulatory Visit: Payer: Self-pay | Admitting: Internal Medicine

## 2018-08-07 ENCOUNTER — Telehealth: Payer: Self-pay | Admitting: Internal Medicine

## 2018-08-07 NOTE — Telephone Encounter (Signed)
Prior auth for Daliresp 510mcg has been approved through [employer] notified

## 2018-08-25 ENCOUNTER — Ambulatory Visit: Payer: Self-pay | Admitting: Internal Medicine

## 2018-10-07 ENCOUNTER — Other Ambulatory Visit: Payer: Medicaid Other

## 2018-11-05 ENCOUNTER — Other Ambulatory Visit: Payer: Self-pay | Admitting: Adult Health

## 2018-11-05 MED ORDER — ALBUTEROL SULFATE HFA 108 (90 BASE) MCG/ACT IN AERS: 2.0000 | INHALATION_SPRAY | RESPIRATORY_TRACT | 5 refills | Status: DC | PRN

## 2018-11-24 ENCOUNTER — Encounter: Payer: Self-pay | Admitting: Adult Health

## 2018-11-24 ENCOUNTER — Ambulatory Visit: Payer: Medicaid Other | Admitting: Adult Health

## 2018-11-24 VITALS — BP 149/89 | HR 84 | Temp 98.1°F | Resp 16 | Ht 64.0 in | Wt 143.0 lb

## 2018-11-24 DIAGNOSIS — J069 Acute upper respiratory infection, unspecified: Secondary | ICD-10-CM | POA: Diagnosis not present

## 2018-11-24 DIAGNOSIS — Z9981 Dependence on supplemental oxygen: Secondary | ICD-10-CM

## 2018-11-24 DIAGNOSIS — F17219 Nicotine dependence, cigarettes, with unspecified nicotine-induced disorders: Secondary | ICD-10-CM | POA: Diagnosis not present

## 2018-11-24 DIAGNOSIS — J449 Chronic obstructive pulmonary disease, unspecified: Secondary | ICD-10-CM

## 2018-11-24 DIAGNOSIS — I10 Essential (primary) hypertension: Secondary | ICD-10-CM | POA: Diagnosis not present

## 2018-11-24 MED ORDER — AZITHROMYCIN 250 MG PO TABS: ORAL_TABLET | ORAL | 0 refills | Status: DC

## 2018-11-24 MED ORDER — IPRATROPIUM-ALBUTEROL 0.5-2.5 (3) MG/3ML IN SOLN: 3.0000 mL | Freq: Four times a day (QID) | RESPIRATORY_TRACT | 0 refills | Status: DC | PRN

## 2018-11-24 MED ORDER — METHYLPREDNISOLONE ACETATE 80 MG/ML IJ SUSP
80.0000 mg | Freq: Once | INTRAMUSCULAR | Status: AC
  Administered 2018-11-24: 40 mg via INTRAMUSCULAR

## 2018-11-24 NOTE — Patient Instructions (Signed)
Chronic Obstructive Pulmonary Disease Chronic obstructive pulmonary disease (COPD) is a long-term (chronic) lung problem. When you have COPD, it is hard for air to get in and out of your lungs. Usually the condition gets worse over time, and your lungs will never return to normal. There are things you can do to keep yourself as healthy as possible.  Your doctor may treat your condition with: ? Medicines. ? Oxygen. ? Lung surgery.  Your doctor may also recommend: ? Rehabilitation. This includes steps to make your body work better. It may involve a team of specialists. ? Quitting smoking, if you smoke. ? Exercise and changes to your diet. ? Comfort measures (palliative care). Follow these instructions at home: Medicines  Take over-the-counter and prescription medicines only as told by your doctor.  Talk to your doctor before taking any cough or allergy medicines. You may need to avoid medicines that cause your lungs to be dry. Lifestyle  If you smoke, stop. Smoking makes the problem worse. If you need help quitting, ask your doctor.  Avoid being around things that make your breathing worse. This may include smoke, chemicals, and fumes.  Stay active, but remember to rest as well.  Learn and use tips on how to relax.  Make sure you get enough sleep. Most adults need at least 7 hours of sleep every night.  Eat healthy foods. Eat smaller meals more often. Rest before meals. Controlled breathing Learn and use tips on how to control your breathing as told by your doctor. Try:  Breathing in (inhaling) through your nose for 1 second. Then, pucker your lips and breath out (exhale) through your lips for 2 seconds.  Putting one hand on your belly (abdomen). Breathe in slowly through your nose for 1 second. Your hand on your belly should move out. Pucker your lips and breathe out slowly through your lips. Your hand on your belly should move in as you breathe out.  Controlled coughing Learn  and use controlled coughing to clear mucus from your lungs. Follow these steps: 1. Lean your head a little forward. 2. Breathe in deeply. 3. Try to hold your breath for 3 seconds. 4. Keep your mouth slightly open while coughing 2 times. 5. Spit any mucus out into a tissue. 6. Rest and do the steps again 1 or 2 times as needed. General instructions  Make sure you get all the shots (vaccines) that your doctor recommends. Ask your doctor about a flu shot and a pneumonia shot.  Use oxygen therapy and pulmonary rehabilitation if told by your doctor. If you need home oxygen therapy, ask your doctor if you should buy a tool to measure your oxygen level (oximeter).  Make a COPD action plan with your doctor. This helps you to know what to do if you feel worse than usual.  Manage any other conditions you have as told by your doctor.  Avoid going outside when it is very hot, cold, or humid.  Avoid people who have a sickness you can catch (contagious).  Keep all follow-up visits as told by your doctor. This is important. Contact a doctor if:  You cough up more mucus than usual.  There is a change in the color or thickness of the mucus.  It is harder to breathe than usual.  Your breathing is faster than usual.  You have trouble sleeping.  You need to use your medicines more often than usual.  You have trouble doing your normal activities such as getting dressed   or walking around the house. Get help right away if:  You have shortness of breath while resting.  You have shortness of breath that stops you from: ? Being able to talk. ? Doing normal activities.  Your chest hurts for longer than 5 minutes.  Your skin color is more blue than usual.  Your pulse oximeter shows that you have low oxygen for longer than 5 minutes.  You have a fever.  You feel too tired to breathe normally. Summary  Chronic obstructive pulmonary disease (COPD) is a long-term lung problem.  The way your  lungs work will never return to normal. Usually the condition gets worse over time. There are things you can do to keep yourself as healthy as possible.  Take over-the-counter and prescription medicines only as told by your doctor.  If you smoke, stop. Smoking makes the problem worse. This information is not intended to replace advice given to you by your health care provider. Make sure you discuss any questions you have with your health care provider. Document Released: 02/19/2008 Document Revised: 10/07/2016 Document Reviewed: 10/07/2016 Elsevier Interactive Patient Education  2019 Elsevier Inc.  

## 2018-11-24 NOTE — Progress Notes (Signed)
[facility] [place], [place] [address]  Pulmonary Sleep Medicine   Office Visit Note  Patient Name: Sylvia Richardson DOB: 12-Nov-1955 MRN [id number]  Date of Service: 11/24/2018  Complaints/HPI: Pt is here reporting 1 week of non-productive cough.  She reports she is now having back pain from her coughing.  Also having head congestion, headache, and a mild sore throat that is intermittent.  She reports some chills at home. No fever in office today.     ROS  General: (-) fever, (+) chills, (-) night sweats, (-) weakness Skin: (-) rashes, (-) itching,. Eyes: (-) visual changes, (-) redness, (-) itching. Nose and Sinuses: (-) nasal stuffiness or itchiness, (-) postnasal drip, (-) nosebleeds, (-) sinus trouble. Mouth and Throat: (-) sore throat, (-) hoarseness. Neck: (-) swollen glands, (-) enlarged thyroid, (-) neck pain. Respiratory: + cough, (-) bloody sputum, + shortness of breath, + wheezing. Cardiovascular: - ankle swelling, (-) chest pain. Lymphatic: (-) lymph node enlargement. Neurologic: (-) numbness, (-) tingling. Psychiatric: (-) anxiety, (-) depression   Current Medication: Outpatient Encounter Medications as of 11/24/2018  Medication Sig  . albuterol (PROVENTIL HFA;VENTOLIN HFA) 108 (90 Base) MCG/ACT inhaler Inhale 2 puffs into the lungs every 4 (four) hours as needed for wheezing or shortness of breath.  Marland Kitchen aspirin EC 81 MG tablet Take 81 mg by mouth daily.   Marland Kitchen atenolol (TENORMIN) 100 MG tablet take 1/2 to 1 tablet by mouth once daily  . busPIRone (BUSPAR) 15 MG tablet Take 15 mg by mouth daily.   . clonazePAM (KLONOPIN) 1 MG tablet Take 1 mg by mouth daily as needed for anxiety.   . CYMBALTA 60 MG capsule Take 120 mg by mouth daily.   Marland Kitchen DALIRESP 500 MCG TABS tablet TAKE 1 TABLET BY MOUTH ONCE DAILY.  Marland Kitchen dicyclomine (BENTYL) 20 MG tablet Take 1 tablet (20 mg total) by mouth 3 (three) times daily as needed (abdominal pain).  Marland Kitchen  esomeprazole (NEXIUM) 40 MG capsule Take 40 mg by mouth daily.   . fluticasone (FLONASE) 50 MCG/ACT nasal spray Place into the nose.  Marland Kitchen HYDROcodone-acetaminophen (NORCO) 5-325 MG tablet Take 1 tablet by mouth every 6 (six) hours as needed for moderate pain.  Marland Kitchen ipratropium-albuterol (DUONEB) 0.5-2.5 (3) MG/3ML SOLN Take 3 mLs by nebulization every 6 (six) hours as needed (shortness of breath).   . metoCLOPramide (REGLAN) 10 MG tablet Take 10 mg by mouth every 6 (six) hours as needed for nausea or vomiting.   . montelukast (SINGULAIR) 10 MG tablet Take 10 mg by mouth at bedtime.   . ondansetron (ZOFRAN) 4 MG tablet Take 1 tablet (4 mg total) by mouth every 8 (eight) hours as needed for nausea or vomiting.  . prednisoLONE acetate (PRED FORTE) 1 % ophthalmic suspension SHAKE LQ AND INT 1 GTT IN OS TID  . ranitidine (ZANTAC) 300 MG tablet Take 150 mg by mouth daily.   . traZODone (DESYREL) 100 MG tablet Take 200 mg by mouth at bedtime as needed for sleep.   . [DISCONTINUED] clindamycin (CLEOCIN) 300 MG capsule Take 1 capsule (300 mg total) by mouth 3 (three) times daily. (Patient not taking: Reported on 11/24/2018)   No facility-administered encounter medications on file as of 11/24/2018.     Surgical History: Past Surgical History:  Procedure Laterality Date  . BREAST BIOPSY Left    core bx- neg  . CESAREAN SECTION    . CHOLECYSTECTOMY    . TONSILLECTOMY    . TUBAL  LIGATION      Medical History: Past Medical History:  Diagnosis Date  . Bipolar disorder ([place])   . Cancer ([place])    skin  . Carotid artery disease ([place])   . COPD (chronic obstructive pulmonary disease) ([place])   . H/O degenerative disc disease   . Hypercholesteremia   . Hypertension   . Stroke [facility])     Family History: Family History  Problem Relation Age of Onset  . Prostate cancer Father   . Stroke Maternal Grandfather   . Stroke Paternal Grandfather   . Breast cancer Neg Hx     Social History: Social History    Socioeconomic History  . Marital status: Divorced    Spouse name: Not on file  . Number of children: Not on file  . Years of education: Not on file  . Highest education level: Not on file  Occupational History  . Not on file  Social Needs  . Financial resource strain: Not on file  . Food insecurity:    Worry: Not on file    Inability: Not on file  . Transportation needs:    Medical: Not on file    Non-medical: Not on file  Tobacco Use  . Smoking status: Current Every Day Smoker  . Smokeless tobacco: Never Used  Substance and Sexual Activity  . Alcohol use: No  . Drug use: No  . Sexual activity: Not on file  Lifestyle  . Physical activity:    Days per week: Not on file    Minutes per session: Not on file  . Stress: Not on file  Relationships  . Social connections:    Talks on phone: Not on file    Gets together: Not on file    Attends religious service: Not on file    Active member of club or organization: Not on file    Attends meetings of clubs or organizations: Not on file    Relationship status: Not on file  . Intimate partner violence:    Fear of current or ex partner: Not on file    Emotionally abused: Not on file    Physically abused: Not on file    Forced sexual activity: Not on file  Other Topics Concern  . Not on file  Social History Narrative  . Not on file    Vital Signs: Blood pressure (!) 149/89, pulse 84, temperature 98.1 F (36.7 C), temperature source Oral, resp. rate 16, height 5\' 4"  (1.626 m), weight 143 lb (64.9 kg), SpO2 95 %.  Examination: General Appearance: The patient is well-developed, well-nourished, and in no distress. Skin: Gross inspection of skin unremarkable. Head: normocephalic, no gross deformities. Eyes: no gross deformities noted. ENT: ears appear grossly normal no exudates. Neck: Supple. No thyromegaly. No LAD. Respiratory: course breath sounds. Cardiovascular: Normal S1 and S2 without murmur or rub. Extremities: No  cyanosis. pulses are equal. Neurologic: Alert and oriented. No involuntary movements.  LABS: No results found for this or any previous visit (from the past 2160 hour(s)).  Radiology: No results found.  No results found.  No results found.    Assessment and Plan: Patient Active Problem List   Diagnosis Date Noted  . PAD (peripheral artery disease) ([place]) 04/22/2018  . Bilateral carotid artery stenosis 02/14/2017  . Essential hypertension 02/14/2017   1. Upper respiratory tract infection, unspecified type Patient provided with Depo-Medrol injection in office today.  Refill patient's DuoNeb nebulizer solution as she reports she is out.  Also given patient  azithromycin course.  Instructed take medication until completion and return to clinic if symptoms fail to improve. - azithromycin (ZITHROMAX) 250 MG tablet; Take as directed.  Dispense: 6 tablet; Refill: 0 - ipratropium-albuterol (DUONEB) 0.5-2.5 (3) MG/3ML SOLN; Take 3 mLs by nebulization every 6 (six) hours as needed (shortness of breath).  Dispense: 360 mL; Refill: 0 - methylPREDNISolone acetate (DEPO-MEDROL) injection 80 mg  2. Cigarette nicotine dependence with nicotine-induced disorder Smoking cessation counseling: 1. Pt acknowledges the risks of long term smoking, she will try to quite smoking. 2. Options for different medications including nicotine products, chewing gum, patch etc, Wellbutrin and Chantix is discussed 3. Goal and date of compete cessation is discussed 4. Total time spent in smoking cessation is 15 min.   3. Essential hypertension BP slightly elevated today 149/89 we will continue to monitor at future visits.  4. Chronic obstructive pulmonary disease, unspecified COPD type ([place]) Stable, continue current medications.  Instructed patient to use nebulizer every 6 hours as needed for cough and shortness of breath.  Instructed her to watch for fever and other symptoms of pneumonia.  5. Dependence on nocturnal  oxygen therapy Patient will continue to use oxygen 2 L via nasal cannula at night.   General Counseling: I have discussed the findings of the evaluation and examination with Blanch Media.  I have also discussed any further diagnostic evaluation thatmay be needed or ordered today. Jolina verbalizes understanding of the findings of todays visit. We also reviewed her medications today and discussed drug interactions and side effects including but not limited excessive drowsiness and altered mental states. We also discussed that there is always a risk not just to her but also people around her. she has been encouraged to call the office with any questions or concerns that should arise related to todays visit.    Time spent: 25 This patient was seen by Orson Gear AGNP-C in Collaboration with Dr. Devona Konig as a part of collaborative care agreement.   I have personally obtained a history, examined the patient, evaluated laboratory and imaging results, formulated the assessment and plan and placed orders.    Allyne Gee, MD [facility] Pulmonary and Critical Care Sleep medicine

## 2018-11-29 NOTE — Addendum Note (Signed)
Addended by: Devona Konig on: 11/29/2018 08:51 PM   Modules accepted: Level of Service

## 2018-12-04 ENCOUNTER — Ambulatory Visit: Payer: Self-pay | Admitting: Adult Health

## 2018-12-08 ENCOUNTER — Other Ambulatory Visit: Payer: Self-pay

## 2018-12-08 DIAGNOSIS — J069 Acute upper respiratory infection, unspecified: Secondary | ICD-10-CM

## 2018-12-08 MED ORDER — AZITHROMYCIN 250 MG PO TABS: ORAL_TABLET | ORAL | 0 refills | Status: DC

## 2018-12-08 NOTE — Telephone Encounter (Signed)
Pt called she saw adam last week she still coughing green mucus as per adam send another of antibiotic for zpak

## 2018-12-29 ENCOUNTER — Other Ambulatory Visit: Payer: Medicaid Other

## 2019-01-02 ENCOUNTER — Other Ambulatory Visit: Payer: Self-pay | Admitting: Internal Medicine

## 2019-03-03 ENCOUNTER — Ambulatory Visit
Admission: RE | Admit: 2019-03-03 | Discharge: 2019-03-03 | Disposition: A | Discharge status: Home / Self Care | Payer: Medicaid Other | Source: Ambulatory Visit | Attending: Pediatrics | Admitting: Pediatrics

## 2019-03-03 ENCOUNTER — Other Ambulatory Visit: Payer: Self-pay

## 2019-03-03 DIAGNOSIS — J439 Emphysema, unspecified: Secondary | ICD-10-CM | POA: Diagnosis present

## 2019-03-03 DIAGNOSIS — E559 Vitamin D deficiency, unspecified: Secondary | ICD-10-CM

## 2019-03-03 DIAGNOSIS — Z8262 Family history of osteoporosis: Secondary | ICD-10-CM | POA: Diagnosis present

## 2019-03-03 DIAGNOSIS — Z1231 Encounter for screening mammogram for malignant neoplasm of breast: Secondary | ICD-10-CM | POA: Insufficient documentation

## 2019-03-19 DIAGNOSIS — M810 Age-related osteoporosis without current pathological fracture: Secondary | ICD-10-CM | POA: Insufficient documentation

## 2019-03-25 ENCOUNTER — Ambulatory Visit: Payer: Medicaid Other | Admitting: Internal Medicine

## 2019-03-29 ENCOUNTER — Ambulatory Visit: Payer: Medicaid Other | Admitting: Internal Medicine

## 2019-04-13 ENCOUNTER — Other Ambulatory Visit: Payer: Self-pay | Admitting: Internal Medicine

## 2019-04-23 ENCOUNTER — Encounter (INDEPENDENT_AMBULATORY_CARE_PROVIDER_SITE_OTHER): Payer: Medicaid Other

## 2019-04-23 ENCOUNTER — Ambulatory Visit (INDEPENDENT_AMBULATORY_CARE_PROVIDER_SITE_OTHER): Payer: Medicaid Other | Admitting: Vascular Surgery

## 2019-06-03 ENCOUNTER — Other Ambulatory Visit: Payer: Self-pay

## 2019-06-03 ENCOUNTER — Ambulatory Visit (INDEPENDENT_AMBULATORY_CARE_PROVIDER_SITE_OTHER): Payer: Medicaid Other | Admitting: Internal Medicine

## 2019-06-03 ENCOUNTER — Encounter: Payer: Self-pay | Admitting: Internal Medicine

## 2019-06-03 VITALS — Ht 64.0 in

## 2019-06-03 DIAGNOSIS — I10 Essential (primary) hypertension: Secondary | ICD-10-CM

## 2019-06-03 DIAGNOSIS — J449 Chronic obstructive pulmonary disease, unspecified: Secondary | ICD-10-CM

## 2019-06-03 DIAGNOSIS — J0100 Acute maxillary sinusitis, unspecified: Secondary | ICD-10-CM

## 2019-06-03 DIAGNOSIS — F17219 Nicotine dependence, cigarettes, with unspecified nicotine-induced disorders: Secondary | ICD-10-CM

## 2019-06-03 MED ORDER — SULFAMETHOXAZOLE-TRIMETHOPRIM 400-80 MG PO TABS: 1.0000 | ORAL_TABLET | Freq: Two times a day (BID) | ORAL | 0 refills | Status: DC

## 2019-06-03 NOTE — Progress Notes (Signed)
[facility] [place], [place] [address]  Internal MEDICINE  Telephone Visit  Patient Name: Sylvia Richardson  [id number]  [id number]  Date of Service: 06/03/2019  I connected with the patient at [phone] by telephone and verified the patients identity using two identifiers.   I discussed the limitations, risks, security and privacy concerns of performing an evaluation and management service by telephone and the availability of in person appointments. I also discussed with the patient that there may be a patient responsible charge related to the service.  The patient expressed understanding and agrees to proceed.    Chief Complaint  Patient presents with  . Telephone Assessment  . Telephone Screen  . Cough    cough up green phlegm  . Ear Pain    inside ears unsure if its dull or sharp  . Fatigue    HPI Pt seen via telephone.  She reports she had a copd flare last week, and then about 4 days ago she started having dizziness, and ear pain, as well as coughing up green mucuos. She reports a mild/fever and chills two days ago, but none since.     Current Medication: Outpatient Encounter Medications as of 06/03/2019  Medication Sig  . albuterol (PROVENTIL HFA;VENTOLIN HFA) 108 (90 Base) MCG/ACT inhaler Inhale 2 puffs into the lungs every 4 (four) hours as needed for wheezing or shortness of breath.  Marland Kitchen aspirin EC 81 MG tablet Take 81 mg by mouth daily.   Marland Kitchen atenolol (TENORMIN) 100 MG tablet take 1/2 to 1 tablet by mouth once daily  . azithromycin (ZITHROMAX) 250 MG tablet Take as directed.  . busPIRone (BUSPAR) 15 MG tablet Take 15 mg by mouth daily.   . clonazePAM (KLONOPIN) 1 MG tablet Take 1 mg by mouth daily as needed for anxiety.   . CYMBALTA 60 MG capsule Take 120 mg by mouth daily.   Marland Kitchen DALIRESP 500 MCG TABS tablet TAKE 1 TABLET BY MOUTH EVERY DAY  . dicyclomine (BENTYL) 20 MG tablet Take 1 tablet (20 mg total) by mouth 3 (three) times daily as needed  (abdominal pain).  Marland Kitchen esomeprazole (NEXIUM) 40 MG capsule Take 40 mg by mouth daily.   . fluticasone (FLONASE) 50 MCG/ACT nasal spray Place into the nose.  Marland Kitchen HYDROcodone-acetaminophen (NORCO) 5-325 MG tablet Take 1 tablet by mouth every 6 (six) hours as needed for moderate pain.  Marland Kitchen ipratropium-albuterol (DUONEB) 0.5-2.5 (3) MG/3ML SOLN Take 3 mLs by nebulization every 6 (six) hours as needed (shortness of breath).  . metoCLOPramide (REGLAN) 10 MG tablet Take 10 mg by mouth every 6 (six) hours as needed for nausea or vomiting.   . montelukast (SINGULAIR) 10 MG tablet Take 10 mg by mouth at bedtime.   . ondansetron (ZOFRAN) 4 MG tablet Take 1 tablet (4 mg total) by mouth every 8 (eight) hours as needed for nausea or vomiting.  . prednisoLONE acetate (PRED FORTE) 1 % ophthalmic suspension SHAKE LQ AND INT 1 GTT IN OS TID  . ranitidine (ZANTAC) 300 MG tablet Take 150 mg by mouth daily.   Marland Kitchen sulfamethoxazole-trimethoprim (BACTRIM) 400-80 MG tablet Take 1 tablet by mouth 2 (two) times daily.  . traZODone (DESYREL) 100 MG tablet Take 200 mg by mouth at bedtime as needed for sleep.    No facility-administered encounter medications on file as of 06/03/2019.     Surgical History: Past Surgical History:  Procedure Laterality Date  . BREAST BIOPSY Left    core bx- neg  .  CESAREAN SECTION    . CHOLECYSTECTOMY    . TONSILLECTOMY    . TUBAL LIGATION      Medical History: Past Medical History:  Diagnosis Date  . Bipolar disorder ([place])   . Cancer ([place])    skin  . Carotid artery disease ([place])   . COPD (chronic obstructive pulmonary disease) ([place])   . H/O degenerative disc disease   . Hypercholesteremia   . Hypertension   . Stroke [facility])     Family History: Family History  Problem Relation Age of Onset  . Prostate cancer Father   . Stroke Maternal Grandfather   . Stroke Paternal Grandfather   . Breast cancer Neg Hx     Social History   Socioeconomic History  . Marital status: Divorced     Spouse name: Not on file  . Number of children: Not on file  . Years of education: Not on file  . Highest education level: Not on file  Occupational History  . Not on file  Social Needs  . Financial resource strain: Not on file  . Food insecurity    Worry: Not on file    Inability: Not on file  . Transportation needs    Medical: Not on file    Non-medical: Not on file  Tobacco Use  . Smoking status: Current Every Day Smoker  . Smokeless tobacco: Never Used  Substance and Sexual Activity  . Alcohol use: No  . Drug use: No  . Sexual activity: Not on file  Lifestyle  . Physical activity    Days per week: Not on file    Minutes per session: Not on file  . Stress: Not on file  Relationships  . Social Herbalist on phone: Not on file    Gets together: Not on file    Attends religious service: Not on file    Active member of club or organization: Not on file    Attends meetings of clubs or organizations: Not on file    Relationship status: Not on file  . Intimate partner violence    Fear of current or ex partner: Not on file    Emotionally abused: Not on file    Physically abused: Not on file    Forced sexual activity: Not on file  Other Topics Concern  . Not on file  Social History Narrative  . Not on file      Review of Systems  Constitutional: Negative for chills, fatigue and unexpected weight change.  HENT: Positive for congestion and postnasal drip. Negative for rhinorrhea, sneezing and sore throat.   Eyes: Negative for photophobia, pain and redness.  Respiratory: Positive for cough, chest tightness and shortness of breath.   Cardiovascular: Negative for chest pain and palpitations.  Gastrointestinal: Negative for abdominal pain, constipation, diarrhea, nausea and vomiting.  Endocrine: Negative.   Genitourinary: Negative for dysuria and frequency.  Musculoskeletal: Negative for arthralgias, back pain, joint swelling and neck pain.  Skin: Negative  for rash.  Allergic/Immunologic: Negative.   Neurological: Negative for tremors and numbness.  Hematological: Negative for adenopathy. Does not bruise/bleed easily.  Psychiatric/Behavioral: Negative for behavioral problems and sleep disturbance. The patient is not nervous/anxious.     Vital Signs: Ht 5\' 4"  (1.626 m)   BMI 24.55 kg/m    Observation/Objective:  Well sounding, NAD at this time.   Assessment/Plan: 1. Acute non-recurrent maxillary sinusitis Advised patient to take entire course of antibiotics as prescribed with food. Pt should return  to clinic in 7-10 days if symptoms fail to improve or new symptoms develop.  - sulfamethoxazole-trimethoprim (BACTRIM) 400-80 MG tablet; Take 1 tablet by mouth 2 (two) times daily.  Dispense: 20 tablet; Refill: 0  2. Chronic obstructive pulmonary disease, unspecified COPD type ([place]) Breathing well at this time.  Continue present management  3. Essential hypertension Controlled, continue present management.   4. Cigarette nicotine dependence with nicotine-induced disorder Smoking cessation counseling: 1. Pt acknowledges the risks of long term smoking, she will try to quite smoking. 2. Options for different medications including nicotine products, chewing gum, patch etc, Wellbutrin and Chantix is discussed 3. Goal and date of compete cessation is discussed 4. Total time spent in smoking cessation is 15 min.   General Counseling: adonna rylant understanding of the findings of today's phone visit and agrees with plan of treatment. I have discussed any further diagnostic evaluation that may be needed or ordered today. We also reviewed her medications today. she has been encouraged to call the office with any questions or concerns that should arise related to todays visit.    No orders of the defined types were placed in this encounter.   Meds ordered this encounter  Medications  . sulfamethoxazole-trimethoprim (BACTRIM) 400-80 MG  tablet    Sig: Take 1 tablet by mouth 2 (two) times daily.    Dispense:  20 tablet    Refill:  0    Time spent: [place] AGNP-C Internal medicine

## 2019-06-07 ENCOUNTER — Telehealth: Payer: Self-pay

## 2019-06-07 NOTE — Telephone Encounter (Signed)
Pt called that bactrim make her acid reflux and vomiting but she is feeling better  as per adam advised stopped med she how is doing take OTC for cough and drink plenty of water

## 2019-06-29 ENCOUNTER — Ambulatory Visit (INDEPENDENT_AMBULATORY_CARE_PROVIDER_SITE_OTHER): Payer: Medicaid Other | Admitting: Vascular Surgery

## 2019-06-29 ENCOUNTER — Encounter (INDEPENDENT_AMBULATORY_CARE_PROVIDER_SITE_OTHER): Payer: Medicaid Other

## 2019-07-07 ENCOUNTER — Other Ambulatory Visit: Payer: Self-pay | Admitting: Adult Health

## 2019-08-19 ENCOUNTER — Telehealth: Payer: Self-pay

## 2019-08-19 NOTE — Telephone Encounter (Signed)
Confirmed appointment with patient. klh °

## 2019-08-23 ENCOUNTER — Other Ambulatory Visit: Payer: Self-pay

## 2019-08-23 ENCOUNTER — Encounter: Payer: Self-pay | Admitting: Internal Medicine

## 2019-08-23 ENCOUNTER — Ambulatory Visit: Payer: Medicaid Other | Admitting: Internal Medicine

## 2019-08-23 VITALS — Ht 64.0 in | Wt 150.0 lb

## 2019-08-23 DIAGNOSIS — Z9981 Dependence on supplemental oxygen: Secondary | ICD-10-CM

## 2019-08-23 DIAGNOSIS — F17219 Nicotine dependence, cigarettes, with unspecified nicotine-induced disorders: Secondary | ICD-10-CM

## 2019-08-23 DIAGNOSIS — J449 Chronic obstructive pulmonary disease, unspecified: Secondary | ICD-10-CM

## 2019-08-23 DIAGNOSIS — R05 Cough: Secondary | ICD-10-CM

## 2019-08-23 DIAGNOSIS — R059 Cough, unspecified: Secondary | ICD-10-CM

## 2019-08-23 NOTE — Progress Notes (Signed)
[facility] [place], [place] [address]  Internal MEDICINE  Telephone Visit  Patient Name: Sylvia Richardson  [id number]  [id number]  Date of Service: 08/23/2019  I connected with the patient at 138 by telephone and verified the patients identity using two identifiers.   I discussed the limitations, risks, security and privacy concerns of performing an evaluation and management service by telephone and the availability of in person appointments. I also discussed with the patient that there may be a patient responsible charge related to the service.  The patient expressed understanding and agrees to proceed.    Chief Complaint  Patient presents with  . Telephone Assessment    coughed up blood about a month ago for a week but has not happened since  . Telephone Screen  . COPD    HPI Pt seen via telephone for follow up.  She reports overall she is doing well.  She reports about one month ago, she had a week of coughing up blood.  She reports it was frank blood at first, but by the end of the week it was mucous streaked with blood.  She denies any other symptoms at that time.  She reports she has continued to cough.  Denies fever, sob or other symptoms.  She is wearing her oxygen at night.      Current Medication: Outpatient Encounter Medications as of 08/23/2019  Medication Sig  . albuterol (PROVENTIL HFA;VENTOLIN HFA) 108 (90 Base) MCG/ACT inhaler Inhale 2 puffs into the lungs every 4 (four) hours as needed for wheezing or shortness of breath.  Marland Kitchen aspirin EC 81 MG tablet Take 81 mg by mouth daily.   Marland Kitchen atenolol (TENORMIN) 100 MG tablet take 1/2 to 1 tablet by mouth once daily  . busPIRone (BUSPAR) 15 MG tablet Take 15 mg by mouth daily.   . clonazePAM (KLONOPIN) 1 MG tablet Take 1 mg by mouth daily as needed for anxiety.   . CYMBALTA 60 MG capsule Take 120 mg by mouth daily.   Marland Kitchen DALIRESP 500 MCG TABS tablet TAKE 1 TABLET BY MOUTH EVERY DAY  . dicyclomine  (BENTYL) 20 MG tablet Take 1 tablet (20 mg total) by mouth 3 (three) times daily as needed (abdominal pain).  Marland Kitchen esomeprazole (NEXIUM) 40 MG capsule Take 40 mg by mouth daily.   . fluticasone (FLONASE) 50 MCG/ACT nasal spray Place into the nose.  Marland Kitchen HYDROcodone-acetaminophen (NORCO) 5-325 MG tablet Take 1 tablet by mouth every 6 (six) hours as needed for moderate pain.  Marland Kitchen ipratropium-albuterol (DUONEB) 0.5-2.5 (3) MG/3ML SOLN Take 3 mLs by nebulization every 6 (six) hours as needed (shortness of breath).  . metoCLOPramide (REGLAN) 10 MG tablet Take 10 mg by mouth every 6 (six) hours as needed for nausea or vomiting.   . montelukast (SINGULAIR) 10 MG tablet Take 10 mg by mouth at bedtime.   . ondansetron (ZOFRAN) 4 MG tablet Take 1 tablet (4 mg total) by mouth every 8 (eight) hours as needed for nausea or vomiting.  . prednisoLONE acetate (PRED FORTE) 1 % ophthalmic suspension SHAKE LQ AND INT 1 GTT IN OS TID  . ranitidine (ZANTAC) 300 MG tablet Take 150 mg by mouth daily.   Marland Kitchen sulfamethoxazole-trimethoprim (BACTRIM) 400-80 MG tablet Take 1 tablet by mouth 2 (two) times daily.  . traZODone (DESYREL) 100 MG tablet Take 200 mg by mouth at bedtime as needed for sleep.   . [DISCONTINUED] azithromycin (ZITHROMAX) 250 MG tablet Take as directed. (Patient not  taking: Reported on 08/23/2019)   No facility-administered encounter medications on file as of 08/23/2019.     Surgical History: Past Surgical History:  Procedure Laterality Date  . BREAST BIOPSY Left    core bx- neg  . CESAREAN SECTION    . CHOLECYSTECTOMY    . TONSILLECTOMY    . TUBAL LIGATION      Medical History: Past Medical History:  Diagnosis Date  . Bipolar disorder ([place])   . Cancer ([place])    skin  . Carotid artery disease ([place])   . COPD (chronic obstructive pulmonary disease) ([place])   . H/O degenerative disc disease   . Hypercholesteremia   . Hypertension   . Stroke [facility])     Family History: Family History  Problem Relation  Age of Onset  . Prostate cancer Father   . Stroke Maternal Grandfather   . Stroke Paternal Grandfather   . Breast cancer Neg Hx     Social History   Socioeconomic History  . Marital status: Divorced    Spouse name: Not on file  . Number of children: Not on file  . Years of education: Not on file  . Highest education level: Not on file  Occupational History  . Not on file  Social Needs  . Financial resource strain: Not on file  . Food insecurity    Worry: Not on file    Inability: Not on file  . Transportation needs    Medical: Not on file    Non-medical: Not on file  Tobacco Use  . Smoking status: Current Every Day Smoker  . Smokeless tobacco: Never Used  Substance and Sexual Activity  . Alcohol use: No  . Drug use: No  . Sexual activity: Not on file  Lifestyle  . Physical activity    Days per week: Not on file    Minutes per session: Not on file  . Stress: Not on file  Relationships  . Social Herbalist on phone: Not on file    Gets together: Not on file    Attends religious service: Not on file    Active member of club or organization: Not on file    Attends meetings of clubs or organizations: Not on file    Relationship status: Not on file  . Intimate partner violence    Fear of current or ex partner: Not on file    Emotionally abused: Not on file    Physically abused: Not on file    Forced sexual activity: Not on file  Other Topics Concern  . Not on file  Social History Narrative  . Not on file      Review of Systems  Constitutional: Negative for chills, fatigue and unexpected weight change.  HENT: Negative for congestion, rhinorrhea, sneezing and sore throat.   Eyes: Negative for photophobia, pain and redness.  Respiratory: Positive for cough. Negative for chest tightness and shortness of breath.   Cardiovascular: Negative for chest pain and palpitations.  Gastrointestinal: Negative for abdominal pain, constipation, diarrhea, nausea and  vomiting.  Endocrine: Negative.   Genitourinary: Negative for dysuria and frequency.  Musculoskeletal: Negative for arthralgias, back pain, joint swelling and neck pain.  Skin: Negative for rash.  Allergic/Immunologic: Negative.   Neurological: Negative for tremors and numbness.  Hematological: Negative for adenopathy. Does not bruise/bleed easily.  Psychiatric/Behavioral: Negative for behavioral problems and sleep disturbance. The patient is not nervous/anxious.     Vital Signs: Ht 5\' 4"  (1.626 m)  Wt 150 lb (68 kg)   BMI 25.75 kg/m    Observation/Objective:  Well sounding, NAD noted.    Assessment/Plan: 1. Chronic obstructive pulmonary disease, unspecified COPD type ([place]) Stable currently. Continue to use inhalers and duonebs as needed.   2. Cough Get CXR for evaluation of cough and hemoptysis.  She will likely need a CT chest as she is a smoker.  - DG Chest 2 View; Future  3. Dependence on nocturnal oxygen therapy Continue to use oxygen as discussed.  4. Cigarette nicotine dependence with nicotine-induced disorder Smoking cessation counseling: 1. Pt acknowledges the risks of long term smoking, she will try to quite smoking. 2. Options for different medications including nicotine products, chewing gum, patch etc, Wellbutrin and Chantix is discussed 3. Goal and date of compete cessation is discussed 4. Total time spent in smoking cessation is 15 min.   General Counseling: dianey belarde understanding of the findings of today's phone visit and agrees with plan of treatment. I have discussed any further diagnostic evaluation that may be needed or ordered today. We also reviewed her medications today. she has been encouraged to call the office with any questions or concerns that should arise related to todays visit.    Orders Placed This Encounter  Procedures  . DG Chest 2 View    No orders of the defined types were placed in this encounter.   Time spent: 15  Minutes    Orson Gear AGNP-C Pulmonary medicine

## 2019-08-27 ENCOUNTER — Telehealth: Payer: Self-pay

## 2019-08-27 NOTE — Telephone Encounter (Signed)
Confirmed appointment with patient. klh °

## 2019-08-30 ENCOUNTER — Telehealth: Payer: Self-pay

## 2019-08-30 NOTE — Telephone Encounter (Signed)
Rescheduled appointment on 08/31/2019 to 09/14/2019, patient still needed to have xray done. klh

## 2019-08-31 ENCOUNTER — Ambulatory Visit: Payer: Medicaid Other | Admitting: Internal Medicine

## 2019-09-08 ENCOUNTER — Telehealth: Payer: Self-pay

## 2019-09-09 NOTE — Telephone Encounter (Signed)
Confirmed appointment with patient. klh °

## 2019-09-13 ENCOUNTER — Telehealth: Payer: Self-pay

## 2019-09-13 NOTE — Telephone Encounter (Signed)
Patient out of town rescheduled appointment on 09/14/2019 to 09/23/2019. klh

## 2019-09-14 ENCOUNTER — Ambulatory Visit: Payer: Medicaid Other | Admitting: Internal Medicine

## 2019-09-23 ENCOUNTER — Ambulatory Visit: Payer: Medicaid Other | Admitting: Internal Medicine

## 2019-09-23 ENCOUNTER — Encounter: Payer: Self-pay | Admitting: Internal Medicine

## 2019-10-07 ENCOUNTER — Telehealth: Payer: Self-pay

## 2019-10-11 ENCOUNTER — Ambulatory Visit: Payer: Medicaid Other | Admitting: Internal Medicine

## 2019-10-20 ENCOUNTER — Other Ambulatory Visit: Payer: Self-pay

## 2019-10-20 ENCOUNTER — Emergency Department
Admission: EM | Admit: 2019-10-20 | Discharge: 2019-10-20 | Disposition: A | Discharge status: Home / Self Care | Payer: Medicaid Other | Attending: Emergency Medicine | Admitting: Emergency Medicine

## 2019-10-20 ENCOUNTER — Encounter: Payer: Self-pay | Admitting: Emergency Medicine

## 2019-10-20 DIAGNOSIS — I2510 Atherosclerotic heart disease of native coronary artery without angina pectoris: Secondary | ICD-10-CM | POA: Insufficient documentation

## 2019-10-20 DIAGNOSIS — R251 Tremor, unspecified: Secondary | ICD-10-CM | POA: Diagnosis not present

## 2019-10-20 DIAGNOSIS — J449 Chronic obstructive pulmonary disease, unspecified: Secondary | ICD-10-CM | POA: Insufficient documentation

## 2019-10-20 DIAGNOSIS — Z8673 Personal history of transient ischemic attack (TIA), and cerebral infarction without residual deficits: Secondary | ICD-10-CM | POA: Insufficient documentation

## 2019-10-20 DIAGNOSIS — I10 Essential (primary) hypertension: Secondary | ICD-10-CM | POA: Diagnosis not present

## 2019-10-20 DIAGNOSIS — F17200 Nicotine dependence, unspecified, uncomplicated: Secondary | ICD-10-CM | POA: Diagnosis not present

## 2019-10-20 DIAGNOSIS — R531 Weakness: Secondary | ICD-10-CM

## 2019-10-20 DIAGNOSIS — Z20822 Contact with and (suspected) exposure to covid-19: Secondary | ICD-10-CM | POA: Insufficient documentation

## 2019-10-20 DIAGNOSIS — Z85828 Personal history of other malignant neoplasm of skin: Secondary | ICD-10-CM | POA: Insufficient documentation

## 2019-10-20 DIAGNOSIS — Z9049 Acquired absence of other specified parts of digestive tract: Secondary | ICD-10-CM | POA: Insufficient documentation

## 2019-10-20 LAB — CBC
HCT: 46.4 % — ABNORMAL HIGH (ref 36.0–46.0)
Hemoglobin: 15.5 g/dL — ABNORMAL HIGH (ref 12.0–15.0)
MCH: 28.5 pg (ref 26.0–34.0)
MCHC: 33.4 g/dL (ref 30.0–36.0)
MCV: 85.3 fL (ref 80.0–100.0)
Platelets: 244 10*3/uL (ref 150–400)
RBC: 5.44 MIL/uL — ABNORMAL HIGH (ref 3.87–5.11)
RDW: 13.6 % (ref 11.5–15.5)
WBC: 6.4 10*3/uL (ref 4.0–10.5)
nRBC: 0 % (ref 0.0–0.2)

## 2019-10-20 LAB — URINALYSIS, COMPLETE (UACMP) WITH MICROSCOPIC
Bacteria, UA: NONE SEEN
Bilirubin Urine: NEGATIVE
Glucose, UA: NEGATIVE mg/dL
Ketones, ur: NEGATIVE mg/dL
Leukocytes,Ua: NEGATIVE
Nitrite: NEGATIVE
Protein, ur: NEGATIVE mg/dL
Specific Gravity, Urine: 1.004 — ABNORMAL LOW (ref 1.005–1.030)
pH: 6 (ref 5.0–8.0)

## 2019-10-20 LAB — BASIC METABOLIC PANEL
Anion gap: 4 — ABNORMAL LOW (ref 5–15)
BUN: 14 mg/dL (ref 8–23)
CO2: 33 mmol/L — ABNORMAL HIGH (ref 22–32)
Calcium: 9.9 mg/dL (ref 8.9–10.3)
Chloride: 97 mmol/L — ABNORMAL LOW (ref 98–111)
Creatinine, Ser: 0.58 mg/dL (ref 0.44–1.00)
GFR calc Af Amer: >60 mL/min (ref >60)
GFR calc non Af Amer: >60 mL/min (ref >60)
Glucose, Bld: 101 mg/dL — ABNORMAL HIGH (ref 70–99)
Potassium: 4.4 mmol/L (ref 3.5–5.1)
Sodium: 134 mmol/L — ABNORMAL LOW (ref 135–145)

## 2019-10-20 LAB — RESPIRATORY PANEL BY RT PCR (FLU A&B, COVID)
Influenza A by PCR: NEGATIVE
Influenza B by PCR: NEGATIVE
SARS Coronavirus 2 by RT PCR: NEGATIVE

## 2019-10-20 LAB — TROPONIN I (HIGH SENSITIVITY): Troponin I (High Sensitivity): <2 ng/L (ref <18)

## 2019-10-20 MED ORDER — SODIUM CHLORIDE 0.9 % IV SOLN: Freq: Once | INTRAVENOUS | Status: AC

## 2019-10-20 MED ORDER — SODIUM CHLORIDE 0.9% FLUSH: 3.0000 mL | Freq: Once | INTRAVENOUS | Status: DC

## 2019-10-20 NOTE — ED Provider Notes (Signed)
[facility] Emergency Department Provider Note       Time seen: ----------------------------------------- 1:10 PM on 10/20/2019 -----------------------------------------   I have reviewed the triage vital signs and the nursing notes.  HISTORY  Chief Complaint Weakness    HPI Sylvia Richardson is a 64 y.o. female with a history of bipolar disorder, carotid artery disease, COPD, hyperlipidemia, hypertension, CVA who presents to the ED for weakness and shaking this morning.  Patient states she had a similar episode last week.  She denies any pain.  She has not had any recent illness, states she does not eat well due to poor appetite, does not sleep well, has few pleasurable activities.  Past Medical History:  Diagnosis Date  . Bipolar disorder ([place])   . Cancer ([place])    skin  . Carotid artery disease ([place])   . COPD (chronic obstructive pulmonary disease) ([place])   . H/O degenerative disc disease   . Hypercholesteremia   . Hypertension   . Stroke [facility])     Patient Active Problem List   Diagnosis Date Noted  . PAD (peripheral artery disease) ([place]) 04/22/2018  . Bilateral carotid artery stenosis 02/14/2017  . Essential hypertension 02/14/2017    Past Surgical History:  Procedure Laterality Date  . BREAST BIOPSY Left    core bx- neg  . CESAREAN SECTION    . CHOLECYSTECTOMY    . TONSILLECTOMY    . TUBAL LIGATION      Allergies Bactrim [sulfamethoxazole-trimethoprim], Ciprofloxacin, Clindamycin/lincomycin, Choline fenofibrate, Niacin, Nifedipine, Rosuvastatin, Doxycycline, Fish oil, Levaquin [levofloxacin], and Penicillins  Social History Social History   Tobacco Use  . Smoking status: Current Every Day Smoker  . Smokeless tobacco: Never Used  Substance Use Topics  . Alcohol use: No  . Drug use: No    Review of Systems Constitutional: Negative for fever. Cardiovascular: Negative for chest pain. Respiratory: Negative for shortness of  breath. Gastrointestinal: Negative for abdominal pain, vomiting and diarrhea. Musculoskeletal: Negative for back pain. Skin: Negative for rash. Neurological: Negative for headaches, positive for weakness, shaking  All systems negative/normal/unremarkable except as stated in the HPI  ____________________________________________   PHYSICAL EXAM:  VITAL SIGNS: ED Triage Vitals  Enc Vitals Group     BP 10/20/19 1106 (!) 149/71     Pulse Rate 10/20/19 1106 84     Resp 10/20/19 1106 16     Temp 10/20/19 1106 98.5 F (36.9 C)     Temp Source 10/20/19 1106 Oral     SpO2 10/20/19 1106 94 %     Weight 10/20/19 1103 147 lb (66.7 kg)     Height 10/20/19 1103 5\' 4"  (1.626 m)     Head Circumference --      Peak Flow --      Pain Score 10/20/19 1103 0     Pain Loc --      Pain Edu? --      Excl. in [place]? --    Constitutional: Alert and oriented.  Depressed appearing Eyes: Conjunctivae are injected on the left side.  Normal extraocular movements. ENT      Head: Normocephalic and atraumatic.      Nose: No congestion/rhinnorhea.      Mouth/Throat: Mucous membranes are moist.      Neck: No stridor. Cardiovascular: Normal rate, regular rhythm. No murmurs, rubs, or gallops. Respiratory: Normal respiratory effort without tachypnea nor retractions. Breath sounds are clear and equal bilaterally. No wheezes/rales/rhonchi. Gastrointestinal: Soft and nontender. Normal bowel sounds Musculoskeletal: Nontender  with normal range of motion in extremities. No lower extremity tenderness nor edema. Neurologic:  Normal speech and language. No gross focal neurologic deficits are appreciated.  Skin:  Skin is warm, dry and intact. No rash noted. Psychiatric: Flat affect ____________________________________________  EKG: Interpreted by me.  Sinus rhythm with rate 86 bpm, normal PR interval, normal QRS, normal QT  ____________________________________________  ED COURSE:  As part of my medical decision  making, I reviewed the following data within the [place] History obtained from family if available, nursing notes, old chart and ekg, as well as notes from prior ED visits. Patient presented for weakness, we will assess with labs and imaging as indicated at this time.   Procedures  Thai Gebre was evaluated in Emergency Department on 10/20/2019 for the symptoms described in the history of present illness. She was evaluated in the context of the global COVID-19 pandemic, which necessitated consideration that the patient might be at risk for infection with the SARS-CoV-2 virus that causes COVID-19. Institutional protocols and algorithms that pertain to the evaluation of patients at risk for COVID-19 are in a state of rapid change based on information released by regulatory bodies including the CDC and federal and state organizations. These policies and algorithms were followed during the patient's care in the ED.  ____________________________________________   LABS (pertinent positives/negatives)  Labs Reviewed  BASIC METABOLIC PANEL - Abnormal; Notable for the following components:      Result Value   Sodium 134 (*)    Chloride 97 (*)    CO2 33 (*)    Glucose, Bld 101 (*)    Anion gap 4 (*)    All other components within normal limits  CBC - Abnormal; Notable for the following components:   RBC 5.44 (*)    Hemoglobin 15.5 (*)    HCT 46.4 (*)    All other components within normal limits  RESPIRATORY PANEL BY RT PCR (FLU A&B, COVID)  URINALYSIS, COMPLETE (UACMP) WITH MICROSCOPIC  CBG MONITORING, ED  TROPONIN I (HIGH SENSITIVITY)  ____________________________________________   DIFFERENTIAL DIAGNOSIS   Depression, anxiety, dehydration, electrolyte abnormality, medication noncompliance  FINAL ASSESSMENT AND PLAN  Weakness, shaking   Plan: The patient had presented for an episode of weakness and shaking, this also occurred last week.  Patient appears to be  significantly depressed with intermittent anxiety.  Patient's labs were overall reassuring.  I discussed her medications with her at length and do not feel comfortable making any changes at this time.  She did receive IV fluids here, I have encouraged close outpatient follow-up with her doctor.   Laurence Aly, MD    Note: This note was generated in part or whole with voice recognition software. Voice recognition is usually quite accurate but there are transcription errors that can and very often do occur. I apologize for any typographical errors that were not detected and corrected.  Earleen Newport, MD 10/20/19 [phone]

## 2019-10-20 NOTE — ED Triage Notes (Signed)
Arrives via [facility] with C/O weakness, shaking this morning.  States had similar episode last week.  Per EMS report, VSS.  Stroke scale negative.  Patient arrives AAOx3.  Skin warm and dry.  MAE equally and strong.  NAD

## 2019-11-09 ENCOUNTER — Telehealth: Payer: Self-pay

## 2019-11-09 NOTE — Telephone Encounter (Signed)
Confirmed telephone visit on 11/11/2019. klh

## 2019-11-10 ENCOUNTER — Other Ambulatory Visit: Payer: Self-pay

## 2019-11-10 ENCOUNTER — Ambulatory Visit
Admission: RE | Admit: 2019-11-10 | Discharge: 2019-11-10 | Disposition: A | Discharge status: Home / Self Care | Payer: Medicaid Other | Source: Ambulatory Visit | Attending: Adult Health | Admitting: Adult Health

## 2019-11-10 DIAGNOSIS — R05 Cough: Secondary | ICD-10-CM

## 2019-11-10 DIAGNOSIS — R059 Cough, unspecified: Secondary | ICD-10-CM

## 2019-11-11 ENCOUNTER — Encounter: Payer: Self-pay | Admitting: Internal Medicine

## 2019-11-11 ENCOUNTER — Ambulatory Visit: Payer: Medicaid Other | Admitting: Internal Medicine

## 2019-11-11 VITALS — Resp 16 | Ht 64.0 in | Wt 147.0 lb

## 2019-11-11 DIAGNOSIS — Z9981 Dependence on supplemental oxygen: Secondary | ICD-10-CM

## 2019-11-11 DIAGNOSIS — F17219 Nicotine dependence, cigarettes, with unspecified nicotine-induced disorders: Secondary | ICD-10-CM

## 2019-11-11 DIAGNOSIS — R05 Cough: Secondary | ICD-10-CM

## 2019-11-11 DIAGNOSIS — R059 Cough, unspecified: Secondary | ICD-10-CM

## 2019-11-11 DIAGNOSIS — J449 Chronic obstructive pulmonary disease, unspecified: Secondary | ICD-10-CM

## 2019-11-11 DIAGNOSIS — R0609 Other forms of dyspnea: Secondary | ICD-10-CM

## 2019-11-11 DIAGNOSIS — R0600 Dyspnea, unspecified: Secondary | ICD-10-CM

## 2019-11-11 MED ORDER — SPIRIVA HANDIHALER 18 MCG IN CAPS: 18.0000 ug | ORAL_CAPSULE | RESPIRATORY_TRACT | 2 refills | Status: DC

## 2019-11-11 NOTE — Progress Notes (Signed)
[facility] [place], [place] [address]  Internal MEDICINE  Telephone Visit  Patient Name: Sylvia Richardson  [id number]  GN:[id number]  Date of Service: 11/11/2019  I connected with the patient at 1111 by telephone and verified the patients identity using two identifiers.   I discussed the limitations, risks, security and privacy concerns of performing an evaluation and management service by telephone and the availability of in person appointments. I also discussed with the patient that there may be a patient responsible charge related to the service.  The patient expressed understanding and agrees to proceed.    Chief Complaint  Patient presents with  . Telephone Assessment  . Telephone Screen  . Follow-up    review x-ray  . Hypertension  . Hyperlipidemia    HPI  Pt is seen via telephone. She was seen on December 7th 2020 and was complaining of a few episodes of coughing up blood streaked mucous.  She was sent for CXR but was unable to do it until yesterday.  The CXR is normal.  She reports she has not had any more issues with coughing up blood.  She reports currently feeling like herself, and denies any overt sob or coughing. She unfortunately continues to smoke 1 PPD.  She is wearing her oxygen at night however she reports waking up at night with sob at times. She denies any swelling in her feet.  She reports her doe has worsened over the last year. She was previously on Spiriva but has run out.       Current Medication: Outpatient Encounter Medications as of 11/11/2019  Medication Sig  . aspirin EC 81 MG tablet Take 81 mg by mouth daily.   Marland Kitchen atenolol (TENORMIN) 100 MG tablet take 1/2 to 1 tablet by mouth once daily  . busPIRone (BUSPAR) 15 MG tablet Take 15 mg by mouth daily.   . clonazePAM (KLONOPIN) 1 MG tablet Take 1 mg by mouth daily as needed for anxiety.   . CYMBALTA 60 MG capsule Take 120 mg by mouth daily.   Marland Kitchen DALIRESP 500 MCG TABS tablet TAKE  1 TABLET BY MOUTH EVERY DAY  . dicyclomine (BENTYL) 20 MG tablet Take 1 tablet (20 mg total) by mouth 3 (three) times daily as needed (abdominal pain).  Marland Kitchen esomeprazole (NEXIUM) 40 MG capsule Take 40 mg by mouth daily.   . fluticasone (FLONASE) 50 MCG/ACT nasal spray Place into the nose.  Marland Kitchen HYDROcodone-acetaminophen (NORCO) 5-325 MG tablet Take 1 tablet by mouth every 6 (six) hours as needed for moderate pain.  Marland Kitchen ipratropium-albuterol (DUONEB) 0.5-2.5 (3) MG/3ML SOLN Take 3 mLs by nebulization every 6 (six) hours as needed (shortness of breath).  . metoCLOPramide (REGLAN) 10 MG tablet Take 10 mg by mouth every 6 (six) hours as needed for nausea or vomiting.   . montelukast (SINGULAIR) 10 MG tablet Take 10 mg by mouth at bedtime.   . ondansetron (ZOFRAN) 4 MG tablet Take 1 tablet (4 mg total) by mouth every 8 (eight) hours as needed for nausea or vomiting.  . prednisoLONE acetate (PRED FORTE) 1 % ophthalmic suspension SHAKE LQ AND INT 1 GTT IN OS TID  . ranitidine (ZANTAC) 300 MG tablet Take 150 mg by mouth daily.   Marland Kitchen sulfamethoxazole-trimethoprim (BACTRIM) 400-80 MG tablet Take 1 tablet by mouth 2 (two) times daily.  . traZODone (DESYREL) 100 MG tablet Take 200 mg by mouth at bedtime as needed for sleep.   Marland Kitchen albuterol (PROVENTIL HFA;VENTOLIN HFA)  108 (90 Base) MCG/ACT inhaler Inhale 2 puffs into the lungs every 4 (four) hours as needed for wheezing or shortness of breath.  . tiotropium (SPIRIVA HANDIHALER) 18 MCG inhalation capsule Place 1 capsule (18 mcg total) into inhaler and inhale 1 day or 1 dose.   No facility-administered encounter medications on file as of 11/11/2019.    Surgical History: Past Surgical History:  Procedure Laterality Date  . BREAST BIOPSY Left    core bx- neg  . CESAREAN SECTION    . CHOLECYSTECTOMY    . TONSILLECTOMY    . TUBAL LIGATION      Medical History: Past Medical History:  Diagnosis Date  . Bipolar disorder ([place])   . Cancer ([place])    skin  . Carotid  artery disease ([place])   . COPD (chronic obstructive pulmonary disease) ([place])   . H/O degenerative disc disease   . Hypercholesteremia   . Hypertension   . Stroke [facility])     Family History: Family History  Problem Relation Age of Onset  . Prostate cancer Father   . Stroke Maternal Grandfather   . Stroke Paternal Grandfather   . Breast cancer Neg Hx     Social History   Socioeconomic History  . Marital status: Divorced    Spouse name: Not on file  . Number of children: Not on file  . Years of education: Not on file  . Highest education level: Not on file  Occupational History  . Not on file  Tobacco Use  . Smoking status: Current Every Day Smoker  . Smokeless tobacco: Never Used  Substance and Sexual Activity  . Alcohol use: No  . Drug use: No  . Sexual activity: Not on file  Other Topics Concern  . Not on file  Social History Narrative  . Not on file   Social Determinants of Health   Financial Resource Strain:   . Difficulty of Paying Living Expenses: Not on file  Food Insecurity:   . Worried About Charity fundraiser in the Last Year: Not on file  . Ran Out of Food in the Last Year: Not on file  Transportation Needs:   . Lack of Transportation (Medical): Not on file  . Lack of Transportation (Non-Medical): Not on file  Physical Activity:   . Days of Exercise per Week: Not on file  . Minutes of Exercise per Session: Not on file  Stress:   . Feeling of Stress : Not on file  Social Connections:   . Frequency of Communication with Friends and Family: Not on file  . Frequency of Social Gatherings with Friends and Family: Not on file  . Attends Religious Services: Not on file  . Active Member of Clubs or Organizations: Not on file  . Attends Archivist Meetings: Not on file  . Marital Status: Not on file  Intimate Partner Violence:   . Fear of Current or Ex-Partner: Not on file  . Emotionally Abused: Not on file  . Physically Abused: Not on file  .  Sexually Abused: Not on file      Review of Systems  Constitutional: Negative for chills, fatigue and unexpected weight change.  HENT: Negative for congestion, rhinorrhea, sneezing and sore throat.   Eyes: Negative for photophobia, pain and redness.  Respiratory: Negative for cough, chest tightness and shortness of breath.   Cardiovascular: Negative for chest pain and palpitations.  Gastrointestinal: Negative for abdominal pain, constipation, diarrhea, nausea and vomiting.  Endocrine: Negative.  Genitourinary: Negative for dysuria and frequency.  Musculoskeletal: Negative for arthralgias, back pain, joint swelling and neck pain.  Skin: Negative for rash.  Allergic/Immunologic: Negative.   Neurological: Negative for tremors and numbness.  Hematological: Negative for adenopathy. Does not bruise/bleed easily.  Psychiatric/Behavioral: Negative for behavioral problems and sleep disturbance. The patient is not nervous/anxious.     Vital Signs: Resp 16   Ht 5\' 4"  (1.626 m)   Wt 147 lb (66.7 kg)   BMI 25.23 kg/m    Observation/Objective:  Well sounding, NAD noted.    Assessment/Plan: 1. Chronic obstructive pulmonary disease, unspecified COPD type ([place]) Restart Spiriva and follow up after echo. Will consider Chest CT at that time if DOE continues.   2. Cough Pt continues to have intermittent cough, however no blood since initial issue. CXR was normal.  3. Dependence on nocturnal oxygen therapy Continue to use oxygen at night.  4. Cigarette nicotine dependence with nicotine-induced disorder Smoking cessation counseling: 1. Pt acknowledges the risks of long term smoking, she will try to quite smoking. 2. Options for different medications including nicotine products, chewing gum, patch etc, Wellbutrin and Chantix is discussed 3. Goal and date of compete cessation is discussed 4. Total time spent in smoking cessation is 15 min.  5. Dyspnea on exertion Needs echo for increased  DOE - ECHOCARDIOGRAM COMPLETE; Future  General Counseling: jessyka atlas understanding of the findings of today's phone visit and agrees with plan of treatment. I have discussed any further diagnostic evaluation that may be needed or ordered today. We also reviewed her medications today. she has been encouraged to call the office with any questions or concerns that should arise related to todays visit.    Orders Placed This Encounter  Procedures  . ECHOCARDIOGRAM COMPLETE    Meds ordered this encounter  Medications  . tiotropium (SPIRIVA HANDIHALER) 18 MCG inhalation capsule    Sig: Place 1 capsule (18 mcg total) into inhaler and inhale 1 day or 1 dose.    Dispense:  30 capsule    Refill:  2    Time spent: 20 Minutes    Orson Gear AGNP-C Pulmonary medicine.

## 2019-11-17 ENCOUNTER — Other Ambulatory Visit: Payer: Self-pay | Admitting: Adult Health

## 2019-11-26 ENCOUNTER — Other Ambulatory Visit: Payer: Medicaid Other

## 2019-12-02 ENCOUNTER — Telehealth: Payer: Self-pay | Admitting: Internal Medicine

## 2019-12-03 ENCOUNTER — Other Ambulatory Visit: Payer: Medicaid Other

## 2019-12-07 ENCOUNTER — Ambulatory Visit: Payer: Medicaid Other | Admitting: Adult Health

## 2019-12-14 ENCOUNTER — Telehealth: Payer: Self-pay

## 2019-12-14 NOTE — Telephone Encounter (Signed)
Confirmed virtual visit on 12/16/2019. klh 

## 2019-12-16 ENCOUNTER — Encounter: Payer: Self-pay | Admitting: Adult Health

## 2019-12-16 ENCOUNTER — Ambulatory Visit (INDEPENDENT_AMBULATORY_CARE_PROVIDER_SITE_OTHER): Payer: Medicaid Other | Admitting: Adult Health

## 2019-12-16 VITALS — Wt 143.0 lb

## 2019-12-23 NOTE — Telephone Encounter (Signed)
Patient has rescheduled echo ultrasound.

## 2020-01-05 ENCOUNTER — Telehealth: Payer: Self-pay

## 2020-01-05 NOTE — Telephone Encounter (Signed)
Confirmed patient for ultrasound

## 2020-01-07 ENCOUNTER — Other Ambulatory Visit: Payer: Self-pay

## 2020-01-07 ENCOUNTER — Ambulatory Visit: Payer: Medicaid Other

## 2020-01-07 DIAGNOSIS — R0609 Other forms of dyspnea: Secondary | ICD-10-CM

## 2020-01-07 DIAGNOSIS — R0602 Shortness of breath: Secondary | ICD-10-CM

## 2020-01-11 ENCOUNTER — Telehealth: Payer: Self-pay

## 2020-01-11 NOTE — Telephone Encounter (Signed)
Confirmed virtual visit on 01/13/2020. klh

## 2020-01-13 ENCOUNTER — Encounter: Payer: Self-pay | Admitting: Internal Medicine

## 2020-01-13 ENCOUNTER — Ambulatory Visit: Payer: Medicaid Other | Admitting: Internal Medicine

## 2020-01-13 VITALS — Resp 16 | Ht 64.0 in | Wt 146.0 lb

## 2020-01-13 DIAGNOSIS — G4719 Other hypersomnia: Secondary | ICD-10-CM

## 2020-01-13 DIAGNOSIS — I5189 Other ill-defined heart diseases: Secondary | ICD-10-CM

## 2020-01-13 DIAGNOSIS — Z9981 Dependence on supplemental oxygen: Secondary | ICD-10-CM

## 2020-01-13 DIAGNOSIS — I10 Essential (primary) hypertension: Secondary | ICD-10-CM

## 2020-01-13 DIAGNOSIS — F17219 Nicotine dependence, cigarettes, with unspecified nicotine-induced disorders: Secondary | ICD-10-CM

## 2020-01-13 DIAGNOSIS — J449 Chronic obstructive pulmonary disease, unspecified: Secondary | ICD-10-CM

## 2020-01-13 DIAGNOSIS — I519 Heart disease, unspecified: Secondary | ICD-10-CM

## 2020-01-13 NOTE — Progress Notes (Signed)
[facility] [place], [place] [address]  Internal MEDICINE  Telephone Visit  Patient Name: Sylvia Richardson  [id number]  GN:[id number]  Date of Service: 01/14/2020  I connected with the patient at 257 by telephone and verified the patients identity using two identifiers.   I discussed the limitations, risks, security and privacy concerns of performing an evaluation and management service by telephone and the availability of in person appointments. I also discussed with the patient that there may be a patient responsible charge related to the service.  The patient expressed understanding and agrees to proceed.    Chief Complaint  Patient presents with  . Telephone Screen    echo results  . Telephone Assessment    HPI  Pt seen via telephone. Pt is following up on echo.  Her Echo shows diastolic dysfunction.  She denies ever having a sleep study.  She does have loud snoring.  She has excessive daytime fatigue. She Takes multiple naps a day, and reports she sleeps most nights for around 5-6 hours.  She wears oxygen at night currently on 2 Lpm. She is using her inhalers regularly.     Current Medication: Outpatient Encounter Medications as of 01/13/2020  Medication Sig  . aspirin EC 81 MG tablet Take 81 mg by mouth daily.   Marland Kitchen atenolol (TENORMIN) 100 MG tablet take 1/2 to 1 tablet by mouth once daily  . busPIRone (BUSPAR) 15 MG tablet Take 15 mg by mouth daily.   . clonazePAM (KLONOPIN) 1 MG tablet Take 1 mg by mouth daily as needed for anxiety.   . CYMBALTA 60 MG capsule Take 120 mg by mouth daily.   Marland Kitchen DALIRESP 500 MCG TABS tablet TAKE 1 TABLET BY MOUTH EVERY DAY  . dicyclomine (BENTYL) 20 MG tablet Take 1 tablet (20 mg total) by mouth 3 (three) times daily as needed (abdominal pain).  Marland Kitchen esomeprazole (NEXIUM) 40 MG capsule Take 40 mg by mouth daily.   . fluticasone (FLONASE) 50 MCG/ACT nasal spray Place into the nose.  Marland Kitchen HYDROcodone-acetaminophen (NORCO)  5-325 MG tablet Take 1 tablet by mouth every 6 (six) hours as needed for moderate pain.  Marland Kitchen ipratropium-albuterol (DUONEB) 0.5-2.5 (3) MG/3ML SOLN Take 3 mLs by nebulization every 6 (six) hours as needed (shortness of breath).  . metoCLOPramide (REGLAN) 10 MG tablet Take 10 mg by mouth every 6 (six) hours as needed for nausea or vomiting.   . montelukast (SINGULAIR) 10 MG tablet Take 10 mg by mouth at bedtime.   . ondansetron (ZOFRAN) 4 MG tablet Take 1 tablet (4 mg total) by mouth every 8 (eight) hours as needed for nausea or vomiting.  . prednisoLONE acetate (PRED FORTE) 1 % ophthalmic suspension SHAKE LQ AND INT 1 GTT IN OS TID  . ranitidine (ZANTAC) 300 MG tablet Take 150 mg by mouth daily.   Marland Kitchen sulfamethoxazole-trimethoprim (BACTRIM) 400-80 MG tablet Take 1 tablet by mouth 2 (two) times daily.  Marland Kitchen tiotropium (SPIRIVA HANDIHALER) 18 MCG inhalation capsule Place 1 capsule (18 mcg total) into inhaler and inhale 1 day or 1 dose.  . traZODone (DESYREL) 100 MG tablet Take 200 mg by mouth at bedtime as needed for sleep.   Marland Kitchen albuterol (PROVENTIL HFA;VENTOLIN HFA) 108 (90 Base) MCG/ACT inhaler Inhale 2 puffs into the lungs every 4 (four) hours as needed for wheezing or shortness of breath.   No facility-administered encounter medications on file as of 01/13/2020.    Surgical History: Past Surgical History:  Procedure  Laterality Date  . BREAST BIOPSY Left    core bx- neg  . CESAREAN SECTION    . CHOLECYSTECTOMY    . TONSILLECTOMY    . TUBAL LIGATION      Medical History: Past Medical History:  Diagnosis Date  . Bipolar disorder ([place])   . Cancer ([place])    skin  . Carotid artery disease ([place])   . COPD (chronic obstructive pulmonary disease) ([place])   . H/O degenerative disc disease   . Hypercholesteremia   . Hypertension   . Stroke [facility])     Family History: Family History  Problem Relation Age of Onset  . Prostate cancer Father   . Stroke Maternal Grandfather   . Stroke Paternal  Grandfather   . Breast cancer Neg Hx     Social History   Socioeconomic History  . Marital status: Divorced    Spouse name: Not on file  . Number of children: Not on file  . Years of education: Not on file  . Highest education level: Not on file  Occupational History  . Not on file  Tobacco Use  . Smoking status: Current Every Day Smoker  . Smokeless tobacco: Never Used  Substance and Sexual Activity  . Alcohol use: No  . Drug use: No  . Sexual activity: Not on file  Other Topics Concern  . Not on file  Social History Narrative  . Not on file   Social Determinants of Health   Financial Resource Strain:   . Difficulty of Paying Living Expenses:   Food Insecurity:   . Worried About Charity fundraiser in the Last Year:   . Arboriculturist in the Last Year:   Transportation Needs:   . Film/video editor (Medical):   Marland Kitchen Lack of Transportation (Non-Medical):   Physical Activity:   . Days of Exercise per Week:   . Minutes of Exercise per Session:   Stress:   . Feeling of Stress :   Social Connections:   . Frequency of Communication with Friends and Family:   . Frequency of Social Gatherings with Friends and Family:   . Attends Religious Services:   . Active Member of Clubs or Organizations:   . Attends Archivist Meetings:   Marland Kitchen Marital Status:   Intimate Partner Violence:   . Fear of Current or Ex-Partner:   . Emotionally Abused:   Marland Kitchen Physically Abused:   . Sexually Abused:       Review of Systems  Constitutional: Negative for chills, fatigue and unexpected weight change.  HENT: Negative for congestion, rhinorrhea, sneezing and sore throat.   Eyes: Negative for photophobia, pain and redness.  Respiratory: Negative for cough, chest tightness and shortness of breath.   Cardiovascular: Negative for chest pain and palpitations.  Gastrointestinal: Negative for abdominal pain, constipation, diarrhea, nausea and vomiting.  Endocrine: Negative.    Genitourinary: Negative for dysuria and frequency.  Musculoskeletal: Negative for arthralgias, back pain, joint swelling and neck pain.  Skin: Negative for rash.  Allergic/Immunologic: Negative.   Neurological: Negative for tremors and numbness.  Hematological: Negative for adenopathy. Does not bruise/bleed easily.  Psychiatric/Behavioral: Negative for behavioral problems and sleep disturbance. The patient is not nervous/anxious.     Vital Signs: Resp 16   Ht 5\' 4"  (1.626 m)   Wt 146 lb (66.2 kg)   BMI 25.06 kg/m    Observation/Objective:  Well sounding,    Assessment/Plan: 1. Echocardiography shows left ventricular diastolic dysfunction Will  get sleep study to eval for [facility]. - [facility]G SLEEP STUDY; Future  2. Excessive daytime sleepiness - PSG SLEEP STUDY; Future  3. Chronic obstructive pulmonary disease, unspecified COPD type ([place]) Continue with inhaler use, and use oxygen at night.   4. Cigarette nicotine dependence with nicotine-induced disorder Smoking cessation counseling: 1. Pt acknowledges the risks of long term smoking, she will try to quite smoking. 2. Options for different medications including nicotine products, chewing gum, patch etc, Wellbutrin and Chantix is discussed 3. Goal and date of compete cessation is discussed 4. Total time spent in smoking cessation is 15 min.  5. Dependence on nocturnal oxygen therapy contineu with 2 LPM at night.   6. Essential hypertension Continue current symptoms.   General Counseling: shakerra campen understanding of the findings of today's phone visit and agrees with plan of treatment. I have discussed any further diagnostic evaluation that may be needed or ordered today. We also reviewed her medications today. she has been encouraged to call the office with any questions or concerns that should arise related to todays visit.    Orders Placed This Encounter  Procedures  . PSG SLEEP STUDY    No orders of the defined  types were placed in this encounter.   Time spent: [facility] Internal medicine

## 2020-01-26 ENCOUNTER — Telehealth: Payer: Self-pay

## 2020-01-26 NOTE — Telephone Encounter (Signed)
Left a message and asked pt to call back and schedule sleep study, also mailed her reminder instructions. Sylvia Richardson

## 2020-02-07 ENCOUNTER — Telehealth: Payer: Self-pay

## 2020-02-11 NOTE — Telephone Encounter (Signed)
Patient has been advised to call once she is able to schedule sleep studies. Beth

## 2020-03-06 ENCOUNTER — Other Ambulatory Visit: Payer: Self-pay | Admitting: Adult Health

## 2020-04-30 ENCOUNTER — Other Ambulatory Visit: Payer: Self-pay | Admitting: Adult Health

## 2020-05-01 ENCOUNTER — Other Ambulatory Visit: Payer: Self-pay

## 2020-05-17 ENCOUNTER — Ambulatory Visit: Payer: Medicaid Other | Admitting: Internal Medicine

## 2020-05-17 ENCOUNTER — Encounter: Payer: Self-pay | Admitting: Internal Medicine

## 2020-05-17 VITALS — Resp 16 | Ht 64.0 in | Wt 139.0 lb

## 2020-05-17 DIAGNOSIS — Z289 Immunization not carried out for unspecified reason: Secondary | ICD-10-CM

## 2020-05-17 DIAGNOSIS — J4521 Mild intermittent asthma with (acute) exacerbation: Secondary | ICD-10-CM | POA: Diagnosis not present

## 2020-05-17 MED ORDER — AZITHROMYCIN 250 MG PO TABS: ORAL_TABLET | ORAL | 0 refills | Status: DC

## 2020-05-17 NOTE — Progress Notes (Signed)
[facility] [place], [place] [address]  Internal MEDICINE  Telephone Visit  Patient Name: Sylvia Richardson  [id number]  [id number]  Date of Service: 05/23/2020  I connected with the patient at 11 am  by telephone and verified the patients identity using two identifiers.   I discussed the limitations, risks, security and privacy concerns of performing an evaluation and management service by telephone and the availability of in person appointments. I also discussed with the patient that there may be a patient responsible charge related to the service.  The patient expressed understanding and agrees to proceed.    Chief Complaint  Patient presents with   Telephone Screen   Telephone Assessment    [id number]   Cough    coughing up yellow thick phlem, colonoscopy    Quality Metric Gaps     Covid vaccine,  flu vaccine   COPD    HPI  Pt is c/o cough and chest congestion for few days. Cough is productive and yellow in color. deneis any fever. She has not had covid vaccine yet but is thinking about it   Current Medication: Outpatient Encounter Medications as of 05/17/2020  Medication Sig   aspirin EC 81 MG tablet Take 81 mg by mouth daily.    atenolol (TENORMIN) 100 MG tablet take 1/2 to 1 tablet by mouth once daily   busPIRone (BUSPAR) 15 MG tablet Take 15 mg by mouth daily.    clonazePAM (KLONOPIN) 1 MG tablet Take 1 mg by mouth daily as needed for anxiety.    CYMBALTA 60 MG capsule Take 120 mg by mouth daily.    DALIRESP 500 MCG TABS tablet TAKE 1 TABLET BY MOUTH EVERY DAY   dicyclomine (BENTYL) 20 MG tablet Take 1 tablet (20 mg total) by mouth 3 (three) times daily as needed (abdominal pain).   esomeprazole (NEXIUM) 40 MG capsule Take 40 mg by mouth daily.    fluticasone (FLONASE) 50 MCG/ACT nasal spray Place into the nose.   HYDROcodone-acetaminophen (NORCO) 5-325 MG tablet Take 1 tablet by mouth every 6 (six) hours as needed for  moderate pain.   ipratropium-albuterol (DUONEB) 0.5-2.5 (3) MG/3ML SOLN Take 3 mLs by nebulization every 6 (six) hours as needed (shortness of breath).   metoCLOPramide (REGLAN) 10 MG tablet Take 10 mg by mouth every 6 (six) hours as needed for nausea or vomiting.    montelukast (SINGULAIR) 10 MG tablet Take 10 mg by mouth at bedtime.    ondansetron (ZOFRAN) 4 MG tablet Take 1 tablet (4 mg total) by mouth every 8 (eight) hours as needed for nausea or vomiting.   prednisoLONE acetate (PRED FORTE) 1 % ophthalmic suspension SHAKE LQ AND INT 1 GTT IN OS TID   PROVENTIL HFA 108 (90 Base) MCG/ACT inhaler INHALE 2 PUFFS INTO THE LUNGS EVERY 4 HOURS AS NEEDED FOR WHEEZING OR SHORTNESS OF BREATH   ranitidine (ZANTAC) 300 MG tablet Take 150 mg by mouth daily.    sulfamethoxazole-trimethoprim (BACTRIM) 400-80 MG tablet Take 1 tablet by mouth 2 (two) times daily.   tiotropium (SPIRIVA HANDIHALER) 18 MCG inhalation capsule Place 1 capsule (18 mcg total) into inhaler and inhale 1 day or 1 dose.   traZODone (DESYREL) 100 MG tablet Take 200 mg by mouth at bedtime as needed for sleep.    azithromycin (ZITHROMAX) 250 MG tablet Take one tab a day for 10 days for uri   No facility-administered encounter medications on file as of 05/17/2020.  Surgical History: Past Surgical History:  Procedure Laterality Date   BREAST BIOPSY Left    core bx- neg   CESAREAN SECTION     CHOLECYSTECTOMY     TONSILLECTOMY     TUBAL LIGATION      Medical History: Past Medical History:  Diagnosis Date   Bipolar disorder ([place])    Cancer ([place])    skin   Carotid artery disease (HCC)    COPD (chronic obstructive pulmonary disease) ([place])    H/O degenerative disc disease    Hypercholesteremia    Hypertension    Stroke ([place])     Family History: Family History  Problem Relation Age of Onset   Prostate cancer Father    Stroke Maternal Grandfather    Stroke Paternal Grandfather    Breast cancer  Neg Hx     Social History   Socioeconomic History   Marital status: Divorced    Spouse name: Not on file   Number of children: Not on file   Years of education: Not on file   Highest education level: Not on file  Occupational History   Not on file  Tobacco Use   Smoking status: Current Every Day Smoker   Smokeless tobacco: Never Used  Vaping Use   Vaping Use: Never used  Substance and Sexual Activity   Alcohol use: No   Drug use: No   Sexual activity: Not on file  Other Topics Concern   Not on file  Social History Narrative   Not on file   Social Determinants of Health   Financial Resource Strain:    Difficulty of Paying Living Expenses: Not on file  Food Insecurity:    Worried About Charity fundraiser in the Last Year: Not on file   [employer] of Food in the Last Year: Not on file  Transportation Needs:    Lack of Transportation (Medical): Not on file   Lack of Transportation (Non-Medical): Not on file  Physical Activity:    Days of Exercise per Week: Not on file   Minutes of Exercise per Session: Not on file  Stress:    Feeling of Stress : Not on file  Social Connections:    Frequency of Communication with Friends and Family: Not on file   Frequency of Social Gatherings with Friends and Family: Not on file   Attends Religious Services: Not on file   Active Member of Clubs or Organizations: Not on file   Attends Archivist Meetings: Not on file   Marital Status: Not on file  Intimate Partner Violence:    Fear of Current or Ex-Partner: Not on file   Emotionally Abused: Not on file   Physically Abused: Not on file   Sexually Abused: Not on file      Review of Systems  Constitutional: Negative.   HENT: Positive for rhinorrhea.   Respiratory: Positive for cough and shortness of breath.   Cardiovascular: Negative for chest pain.  Allergic/Immunologic: Positive for environmental allergies.  Neurological: Negative.      Vital Signs: Resp 16    Ht 5\' 4"  (1.626 m)    Wt 139 lb (63 kg)    BMI 23.86 kg/m    Observation/Objective: Pt seems to be congested nasally and had a wet cough. NAD    Assessment/Plan:1. Asthmatic bronchitis, mild intermittent, with acute exacerbation - she is to continue with all inhalers at home  - DG Chest 2 View; Future - azithromycin (ZITHROMAX) 250 MG tablet; Take  one tab a day for 10 days for uri  Dispense: 10 tablet; Refill: 0  2. COVID-19 virus vaccination not done - Pt is thinking about it and will get it once feels better   General Counseling: levaeh vice understanding of the findings of today's phone visit and agrees with plan of treatment. I have discussed any further diagnostic evaluation that may be needed or ordered today. We also reviewed her medications today. she has been encouraged to call the office with any questions or concerns that should arise related to todays visit.    Orders Placed This Encounter  Procedures   DG Chest 2 View    Meds ordered this encounter  Medications   azithromycin (ZITHROMAX) 250 MG tablet    Sig: Take one tab a day for 10 days for uri    Dispense:  10 tablet    Refill:  0    Time spent:20Minutes    Dr Lavera Guise Internal medicine

## 2020-06-01 ENCOUNTER — Other Ambulatory Visit: Payer: Self-pay | Admitting: Adult Health

## 2020-06-08 ENCOUNTER — Other Ambulatory Visit: Payer: Self-pay

## 2020-06-08 ENCOUNTER — Encounter: Payer: Self-pay | Admitting: Internal Medicine

## 2020-06-08 ENCOUNTER — Ambulatory Visit: Payer: Medicaid Other | Admitting: Internal Medicine

## 2020-06-08 ENCOUNTER — Ambulatory Visit
Admission: RE | Admit: 2020-06-08 | Discharge: 2020-06-08 | Disposition: A | Discharge status: Home / Self Care | Payer: Medicaid Other | Attending: Hospice and Palliative Medicine | Admitting: Hospice and Palliative Medicine

## 2020-06-08 ENCOUNTER — Ambulatory Visit
Admission: RE | Admit: 2020-06-08 | Discharge: 2020-06-08 | Disposition: A | Discharge status: Home / Self Care | Payer: Medicaid Other | Source: Ambulatory Visit | Attending: Hospice and Palliative Medicine | Admitting: Hospice and Palliative Medicine

## 2020-06-08 DIAGNOSIS — J449 Chronic obstructive pulmonary disease, unspecified: Secondary | ICD-10-CM

## 2020-06-08 DIAGNOSIS — R05 Cough: Secondary | ICD-10-CM

## 2020-06-08 DIAGNOSIS — R059 Cough, unspecified: Secondary | ICD-10-CM

## 2020-06-08 MED ORDER — PREDNISONE 10 MG PO TABS: ORAL_TABLET | ORAL | 0 refills | Status: DC

## 2020-06-08 MED ORDER — SULFAMETHOXAZOLE-TRIMETHOPRIM 400-80 MG PO TABS: 1.0000 | ORAL_TABLET | Freq: Two times a day (BID) | ORAL | 0 refills | Status: DC

## 2020-06-08 NOTE — Progress Notes (Signed)
[facility] [place], [place] [address]  Internal MEDICINE  Telephone Visit  Patient Name: Sylvia Richardson  [id number]  [id number]  Date of Service: 06/12/2020  I connected with the patient at 1231 by telephone and verified the patients identity using two identifiers.   I discussed the limitations, risks, security and privacy concerns of performing an evaluation and management service by telephone and the availability of in person appointments. I also discussed with the patient that there may be a patient responsible charge related to the service.  The patient expressed understanding and agrees to proceed.    Chief Complaint  Patient presents with   Telephone Assessment    [id number]   Telephone Screen   Sinusitis   Sore Throat   Cough    yellowishand greenish mucus    HPI Patient is being seen today for sick visit She continues to complain of sore throat, coughing, sinus pain and pressure has been ongoing for about 2 weeks was given azithromycin, made her sick on her stomach and she was unable to complete the course She was able to take two doses of Azithromycin and then had to discontinue medication due to GI upset, her respiratory symptoms have not resolved and she feels they are getting worse Afebrile    Current Medication: Outpatient Encounter Medications as of 06/08/2020  Medication Sig   aspirin EC 81 MG tablet Take 81 mg by mouth daily.    atenolol (TENORMIN) 100 MG tablet take 1/2 to 1 tablet by mouth once daily   busPIRone (BUSPAR) 15 MG tablet Take 15 mg by mouth daily.    clonazePAM (KLONOPIN) 1 MG tablet Take 1 mg by mouth daily as needed for anxiety.    CYMBALTA 60 MG capsule Take 120 mg by mouth daily.    DALIRESP 500 MCG TABS tablet TAKE 1 TABLET BY MOUTH EVERY DAY   dicyclomine (BENTYL) 20 MG tablet Take 1 tablet (20 mg total) by mouth 3 (three) times daily as needed (abdominal pain).   esomeprazole (NEXIUM) 40 MG  capsule Take 40 mg by mouth daily.    fluticasone (FLONASE) 50 MCG/ACT nasal spray Place into the nose.   HYDROcodone-acetaminophen (NORCO) 5-325 MG tablet Take 1 tablet by mouth every 6 (six) hours as needed for moderate pain.   ipratropium-albuterol (DUONEB) 0.5-2.5 (3) MG/3ML SOLN Take 3 mLs by nebulization every 6 (six) hours as needed (shortness of breath).   metoCLOPramide (REGLAN) 10 MG tablet Take 10 mg by mouth every 6 (six) hours as needed for nausea or vomiting.    montelukast (SINGULAIR) 10 MG tablet Take 10 mg by mouth at bedtime.    ondansetron (ZOFRAN) 4 MG tablet Take 1 tablet (4 mg total) by mouth every 8 (eight) hours as needed for nausea or vomiting.   prednisoLONE acetate (PRED FORTE) 1 % ophthalmic suspension SHAKE LQ AND INT 1 GTT IN OS TID   PROVENTIL HFA 108 (90 Base) MCG/ACT inhaler INHALE 2 PUFFS INTO THE LUNGS EVERY 4 HOURS AS NEEDED FOR WHEEZING OR SHORTNESS OF BREATH   ranitidine (ZANTAC) 300 MG tablet Take 150 mg by mouth daily.    tiotropium (SPIRIVA HANDIHALER) 18 MCG inhalation capsule Place 1 capsule (18 mcg total) into inhaler and inhale 1 day or 1 dose.   traZODone (DESYREL) 100 MG tablet Take 200 mg by mouth at bedtime as needed for sleep.    [DISCONTINUED] sulfamethoxazole-trimethoprim (BACTRIM) 400-80 MG tablet Take 1 tablet by mouth 2 (two) times daily.  predniSONE (DELTASONE) 10 MG tablet Take 1 tablet 3 times a day with a meal for 2 days, take 1 tablet 2 times a day for 2 days, take 1 tablet once daily for 2 days.   sulfamethoxazole-trimethoprim (BACTRIM) 400-80 MG tablet Take 1 tablet by mouth 2 (two) times daily.   [DISCONTINUED] azithromycin (ZITHROMAX) 250 MG tablet Take one tab a day for 10 days for uri   No facility-administered encounter medications on file as of 06/08/2020.    Surgical History: Past Surgical History:  Procedure Laterality Date   BREAST BIOPSY Left    core bx- neg   CESAREAN SECTION     CHOLECYSTECTOMY      TONSILLECTOMY     TUBAL LIGATION      Medical History: Past Medical History:  Diagnosis Date   Bipolar disorder ([place])    Cancer ([place])    skin   Carotid artery disease (HCC)    COPD (chronic obstructive pulmonary disease) ([place])    H/O degenerative disc disease    Hypercholesteremia    Hypertension    Stroke ([place])     Family History: Family History  Problem Relation Age of Onset   Prostate cancer Father    Stroke Maternal Grandfather    Stroke Paternal Grandfather    Breast cancer Neg Hx     Social History   Socioeconomic History   Marital status: Divorced    Spouse name: Not on file   Number of children: Not on file   Years of education: Not on file   Highest education level: Not on file  Occupational History   Not on file  Tobacco Use   Smoking status: Current Every Day Smoker   Smokeless tobacco: Never Used   Tobacco comment: 10 cigarettes a day   Vaping Use   Vaping Use: Never used  Substance and Sexual Activity   Alcohol use: No   Drug use: No   Sexual activity: Not on file  Other Topics Concern   Not on file  Social History Narrative   Not on file   Social Determinants of Health   Financial Resource Strain:    Difficulty of Paying Living Expenses: Not on file  Food Insecurity:    Worried About Running Out of Food in the Last Year: Not on file   Ran Out of Food in the Last Year: Not on file  Transportation Needs:    Lack of Transportation (Medical): Not on file   Lack of Transportation (Non-Medical): Not on file  Physical Activity:    Days of Exercise per Week: Not on file   Minutes of Exercise per Session: Not on file  Stress:    Feeling of Stress : Not on file  Social Connections:    Frequency of Communication with Friends and Family: Not on file   Frequency of Social Gatherings with Friends and Family: Not on file   Attends Religious Services: Not on file   Active Member of Clubs or Organizations: Not  on file   Attends Archivist Meetings: Not on file   Marital Status: Not on file  Intimate Partner Violence:    Fear of Current or Ex-Partner: Not on file   Emotionally Abused: Not on file   Physically Abused: Not on file   Sexually Abused: Not on file   Review of Systems  Constitutional: Positive for fatigue. Negative for chills and diaphoresis.  HENT: Positive for congestion, sinus pressure, sinus pain and sore throat. Negative for ear pain  and postnasal drip.   Eyes: Negative for photophobia, discharge, redness, itching and visual disturbance.  Respiratory: Positive for cough. Negative for shortness of breath and wheezing.   Cardiovascular: Negative for chest pain, palpitations and leg swelling.  Gastrointestinal: Negative for abdominal pain, constipation, diarrhea, nausea and vomiting.  Genitourinary: Negative for dysuria and flank pain.  Musculoskeletal: Negative for arthralgias, back pain, gait problem and neck pain.  Skin: Negative for color change.  Allergic/Immunologic: Negative for environmental allergies and food allergies.  Neurological: Negative for dizziness and headaches.  Hematological: Does not bruise/bleed easily.  Psychiatric/Behavioral: Negative for agitation, behavioral problems (depression) and hallucinations.    Vital Signs: Temp (!) 97 F (36.1 C)    Ht 5\' 4"  (1.626 m)    Wt 139 lb (63 kg)    BMI 23.86 kg/m    Observation/Objective: No acute distress noted   Assessment/Plan: 1. Chronic obstructive pulmonary disease, unspecified COPD type ([place]) Will treat for acute exacerbation of chronic pulmonary disease. Due to her multiple allergies to antibiotics will try Bactrim, encouraged to call office if she in unable to tolerate medication Will also treat with low dose prednisone taper Advised that due to her symptoms we would prefer to assess her in person and the schedule an in office visit - sulfamethoxazole-trimethoprim (BACTRIM) 400-80 MG  tablet; Take 1 tablet by mouth 2 (two) times daily.  Dispense: 14 tablet; Refill: 0 - predniSONE (DELTASONE) 10 MG tablet; Take 1 tablet 3 times a day with a meal for 2 days, take 1 tablet 2 times a day for 2 days, take 1 tablet once daily for 2 days.  Dispense: 12 tablet; Refill: 0  2. Cough Will obtain chest x-ray due to ongoing cough and other symptoms - DG Chest 2 View; Future  General Counseling: shakerra red understanding of the findings of today's phone visit and agrees with plan of treatment. I have discussed any further diagnostic evaluation that may be needed or ordered today. We also reviewed her medications today. she has been encouraged to call the office with any questions or concerns that should arise related to todays visit.    Orders Placed This Encounter  Procedures   DG Chest 2 View    Meds ordered this encounter  Medications   sulfamethoxazole-trimethoprim (BACTRIM) 400-80 MG tablet    Sig: Take 1 tablet by mouth 2 (two) times daily.    Dispense:  14 tablet    Refill:  0   predniSONE (DELTASONE) 10 MG tablet    Sig: Take 1 tablet 3 times a day with a meal for 2 days, take 1 tablet 2 times a day for 2 days, take 1 tablet once daily for 2 days.    Dispense:  12 tablet    Refill:  0     Time spent:25 Minutes   Tanna Furry. Elim Economou AGNP-C Internal medicine

## 2020-06-09 ENCOUNTER — Encounter: Payer: Self-pay | Admitting: Adult Health

## 2020-06-09 ENCOUNTER — Ambulatory Visit: Payer: Medicaid Other | Admitting: Adult Health

## 2020-06-09 VITALS — BP 105/74 | HR 85 | Temp 98.4°F | Resp 16 | Ht 64.0 in | Wt 142.2 lb

## 2020-06-09 DIAGNOSIS — F17219 Nicotine dependence, cigarettes, with unspecified nicotine-induced disorders: Secondary | ICD-10-CM | POA: Diagnosis not present

## 2020-06-09 DIAGNOSIS — J449 Chronic obstructive pulmonary disease, unspecified: Secondary | ICD-10-CM

## 2020-06-09 DIAGNOSIS — H9203 Otalgia, bilateral: Secondary | ICD-10-CM | POA: Diagnosis not present

## 2020-06-09 NOTE — Progress Notes (Signed)
[facility] [place], [place] [address]  Pulmonary Sleep Medicine   Office Visit Note  Patient Name: Sylvia Richardson DOB: [birth year] MRN [id number]  Date of Service: 06/09/2020  Complaints/HPI: PT is here for follow up on CXR from yesterday. Patient reports feeling a little better today.  No new symptoms. She does however complain that her ears feel full and she feels like this is making her feel bad.   ROS  General: (-) fever, (-) chills, (-) night sweats, (-) weakness Skin: (-) rashes, (-) itching,. Eyes: (-) visual changes, (-) redness, (-) itching. Nose and Sinuses: (-) nasal stuffiness or itchiness, (-) postnasal drip, (-) nosebleeds, (-) sinus trouble. Mouth and Throat: (-) sore throat, (-) hoarseness. Neck: (-) swollen glands, (-) enlarged thyroid, (-) neck pain. Respiratory: - cough, (-) bloody sputum, - shortness of breath, - wheezing. Cardiovascular: - ankle swelling, (-) chest pain. Lymphatic: (-) lymph node enlargement. Neurologic: (-) numbness, (-) tingling. Psychiatric: (-) anxiety, (-) depression   Current Medication: Outpatient Encounter Medications as of 06/09/2020  Medication Sig   aspirin EC 81 MG tablet Take 81 mg by mouth daily.    atenolol (TENORMIN) 100 MG tablet take 1/2 to 1 tablet by mouth once daily   busPIRone (BUSPAR) 15 MG tablet Take 15 mg by mouth daily.    clonazePAM (KLONOPIN) 1 MG tablet Take 1 mg by mouth daily as needed for anxiety.    CYMBALTA 60 MG capsule Take 120 mg by mouth daily.    DALIRESP 500 MCG TABS tablet TAKE 1 TABLET BY MOUTH EVERY DAY   dicyclomine (BENTYL) 20 MG tablet Take 1 tablet (20 mg total) by mouth 3 (three) times daily as needed (abdominal pain).   esomeprazole (NEXIUM) 40 MG capsule Take 40 mg by mouth daily.    fluticasone (FLONASE) 50 MCG/ACT nasal spray Place into the nose.   HYDROcodone-acetaminophen (NORCO) 5-325 MG tablet Take 1 tablet by mouth every 6 (six) hours as  needed for moderate pain.   ipratropium-albuterol (DUONEB) 0.5-2.5 (3) MG/3ML SOLN Take 3 mLs by nebulization every 6 (six) hours as needed (shortness of breath).   metoCLOPramide (REGLAN) 10 MG tablet Take 10 mg by mouth every 6 (six) hours as needed for nausea or vomiting.    montelukast (SINGULAIR) 10 MG tablet Take 10 mg by mouth at bedtime.    ondansetron (ZOFRAN) 4 MG tablet Take 1 tablet (4 mg total) by mouth every 8 (eight) hours as needed for nausea or vomiting.   prednisoLONE acetate (PRED FORTE) 1 % ophthalmic suspension SHAKE LQ AND INT 1 GTT IN OS TID   predniSONE (DELTASONE) 10 MG tablet Take 1 tablet 3 times a day with a meal for 2 days, take 1 tablet 2 times a day for 2 days, take 1 tablet once daily for 2 days.   PROVENTIL HFA 108 (90 Base) MCG/ACT inhaler INHALE 2 PUFFS INTO THE LUNGS EVERY 4 HOURS AS NEEDED FOR WHEEZING OR SHORTNESS OF BREATH   ranitidine (ZANTAC) 300 MG tablet Take 150 mg by mouth daily.    sulfamethoxazole-trimethoprim (BACTRIM) 400-80 MG tablet Take 1 tablet by mouth 2 (two) times daily.   tiotropium (SPIRIVA HANDIHALER) 18 MCG inhalation capsule Place 1 capsule (18 mcg total) into inhaler and inhale 1 day or 1 dose.   traZODone (DESYREL) 100 MG tablet Take 200 mg by mouth at bedtime as needed for sleep.    No facility-administered encounter medications on file as of 06/09/2020.    Surgical  History: Past Surgical History:  Procedure Laterality Date   BREAST BIOPSY Left    core bx- neg   CESAREAN SECTION     CHOLECYSTECTOMY     TONSILLECTOMY     TUBAL LIGATION      Medical History: Past Medical History:  Diagnosis Date   Bipolar disorder ([place])    Cancer ([place])    skin   Carotid artery disease (HCC)    COPD (chronic obstructive pulmonary disease) ([place])    H/O degenerative disc disease    Hypercholesteremia    Hypertension    Stroke ([place])     Family History: Family History  Problem Relation Age of Onset   Prostate  cancer Father    Stroke Maternal Grandfather    Stroke Paternal Grandfather    Breast cancer Neg Hx     Social History: Social History   Socioeconomic History   Marital status: Divorced    Spouse name: Not on file   Number of children: Not on file   Years of education: Not on file   Highest education level: Not on file  Occupational History   Not on file  Tobacco Use   Smoking status: Current Every Day Smoker   Smokeless tobacco: Never Used   Tobacco comment: 10 cigarettes a day   Vaping Use   Vaping Use: Never used  Substance and Sexual Activity   Alcohol use: No   Drug use: No   Sexual activity: Not on file  Other Topics Concern   Not on file  Social History Narrative   Not on file   Social Determinants of Health   Financial Resource Strain:    Difficulty of Paying Living Expenses: Not on file  Food Insecurity:    Worried About Charity fundraiser in the Last Year: Not on file   [employer] of Food in the Last Year: Not on file  Transportation Needs:    Lack of Transportation (Medical): Not on file   Lack of Transportation (Non-Medical): Not on file  Physical Activity:    Days of Exercise per Week: Not on file   Minutes of Exercise per Session: Not on file  Stress:    Feeling of Stress : Not on file  Social Connections:    Frequency of Communication with Friends and Family: Not on file   Frequency of Social Gatherings with Friends and Family: Not on file   Attends Religious Services: Not on file   Active Member of Clubs or Organizations: Not on file   Attends Archivist Meetings: Not on file   Marital Status: Not on file  Intimate Partner Violence:    Fear of Current or Ex-Partner: Not on file   Emotionally Abused: Not on file   Physically Abused: Not on file   Sexually Abused: Not on file    Vital Signs: Blood pressure 105/74, pulse 85, temperature 98.4 F (36.9 C), resp. rate 16, height 5\' 4"  (1.626 m), weight  142 lb 3.2 oz (64.5 kg), SpO2 94 %.  Examination: General Appearance: The patient is well-developed, well-nourished, and in no distress. Skin: Gross inspection of skin unremarkable. Head: normocephalic, no gross deformities. Eyes: no gross deformities noted. ENT: ears appear grossly normal no exudates. Neck: Supple. No thyromegaly. No LAD. Respiratory: clear bilaterally. Cardiovascular: Normal S1 and S2 without murmur or rub. Extremities: No cyanosis. pulses are equal. Neurologic: Alert and oriented. No involuntary movements.  LABS: No results found for this or any previous visit (from the past  2160 hour(s)).  Radiology: DG Chest 2 View  Result Date: 06/09/2020 CLINICAL DATA:  Chest pain with cough and shortness of breath EXAM: CHEST - 2 VIEW COMPARISON:  November 09, 2009 FINDINGS: There is a nodular opacity either in or overlying the medial left apex measuring 9 x 8 mm. This opacity appear stable compared to prior study from November 2018. The lungs elsewhere are clear. The heart size and pulmonary vascularity are normal. No adenopathy. There are surgical clips in the upper left and right abdomen regions. There is mild degenerative change in the thoracic spine. IMPRESSION: 9 x 8 mm opacity either in or overlying the medial left apex, potentially arising with in the posterior left third rib. This opacity is stable compared to November 2018 study. Stability over this time span is highly suggestive of benign etiology. No new opacity evident. Cardiac silhouette within normal limits. Electronically Signed   By: Lowella Grip III M.D.   On: 06/09/2020 08:23    DG Chest 2 View  Result Date: 06/09/2020 CLINICAL DATA:  Chest pain with cough and shortness of breath EXAM: CHEST - 2 VIEW COMPARISON:  November 09, 2009 FINDINGS: There is a nodular opacity either in or overlying the medial left apex measuring 9 x 8 mm. This opacity appear stable compared to prior study from November 2018. The lungs  elsewhere are clear. The heart size and pulmonary vascularity are normal. No adenopathy. There are surgical clips in the upper left and right abdomen regions. There is mild degenerative change in the thoracic spine. IMPRESSION: 9 x 8 mm opacity either in or overlying the medial left apex, potentially arising with in the posterior left third rib. This opacity is stable compared to November 2018 study. Stability over this time span is highly suggestive of benign etiology. No new opacity evident. Cardiac silhouette within normal limits. Electronically Signed   By: Lowella Grip III M.D.   On: 06/09/2020 08:23    DG Chest 2 View  Result Date: 06/09/2020 CLINICAL DATA:  Chest pain with cough and shortness of breath EXAM: CHEST - 2 VIEW COMPARISON:  November 09, 2009 FINDINGS: There is a nodular opacity either in or overlying the medial left apex measuring 9 x 8 mm. This opacity appear stable compared to prior study from November 2018. The lungs elsewhere are clear. The heart size and pulmonary vascularity are normal. No adenopathy. There are surgical clips in the upper left and right abdomen regions. There is mild degenerative change in the thoracic spine. IMPRESSION: 9 x 8 mm opacity either in or overlying the medial left apex, potentially arising with in the posterior left third rib. This opacity is stable compared to November 2018 study. Stability over this time span is highly suggestive of benign etiology. No new opacity evident. Cardiac silhouette within normal limits. Electronically Signed   By: Lowella Grip III M.D.   On: 06/09/2020 08:23      Assessment and Plan: Patient Active Problem List   Diagnosis Date Noted   PAD (peripheral artery disease) ([place]) 04/22/2018   Bilateral carotid artery stenosis 02/14/2017   Essential hypertension 02/14/2017    1. Ear pain, bilateral Continue antibiotics as prescribed.  Large amount of cerumen removed from ears.  - EAR CERUMEN REMOVAL  2.  Chronic obstructive pulmonary disease, unspecified COPD type ([place]) Stable, continue inhalers as prescribed.   3. Cigarette nicotine dependence with nicotine-induced disorder Smoking cessation counseling: 1. Pt acknowledges the risks of long term smoking, she will try to quite  smoking. 2. Options for different medications including nicotine products, chewing gum, patch etc, Wellbutrin and Chantix is discussed 3. Goal and date of compete cessation is discussed 4. Total time spent in smoking cessation is 15 min.   General Counseling: I have discussed the findings of the evaluation and examination with Blanch Media.  I have also discussed any further diagnostic evaluation thatmay be needed or ordered today. Pattie verbalizes understanding of the findings of todays visit. We also reviewed her medications today and discussed drug interactions and side effects including but not limited excessive drowsiness and altered mental states. We also discussed that there is always a risk not just to her but also people around her. she has been encouraged to call the office with any questions or concerns that should arise related to todays visit.  No orders of the defined types were placed in this encounter.    Time spent:  25  I have personally obtained a history, examined the patient, evaluated laboratory and imaging results, formulated the assessment and plan and placed orders.    Allyne Gee, MD [facility] Pulmonary and Critical Care Sleep medicine

## 2020-06-12 ENCOUNTER — Telehealth: Payer: Self-pay

## 2020-06-12 ENCOUNTER — Encounter: Payer: Self-pay | Admitting: Internal Medicine

## 2020-06-12 NOTE — Telephone Encounter (Signed)
Spoke with phar about bactrim to canceled pharmacist said she spoke with pt she can tolerate bactrim well and also advised pt that take with food

## 2020-06-13 ENCOUNTER — Ambulatory Visit: Payer: Medicaid Other | Admitting: Internal Medicine

## 2020-07-04 ENCOUNTER — Ambulatory Visit: Payer: Medicaid Other | Admitting: Internal Medicine

## 2020-07-05 ENCOUNTER — Telehealth: Payer: Self-pay

## 2020-07-05 NOTE — Telephone Encounter (Signed)
Unable to reach pt to rescdh missed appt with DSK. Sylvia Richardson

## 2020-09-04 ENCOUNTER — Telehealth: Payer: Self-pay

## 2020-09-04 NOTE — Telephone Encounter (Signed)
Lmom for patient to schedule an in office pulm visit for a CMN order for Oxygen. Needs to have an ov before signing please schedule if patient calls back. Sylvia Richardson

## 2020-09-10 ENCOUNTER — Other Ambulatory Visit: Payer: Self-pay | Admitting: Internal Medicine

## 2020-09-12 ENCOUNTER — Other Ambulatory Visit: Payer: Self-pay

## 2020-09-18 ENCOUNTER — Telehealth: Payer: Self-pay

## 2020-09-18 NOTE — Telephone Encounter (Signed)
Daliresp 500 MCG Tabs approved, PA# [id number]

## 2020-10-26 ENCOUNTER — Ambulatory Visit: Payer: Medicaid Other | Admitting: Internal Medicine

## 2020-11-02 DIAGNOSIS — B001 Herpesviral vesicular dermatitis: Secondary | ICD-10-CM | POA: Insufficient documentation

## 2020-12-03 ENCOUNTER — Other Ambulatory Visit: Payer: Self-pay | Admitting: Internal Medicine

## 2020-12-03 NOTE — Telephone Encounter (Signed)
Refill with 3, needs follow up in July

## 2020-12-11 NOTE — Telephone Encounter (Signed)
Spoke with pt and gave courtney for appt

## 2020-12-13 DIAGNOSIS — F411 Generalized anxiety disorder: Secondary | ICD-10-CM | POA: Insufficient documentation

## 2021-01-16 ENCOUNTER — Telehealth: Payer: Self-pay

## 2021-01-16 NOTE — Telephone Encounter (Signed)
Diane from [facility] [phone]) called asking about faxing a CMN to [place]. I informed her that she will need to mail [place] the CMN or bring to our office that we do not accept them thru fax.

## 2021-01-27 ENCOUNTER — Emergency Department: Payer: Medicaid Other

## 2021-01-27 ENCOUNTER — Emergency Department
Admission: EM | Admit: 2021-01-27 | Discharge: 2021-01-27 | Disposition: A | Discharge status: Home / Self Care | Payer: Medicaid Other | Attending: Emergency Medicine | Admitting: Emergency Medicine

## 2021-01-27 ENCOUNTER — Encounter: Payer: Self-pay | Admitting: Emergency Medicine

## 2021-01-27 ENCOUNTER — Other Ambulatory Visit: Payer: Self-pay

## 2021-01-27 DIAGNOSIS — U071 COVID-19: Secondary | ICD-10-CM | POA: Diagnosis not present

## 2021-01-27 DIAGNOSIS — J449 Chronic obstructive pulmonary disease, unspecified: Secondary | ICD-10-CM | POA: Insufficient documentation

## 2021-01-27 DIAGNOSIS — Z28310 Unvaccinated for covid-19: Secondary | ICD-10-CM | POA: Insufficient documentation

## 2021-01-27 DIAGNOSIS — Z79899 Other long term (current) drug therapy: Secondary | ICD-10-CM | POA: Diagnosis not present

## 2021-01-27 DIAGNOSIS — F17200 Nicotine dependence, unspecified, uncomplicated: Secondary | ICD-10-CM | POA: Diagnosis not present

## 2021-01-27 DIAGNOSIS — Z85828 Personal history of other malignant neoplasm of skin: Secondary | ICD-10-CM | POA: Diagnosis not present

## 2021-01-27 DIAGNOSIS — I10 Essential (primary) hypertension: Secondary | ICD-10-CM | POA: Diagnosis not present

## 2021-01-27 DIAGNOSIS — Z7982 Long term (current) use of aspirin: Secondary | ICD-10-CM | POA: Diagnosis not present

## 2021-01-27 DIAGNOSIS — I2510 Atherosclerotic heart disease of native coronary artery without angina pectoris: Secondary | ICD-10-CM | POA: Diagnosis not present

## 2021-01-27 DIAGNOSIS — R112 Nausea with vomiting, unspecified: Secondary | ICD-10-CM | POA: Diagnosis present

## 2021-01-27 LAB — COMPREHENSIVE METABOLIC PANEL
ALT: 21 U/L (ref 0–44)
AST: 20 U/L (ref 15–41)
Albumin: 3.8 g/dL (ref 3.5–5.0)
Alkaline Phosphatase: 48 U/L (ref 38–126)
Anion gap: 7 (ref 5–15)
BUN: 8 mg/dL (ref 8–23)
CO2: 26 mmol/L (ref 22–32)
Calcium: 9.4 mg/dL (ref 8.9–10.3)
Chloride: 94 mmol/L — ABNORMAL LOW (ref 98–111)
Creatinine, Ser: 0.46 mg/dL (ref 0.44–1.00)
GFR, Estimated: >60 mL/min (ref >60)
Glucose, Bld: 92 mg/dL (ref 70–99)
Potassium: 4.3 mmol/L (ref 3.5–5.1)
Sodium: 127 mmol/L — ABNORMAL LOW (ref 135–145)
Total Bilirubin: 0.5 mg/dL (ref 0.3–1.2)
Total Protein: 7 g/dL (ref 6.5–8.1)

## 2021-01-27 LAB — CBC
HCT: 45.8 % (ref 36.0–46.0)
Hemoglobin: 16.1 g/dL — ABNORMAL HIGH (ref 12.0–15.0)
MCH: 30.1 pg (ref 26.0–34.0)
MCHC: 35.2 g/dL (ref 30.0–36.0)
MCV: 85.6 fL (ref 80.0–100.0)
Platelets: 142 10*3/uL — ABNORMAL LOW (ref 150–400)
RBC: 5.35 MIL/uL — ABNORMAL HIGH (ref 3.87–5.11)
RDW: 12.1 % (ref 11.5–15.5)
WBC: 4.3 10*3/uL (ref 4.0–10.5)
nRBC: 0 % (ref 0.0–0.2)

## 2021-01-27 MED ORDER — DEXAMETHASONE SODIUM PHOSPHATE 10 MG/ML IJ SOLN
8.0000 mg | Freq: Once | INTRAMUSCULAR | Status: AC
  Administered 2021-01-27: 8 mg via INTRAVENOUS
  Filled 2021-01-27: qty 1

## 2021-01-27 MED ORDER — FAMOTIDINE IN NACL 20-0.9 MG/50ML-% IV SOLN: 20.0000 mg | Freq: Once | INTRAVENOUS | Status: DC | PRN

## 2021-01-27 MED ORDER — SODIUM CHLORIDE 0.9 % IV SOLN: INTRAVENOUS | Status: DC | PRN

## 2021-01-27 MED ORDER — SODIUM CHLORIDE 0.9 % IV SOLN
1000.0000 mL | Freq: Once | INTRAVENOUS | Status: AC
  Administered 2021-01-27: 1000 mL via INTRAVENOUS

## 2021-01-27 MED ORDER — EPINEPHRINE 0.3 MG/0.3ML IJ SOAJ: 0.3000 mg | Freq: Once | INTRAMUSCULAR | Status: DC | PRN

## 2021-01-27 MED ORDER — DIPHENHYDRAMINE HCL 50 MG/ML IJ SOLN: 50.0000 mg | Freq: Once | INTRAMUSCULAR | Status: DC | PRN

## 2021-01-27 MED ORDER — BEBTELOVIMAB 175 MG/2 ML IV (EUA)
175.0000 mg | Freq: Once | INTRAMUSCULAR | Status: AC
  Administered 2021-01-27: 175 mg via INTRAVENOUS
  Filled 2021-01-27 (×2): qty 2

## 2021-01-27 MED ORDER — METHYLPREDNISOLONE SODIUM SUCC 125 MG IJ SOLR: 125.0000 mg | Freq: Once | INTRAMUSCULAR | Status: DC | PRN

## 2021-01-27 MED ORDER — ONDANSETRON 4 MG PO TBDP: 4.0000 mg | ORAL_TABLET | Freq: Three times a day (TID) | ORAL | 0 refills | Status: DC | PRN

## 2021-01-27 MED ORDER — ONDANSETRON HCL 4 MG/2ML IJ SOLN
4.0000 mg | Freq: Once | INTRAMUSCULAR | Status: AC
  Administered 2021-01-27: 4 mg via INTRAVENOUS
  Filled 2021-01-27: qty 2

## 2021-01-27 MED ORDER — ALBUTEROL SULFATE HFA 108 (90 BASE) MCG/ACT IN AERS
2.0000 | INHALATION_SPRAY | Freq: Once | RESPIRATORY_TRACT | Status: DC | PRN
  Filled 2021-01-27: qty 6.7

## 2021-01-27 NOTE — ED Provider Notes (Signed)
[facility] Emergency Department Provider Note   ____________________________________________    I have reviewed the triage vital signs and the nursing notes.   HISTORY  Chief Complaint Emesis, Headache, and Diarrhea     HPI Sylvia Richardson is a 65 y.o. female who presents with nausea vomiting headache diarrhea, fatigue.  Patient reports she tested positive for COVID-19 on Saturday 7 days ago, she started feeling ill the day prior.  She does have a history of COPD.  She is not vaccinated.  She has taken over-the-counter medications with little relief.  She reports cough but denies significant shortness of breath.  Positive fevers.  Has not had any monoclonal antibody treatment or Paxil over the  Past Medical History:  Diagnosis Date  . Bipolar disorder ([place])   . Cancer ([place])    skin  . Carotid artery disease ([place])   . COPD (chronic obstructive pulmonary disease) ([place])   . H/O degenerative disc disease   . Hypercholesteremia   . Hypertension   . Stroke [facility])     Patient Active Problem List   Diagnosis Date Noted  . PAD (peripheral artery disease) ([place]) 04/22/2018  . Bilateral carotid artery stenosis 02/14/2017  . Essential hypertension 02/14/2017    Past Surgical History:  Procedure Laterality Date  . BREAST BIOPSY Left    core bx- neg  . CESAREAN SECTION    . CHOLECYSTECTOMY    . TONSILLECTOMY    . TUBAL LIGATION      Prior to Admission medications   Medication Sig Start Date End Date Taking? Authorizing Provider  ondansetron (ZOFRAN ODT) 4 MG disintegrating tablet Take 1 tablet (4 mg total) by mouth every 8 (eight) hours as needed. 01/27/21  Yes Lavonia Drafts, MD  aspirin EC 81 MG tablet Take 81 mg by mouth daily.  10/02/10   [provider]  atenolol (TENORMIN) 100 MG tablet take 1/2 to 1 tablet by mouth once daily 01/17/17   [provider]  busPIRone (BUSPAR) 15 MG tablet Take 15 mg by mouth daily.  01/06/17    [provider]  clonazePAM (KLONOPIN) 1 MG tablet Take 1 mg by mouth daily as needed for anxiety.     [provider]  CYMBALTA 60 MG capsule Take 120 mg by mouth daily.     [provider]  DALIRESP 500 MCG TABS tablet TAKE 1 TABLET BY MOUTH EVERY DAY 12/03/20   Lavera Guise, MD  dicyclomine (BENTYL) 20 MG tablet Take 1 tablet (20 mg total) by mouth 3 (three) times daily as needed (abdominal pain). 11/12/17   Nance Pear, MD  esomeprazole (NEXIUM) 40 MG capsule Take 40 mg by mouth daily.  10/31/16   [provider]  fluticasone (FLONASE) 50 MCG/ACT nasal spray Place into the nose. 12/17/11   [provider]  HYDROcodone-acetaminophen (NORCO) 5-325 MG tablet Take 1 tablet by mouth every 6 (six) hours as needed for moderate pain. 08/26/15   Daymon Larsen, MD  ipratropium-albuterol (DUONEB) 0.5-2.5 (3) MG/3ML SOLN Take 3 mLs by nebulization every 6 (six) hours as needed (shortness of breath). 11/24/18   Kendell Bane, NP  metoCLOPramide (REGLAN) 10 MG tablet Take 10 mg by mouth every 6 (six) hours as needed for nausea or vomiting.     [provider]  montelukast (SINGULAIR) 10 MG tablet Take 10 mg by mouth at bedtime.  02/26/14   [provider]  ondansetron (ZOFRAN) 4 MG tablet Take 1 tablet (  4 mg total) by mouth every 8 (eight) hours as needed for nausea or vomiting. 11/12/17   Nance Pear, MD  prednisoLONE acetate (PRED FORTE) 1 % ophthalmic suspension SHAKE LQ AND INT 1 GTT IN OS TID 04/16/18   [provider]  predniSONE (DELTASONE) 10 MG tablet Take 1 tablet 3 times a day with a meal for 2 days, take 1 tablet 2 times a day for 2 days, take 1 tablet once daily for 2 days. 06/08/20   Luiz Ochoa, NP  PROVENTIL HFA 108 (90 Base) MCG/ACT inhaler INHALE 2 PUFFS INTO THE LUNGS EVERY 4 HOURS AS NEEDED FOR WHEEZING OR SHORTNESS OF BREATH 05/01/20   Allyne Gee, MD  ranitidine (ZANTAC) 300 MG tablet Take 150 mg by mouth  daily.  12/27/16   [provider]  sulfamethoxazole-trimethoprim (BACTRIM) 400-80 MG tablet Take 1 tablet by mouth 2 (two) times daily. 06/08/20   Luiz Ochoa, NP  tiotropium (SPIRIVA HANDIHALER) 18 MCG inhalation capsule Place 1 capsule (18 mcg total) into inhaler and inhale 1 day or 1 dose. 11/11/19   Kendell Bane, NP  traZODone (DESYREL) 100 MG tablet Take 200 mg by mouth at bedtime as needed for sleep.  10/20/14   [provider]     Allergies Bactrim [sulfamethoxazole-trimethoprim], Ciprofloxacin, Clindamycin/lincomycin, Choline fenofibrate, Niacin, Nifedipine, Rosuvastatin, Zithromax [azithromycin], Doxycycline, Fish oil, Levaquin [levofloxacin], and Penicillins  Family History  Problem Relation Age of Onset  . Prostate cancer Father   . Stroke Maternal Grandfather   . Stroke Paternal Grandfather   . Breast cancer Neg Hx     Social History Social History   Tobacco Use  . Smoking status: Current Every Day Smoker  . Smokeless tobacco: Never Used  . Tobacco comment: 10 cigarettes a day   Vaping Use  . Vaping Use: Never used  Substance Use Topics  . Alcohol use: No  . Drug use: No    Review of Systems  Constitutional: As above Eyes: No visual changes.  ENT: No sore throat. Cardiovascular: Denies chest pain. Respiratory: As above Gastrointestinal: No abdominal pain.  Nausea, diarrhea Genitourinary: Negative for dysuria. Musculoskeletal: Myalgias Skin: Negative for rash. Neurological: Negative for headaches   ____________________________________________   PHYSICAL EXAM:  VITAL SIGNS: ED Triage Vitals  Enc Vitals Group     BP 01/27/21 0716 (!) 156/54     Pulse Rate 01/27/21 0716 74     Resp 01/27/21 0716 20     Temp 01/27/21 0716 98.8 F (37.1 C)     Temp Source 01/27/21 0716 Oral     SpO2 01/27/21 0716 96 %     Weight 01/27/21 0713 58.1 kg (128 lb)     Height 01/27/21 0713 1.626 m (5\' 4" )     Head Circumference --      Peak Flow --       Pain Score 01/27/21 0713 8     Pain Loc --      Pain Edu? --      Excl. in [place]? --     Constitutional: Alert and oriented.  Nose: No congestion/rhinnorhea. Mouth/Throat: Mucous membranes are moist.   Neck:  Painless ROM Cardiovascular: Normal rate, regular rhythm. Grossly normal heart sounds.  Good peripheral circulation. Respiratory: Normal respiratory effort.  No retractions.  Scattered wheezing Gastrointestinal: Soft and nontender. No distention.  No CVA tenderness.  Musculoskeletal: No lower extremity tenderness nor edema.  Warm and well perfused Neurologic:  Normal speech and language. No gross focal  neurologic deficits are appreciated.  Skin:  Skin is warm, dry and intact. No rash noted. Psychiatric: Mood and affect are normal. Speech and behavior are normal.  ____________________________________________   LABS (all labs ordered are listed, but only abnormal results are displayed)  Labs Reviewed  CBC - Abnormal; Notable for the following components:      Result Value   RBC 5.35 (*)    Hemoglobin 16.1 (*)    Platelets 142 (*)    All other components within normal limits  COMPREHENSIVE METABOLIC PANEL - Abnormal; Notable for the following components:   Sodium 127 (*)    Chloride 94 (*)    All other components within normal limits   ____________________________________________  EKG  ED ECG REPORT I, Lavonia Drafts, the attending physician, personally viewed and interpreted this ECG.  Date: 01/27/2021  Rhythm: normal sinus rhythm QRS Axis: normal Intervals: normal ST/T Wave abnormalities: J-point elevation Narrative Interpretation: No chest pain, not consistent with MI  ____________________________________________  RADIOLOGY  Chest x-ray reviewed by me ____________________________________________   PROCEDURES  Procedure(s) performed: No  Procedures   Critical Care performed: No ____________________________________________   INITIAL  IMPRESSION / ASSESSMENT AND PLAN / ED COURSE  Pertinent labs & imaging results that were available during my care of the patient were reviewed by me and considered in my medical decision making (see chart for details).  Patient presents with known COVID-19, she is on day 7 of illness, she is unvaccinated.  Her symptoms are consistent with COVID-19 infection.  She has no chest pain.  No significant shortness of breath, mild cough.  She is at elevated risk given her unvaccinated status, age, COPD  Will obtain labs, give IV fluids, IV Zofran obtain chest x-ray give a dose of Decadron and consider MAB given she is outside of the window for PAxlovid  Lab work is overall reassuring, mild hyponatremia, chest x-ray is reassuring  Will treat with monoclonal antibody infusion here in the emergency department, appropriate for discharge with symptomatic    ____________________________________________   FINAL CLINICAL IMPRESSION(S) / ED DIAGNOSES  Final diagnoses:  COVID-19        Note:  This document was prepared using Dragon voice recognition software and may include unintentional dictation errors.   Lavonia Drafts, MD 01/27/21 1003

## 2021-01-27 NOTE — ED Notes (Signed)
Pt signed consent for antibody infusion.

## 2021-01-27 NOTE — ED Triage Notes (Signed)
Pt via EMS from home. Pt tested positive for COVID on Saturday 5/7. Pt c/o headahce, NVD, and fatigue. Pt is A&Ox4 and NAD.

## 2021-02-02 ENCOUNTER — Telehealth: Payer: Self-pay

## 2021-02-02 NOTE — Telephone Encounter (Signed)
Pt called and advised she has had covid now for 2 weeks and is now having problems with her COPD and is hard to breathe.  I advised that pt needs to go back to the ER or go to a urgent care since we are closed at time she called the answering service line.  I also informed pt that she has missed and no showed for the last 3 appointments she was scheduled for. And has an upcoming appt 03/27/21.

## 2021-02-28 ENCOUNTER — Other Ambulatory Visit: Payer: Self-pay | Admitting: Internal Medicine

## 2021-02-28 ENCOUNTER — Telehealth: Payer: Self-pay

## 2021-02-28 NOTE — Telephone Encounter (Signed)
Pt advised that refills daliresp as per dfk  ok to send advised keep follow in 8/22

## 2021-03-12 ENCOUNTER — Other Ambulatory Visit (INDEPENDENT_AMBULATORY_CARE_PROVIDER_SITE_OTHER): Payer: Self-pay | Admitting: Vascular Surgery

## 2021-03-12 DIAGNOSIS — I6523 Occlusion and stenosis of bilateral carotid arteries: Secondary | ICD-10-CM

## 2021-03-13 ENCOUNTER — Ambulatory Visit (INDEPENDENT_AMBULATORY_CARE_PROVIDER_SITE_OTHER): Payer: Medicaid Other | Admitting: Vascular Surgery

## 2021-03-13 ENCOUNTER — Encounter (INDEPENDENT_AMBULATORY_CARE_PROVIDER_SITE_OTHER): Payer: Self-pay | Admitting: Vascular Surgery

## 2021-03-13 ENCOUNTER — Other Ambulatory Visit: Payer: Self-pay

## 2021-03-13 ENCOUNTER — Ambulatory Visit (INDEPENDENT_AMBULATORY_CARE_PROVIDER_SITE_OTHER): Payer: Medicaid Other

## 2021-03-13 VITALS — BP 197/78 | HR 83 | Resp 16 | Wt 133.8 lb

## 2021-03-13 DIAGNOSIS — I10 Essential (primary) hypertension: Secondary | ICD-10-CM | POA: Diagnosis not present

## 2021-03-13 DIAGNOSIS — Z8673 Personal history of transient ischemic attack (TIA), and cerebral infarction without residual deficits: Secondary | ICD-10-CM

## 2021-03-13 DIAGNOSIS — I6523 Occlusion and stenosis of bilateral carotid arteries: Secondary | ICD-10-CM | POA: Diagnosis not present

## 2021-03-13 DIAGNOSIS — J449 Chronic obstructive pulmonary disease, unspecified: Secondary | ICD-10-CM | POA: Diagnosis not present

## 2021-03-13 DIAGNOSIS — Z85828 Personal history of other malignant neoplasm of skin: Secondary | ICD-10-CM | POA: Insufficient documentation

## 2021-03-13 NOTE — Progress Notes (Signed)
Patient ID: Sylvia Richardson, female   DOB: [birth year], 65 y.o.   MRN: [id number]  Chief Complaint  Patient presents with   Follow-up    Ref [place] dizziness    HPI Sylvia Richardson is a 65 y.o. female.  I am asked to see the patient by Dr. Pryor Ochoa for evaluation of carotid stenosis.  The patient reports significant dizziness, neck pain, and left ear pain.  She has actually been seen in our office and was most recently seen just under 3 years ago with a carotid duplex.  She says she has done more poorly since that time.  No stroke symptoms recently although she has a remote history of stroke.  No arm or leg weakness or numbness, speech or swallowing difficulty, or temporary monocular blindness.  We performed a carotid duplex today which demonstrates 40 to 59% ICA stenosis bilaterally which is similar to her study from 2019.   Past Medical History:  Diagnosis Date   Bipolar disorder ([place])    Cancer ([place])    skin   Carotid artery disease ([facility])    COPD (chronic obstructive pulmonary disease) ([place])    H/O degenerative disc disease    Hypercholesteremia    Hypertension    Stroke [facility])     Past Surgical History:  Procedure Laterality Date   BREAST BIOPSY Left    core bx- neg   CESAREAN SECTION     CHOLECYSTECTOMY     TONSILLECTOMY     TUBAL LIGATION      Family History  Problem Relation Age of Onset   Prostate cancer Father    Stroke Maternal Grandfather    Stroke Paternal Grandfather    Breast cancer Neg Hx       Social History   Tobacco Use   Smoking status: Every Day    Pack years: 0.00   Smokeless tobacco: Never   Tobacco comments:    10 cigarettes a day   Vaping Use   Vaping Use: Never used  Substance Use Topics   Alcohol use: No   Drug use: No     Allergies  Allergen Reactions   Bactrim [Sulfamethoxazole-Trimethoprim] Diarrhea   Ciprofloxacin Anaphylaxis   Clindamycin/Lincomycin Nausea And Vomiting   Zithromax [Azithromycin]      Abdomen pain and acid reflux    Choline Fenofibrate     Other reaction(s): Other (See Comments) Other Reaction: LEFT SIDE TONGUE SWELLING Other reaction(s): Other (See Comments) LEFT SIDE TONGUE SWELLING Other Reaction: LEFT SIDE TONGUE SWELLING   Doxycycline Nausea Only   Fish Oil Nausea Only   Levaquin [Levofloxacin] Rash   Niacin Diarrhea   Nifedipine Diarrhea   Penicillins Rash   Rosuvastatin     Other reaction(s): Headache    Current Outpatient Medications  Medication Sig Dispense Refill   aspirin EC 81 MG tablet Take 81 mg by mouth daily.      atenolol (TENORMIN) 100 MG tablet take 1/2 to 1 tablet by mouth once daily  0   busPIRone (BUSPAR) 15 MG tablet Take 15 mg by mouth daily.   0   clonazePAM (KLONOPIN) 1 MG tablet Take 1 mg by mouth daily as needed for anxiety.      CYMBALTA 60 MG capsule Take 120 mg by mouth daily.   0   DALIRESP 500 MCG TABS tablet TAKE 1 TABLET BY MOUTH EVERY DAY(PLEASE KEEP APPT. IN JULY) 30 tablet 3   dicyclomine (BENTYL) 20 MG tablet Take 1 tablet (20 mg total)  by mouth 3 (three) times daily as needed (abdominal pain). 30 tablet 0   esomeprazole (NEXIUM) 40 MG capsule Take 40 mg by mouth daily.      fluticasone (FLONASE) 50 MCG/ACT nasal spray Place into the nose.     ipratropium-albuterol (DUONEB) 0.5-2.5 (3) MG/3ML SOLN Take 3 mLs by nebulization every 6 (six) hours as needed (shortness of breath). 360 mL 0   metoCLOPramide (REGLAN) 10 MG tablet Take 10 mg by mouth every 6 (six) hours as needed for nausea or vomiting.      montelukast (SINGULAIR) 10 MG tablet Take 10 mg by mouth at bedtime.      ondansetron (ZOFRAN ODT) 4 MG disintegrating tablet Take 1 tablet (4 mg total) by mouth every 8 (eight) hours as needed. 20 tablet 0   ondansetron (ZOFRAN) 4 MG tablet Take 1 tablet (4 mg total) by mouth every 8 (eight) hours as needed for nausea or vomiting. 20 tablet 0   PROVENTIL HFA 108 (90 Base) MCG/ACT inhaler INHALE 2 PUFFS INTO THE LUNGS EVERY 4  HOURS AS NEEDED FOR WHEEZING OR SHORTNESS OF BREATH 6.7 g 1   traZODone (DESYREL) 100 MG tablet Take 200 mg by mouth at bedtime as needed for sleep.      HYDROcodone-acetaminophen (NORCO) 5-325 MG tablet Take 1 tablet by mouth every 6 (six) hours as needed for moderate pain. (Patient not taking: Reported on 03/13/2021) 20 tablet 0   prednisoLONE acetate (PRED FORTE) 1 % ophthalmic suspension SHAKE LQ AND INT 1 GTT IN OS TID (Patient not taking: Reported on 03/13/2021)  0   predniSONE (DELTASONE) 10 MG tablet Take 1 tablet 3 times a day with a meal for 2 days, take 1 tablet 2 times a day for 2 days, take 1 tablet once daily for 2 days. (Patient not taking: Reported on 03/13/2021) 12 tablet 0   ranitidine (ZANTAC) 300 MG tablet Take 150 mg by mouth daily.  (Patient not taking: Reported on 03/13/2021)  0   sulfamethoxazole-trimethoprim (BACTRIM) 400-80 MG tablet Take 1 tablet by mouth 2 (two) times daily. (Patient not taking: Reported on 03/13/2021) 14 tablet 0   tiotropium (SPIRIVA HANDIHALER) 18 MCG inhalation capsule Place 1 capsule (18 mcg total) into inhaler and inhale 1 day or 1 dose. (Patient not taking: Reported on 03/13/2021) 30 capsule 2   No current facility-administered medications for this visit.      REVIEW OF SYSTEMS (Negative unless checked)  Constitutional: [] Weight loss  [] Fever  [] Chills Cardiac: [] Chest pain   [] Chest pressure   [] Palpitations   [] Shortness of breath when laying flat   [] Shortness of breath at rest   [x] Shortness of breath with exertion. Vascular:  [] Pain in legs with walking   [] Pain in legs at rest   [] Pain in legs when laying flat   [] Claudication   [] Pain in feet when walking  [] Pain in feet at rest  [] Pain in feet when laying flat   [] History of DVT   [] Phlebitis   [] Swelling in legs   [] Varicose veins   [] Non-healing ulcers Pulmonary:   [] Uses home oxygen   [] Productive cough   [] Hemoptysis   [] Wheeze  [x] COPD   [] Asthma Neurologic:  [x] Dizziness  [] Blackouts    [] Seizures   [x] History of stroke   [] History of TIA  [] Aphasia   [] Temporary blindness   [] Dysphagia   [] Weakness or numbness in arms   [] Weakness or numbness in legs Musculoskeletal:  [x] Arthritis   [] Joint swelling   [] Joint pain   []   Low back pain Hematologic:  [] Easy bruising  [] Easy bleeding   [] Hypercoagulable state   [] Anemic  [] Hepatitis Gastrointestinal:  [] Blood in stool   [] Vomiting blood  [x] Gastroesophageal reflux/heartburn   [] Abdominal pain Genitourinary:  [] Chronic kidney disease   [] Difficult urination  [] Frequent urination  [] Burning with urination   [] Hematuria Skin:  [] Rashes   [] Ulcers   [] Wounds Psychological:  [] History of anxiety   []  History of major depression.    Physical Exam BP (!) 197/78 (BP Location: Right Arm)   Pulse 83   Resp 16   Wt 133 lb 12.8 oz (60.7 kg)   BMI 22.97 kg/m  Gen:  WD/WN, NAD Head: [place]/AT, No temporalis wasting.  Ear/Nose/Throat: Hearing grossly intact, nares w/o erythema or drainage, oropharynx w/o Erythema/Exudate Eyes: Conjunctiva clear, sclera non-icteric  Neck: trachea midline.  No bruit or JVD.  Pulmonary:  Good air movement, clear to auscultation bilaterally.  Cardiac: RRR, no JVD Vascular:  Vessel Right Left  Radial Palpable Palpable                   Musculoskeletal: M/S 5/5 throughout.  Extremities without ischemic changes.  No deformity or atrophy.  No edema. Neurologic: Sensation grossly intact in extremities.  Symmetrical.  Speech is fluent. Motor exam as listed above. Psychiatric: Judgment intact, Mood & affect appropriate for pt's clinical situation. Dermatologic: No rashes or ulcers noted.  No cellulitis or open wounds.    Radiology No results found.  Labs Recent Results (from the past 2160 hour(s))  CBC     Status: Abnormal   Collection Time: 01/27/21  7:18 AM  Result Value Ref Range   WBC 4.3 4.0 - 10.5 K/uL   RBC 5.35 (H) 3.87 - 5.11 MIL/uL   Hemoglobin 16.1 (H) 12.0 - 15.0 g/dL   HCT 45.8 36.0 -  46.0 %   MCV 85.6 80.0 - 100.0 fL   MCH 30.1 26.0 - 34.0 pg   MCHC 35.2 30.0 - 36.0 g/dL   RDW 12.1 11.5 - 15.5 %   Platelets 142 (L) 150 - 400 K/uL   nRBC 0.0 0.0 - 0.2 %    Comment: Performed at [facility], [address], [place], [place] [address]  Comprehensive metabolic panel     Status: Abnormal   Collection Time: 01/27/21  7:18 AM  Result Value Ref Range   Sodium 127 (L) 135 - 145 mmol/L   Potassium 4.3 3.5 - 5.1 mmol/L   Chloride 94 (L) 98 - 111 mmol/L   CO2 26 22 - 32 mmol/L   Glucose, Bld 92 70 - 99 mg/dL    Comment: Glucose reference range applies only to samples taken after fasting for at least 8 hours.   BUN 8 8 - 23 mg/dL   Creatinine, Ser 0.46 0.44 - 1.00 mg/dL   Calcium 9.4 8.9 - 10.3 mg/dL   Total Protein 7.0 6.5 - 8.1 g/dL   Albumin 3.8 3.5 - 5.0 g/dL   AST 20 15 - 41 U/L   ALT 21 0 - 44 U/L   Alkaline Phosphatase 48 38 - 126 U/L   Total Bilirubin 0.5 0.3 - 1.2 mg/dL   GFR, Estimated >60 >60 mL/min    Comment: (NOTE) Calculated using the CKD-EPI Creatinine Equation (2021)    Anion gap 7 5 - 15    Comment: Performed at [facility], [address], [place], [place] [address]    Assessment/Plan:  Bilateral carotid artery stenosis We performed a carotid  duplex today which demonstrates 40 to 59% ICA stenosis bilaterally which is similar to her study from 2019. At this time, it is unlikely that her carotid disease is the cause of her dizziness or pain.  Her vertebral arteries are antegrade as well so I do not have a good cause of her dizziness from a vascular standpoint.  No intervention will be recommended at this level of disease.  Recommend yearly follow-up with carotid duplex and continued aspirin therapy  Essential hypertension blood pressure control important in reducing the progression of atherosclerotic disease. On appropriate oral medications.   History of TIAs Carotid disease remains in the 40 to 59% range bilaterally's would  not benefit from intervention.  Continue aspirin.  Chronic obstructive pulmonary disease, unspecified ([place]) Continue pulmonary medications and aerosols as already ordered, these medications have been reviewed and there are no changes at this time.      Leotis Pain 03/13/2021, 9:57 AM   This note was created with Dragon medical transcription system.  Any errors from dictation are unintentional.

## 2021-03-13 NOTE — Assessment & Plan Note (Signed)
blood pressure control important in reducing the progression of atherosclerotic disease. On appropriate oral medications.  

## 2021-03-13 NOTE — Assessment & Plan Note (Signed)
We performed a carotid duplex today which demonstrates 40 to 59% ICA stenosis bilaterally which is similar to her study from 2019. At this time, it is unlikely that her carotid disease is the cause of her dizziness or pain.  Her vertebral arteries are antegrade as well so I do not have a good cause of her dizziness from a vascular standpoint.  No intervention will be recommended at this level of disease.  Recommend yearly follow-up with carotid duplex and continued aspirin therapy

## 2021-03-13 NOTE — Assessment & Plan Note (Signed)
Continue pulmonary medications and aerosols as already ordered, these medications have been reviewed and there are no changes at this time.   

## 2021-03-13 NOTE — Assessment & Plan Note (Signed)
Carotid disease remains in the 40 to 59% range bilaterally's would not benefit from intervention.  Continue aspirin.

## 2021-03-27 ENCOUNTER — Ambulatory Visit: Payer: Medicaid Other | Admitting: Internal Medicine

## 2021-04-23 ENCOUNTER — Ambulatory Visit: Payer: Medicaid Other | Admitting: Internal Medicine

## 2021-05-08 ENCOUNTER — Other Ambulatory Visit: Payer: Self-pay | Admitting: Pediatrics

## 2021-05-08 ENCOUNTER — Ambulatory Visit
Admission: RE | Admit: 2021-05-08 | Discharge: 2021-05-08 | Disposition: A | Discharge status: Home / Self Care | Payer: Medicaid Other | Attending: Pediatrics | Admitting: Pediatrics

## 2021-05-08 ENCOUNTER — Ambulatory Visit
Admission: RE | Admit: 2021-05-08 | Discharge: 2021-05-08 | Disposition: A | Discharge status: Home / Self Care | Payer: Medicaid Other | Source: Ambulatory Visit | Attending: Pediatrics | Admitting: Pediatrics

## 2021-05-08 ENCOUNTER — Other Ambulatory Visit: Payer: Self-pay

## 2021-05-08 DIAGNOSIS — R0781 Pleurodynia: Secondary | ICD-10-CM

## 2021-06-19 ENCOUNTER — Emergency Department
Admission: EM | Admit: 2021-06-19 | Discharge: 2021-06-19 | Disposition: A | Discharge status: Home / Self Care | Payer: Medicaid Other

## 2021-07-23 ENCOUNTER — Other Ambulatory Visit: Payer: Self-pay | Admitting: Nurse Practitioner

## 2021-07-23 ENCOUNTER — Other Ambulatory Visit (HOSPITAL_COMMUNITY): Payer: Self-pay | Admitting: Nurse Practitioner

## 2021-07-25 ENCOUNTER — Other Ambulatory Visit: Payer: Self-pay | Admitting: Pediatrics

## 2021-07-25 DIAGNOSIS — R634 Abnormal weight loss: Secondary | ICD-10-CM

## 2021-07-25 DIAGNOSIS — R1084 Generalized abdominal pain: Secondary | ICD-10-CM

## 2021-08-02 ENCOUNTER — Other Ambulatory Visit: Payer: Self-pay | Admitting: Nurse Practitioner

## 2021-08-02 DIAGNOSIS — R1084 Generalized abdominal pain: Secondary | ICD-10-CM

## 2021-08-02 DIAGNOSIS — R110 Nausea: Secondary | ICD-10-CM

## 2021-08-02 DIAGNOSIS — K5909 Other constipation: Secondary | ICD-10-CM

## 2021-08-02 DIAGNOSIS — K219 Gastro-esophageal reflux disease without esophagitis: Secondary | ICD-10-CM

## 2021-08-02 DIAGNOSIS — R197 Diarrhea, unspecified: Secondary | ICD-10-CM

## 2021-08-17 ENCOUNTER — Ambulatory Visit: Payer: Medicaid Other

## 2021-08-23 ENCOUNTER — Emergency Department: Payer: Medicaid Other

## 2021-08-23 ENCOUNTER — Emergency Department
Admission: EM | Admit: 2021-08-23 | Discharge: 2021-08-23 | Disposition: A | Discharge status: Home / Self Care | Payer: Medicaid Other | Source: Home / Self Care | Attending: Emergency Medicine | Admitting: Emergency Medicine

## 2021-08-23 ENCOUNTER — Encounter: Payer: Self-pay | Admitting: Emergency Medicine

## 2021-08-23 ENCOUNTER — Other Ambulatory Visit: Payer: Self-pay

## 2021-08-23 ENCOUNTER — Observation Stay
Admission: EM | Admit: 2021-08-23 | Discharge: 2021-08-24 | Disposition: A | Discharge status: Home / Self Care | Payer: Medicaid Other | Attending: Obstetrics and Gynecology | Admitting: Obstetrics and Gynecology

## 2021-08-23 DIAGNOSIS — K5900 Constipation, unspecified: Secondary | ICD-10-CM | POA: Insufficient documentation

## 2021-08-23 DIAGNOSIS — K219 Gastro-esophageal reflux disease without esophagitis: Secondary | ICD-10-CM | POA: Insufficient documentation

## 2021-08-23 DIAGNOSIS — R339 Retention of urine, unspecified: Secondary | ICD-10-CM

## 2021-08-23 DIAGNOSIS — F319 Bipolar disorder, unspecified: Secondary | ICD-10-CM

## 2021-08-23 DIAGNOSIS — R1084 Generalized abdominal pain: Secondary | ICD-10-CM

## 2021-08-23 DIAGNOSIS — I10 Essential (primary) hypertension: Secondary | ICD-10-CM | POA: Diagnosis present

## 2021-08-23 DIAGNOSIS — Z79899 Other long term (current) drug therapy: Secondary | ICD-10-CM | POA: Insufficient documentation

## 2021-08-23 DIAGNOSIS — I119 Hypertensive heart disease without heart failure: Secondary | ICD-10-CM | POA: Insufficient documentation

## 2021-08-23 DIAGNOSIS — Z85828 Personal history of other malignant neoplasm of skin: Secondary | ICD-10-CM | POA: Insufficient documentation

## 2021-08-23 DIAGNOSIS — J9601 Acute respiratory failure with hypoxia: Secondary | ICD-10-CM | POA: Diagnosis not present

## 2021-08-23 DIAGNOSIS — J449 Chronic obstructive pulmonary disease, unspecified: Secondary | ICD-10-CM | POA: Insufficient documentation

## 2021-08-23 DIAGNOSIS — I2510 Atherosclerotic heart disease of native coronary artery without angina pectoris: Secondary | ICD-10-CM | POA: Insufficient documentation

## 2021-08-23 DIAGNOSIS — R338 Other retention of urine: Secondary | ICD-10-CM

## 2021-08-23 DIAGNOSIS — F17200 Nicotine dependence, unspecified, uncomplicated: Secondary | ICD-10-CM | POA: Insufficient documentation

## 2021-08-23 DIAGNOSIS — Z20822 Contact with and (suspected) exposure to covid-19: Secondary | ICD-10-CM | POA: Diagnosis not present

## 2021-08-23 DIAGNOSIS — J441 Chronic obstructive pulmonary disease with (acute) exacerbation: Secondary | ICD-10-CM | POA: Diagnosis not present

## 2021-08-23 DIAGNOSIS — Z7982 Long term (current) use of aspirin: Secondary | ICD-10-CM | POA: Insufficient documentation

## 2021-08-23 DIAGNOSIS — R0902 Hypoxemia: Secondary | ICD-10-CM

## 2021-08-23 DIAGNOSIS — R0602 Shortness of breath: Secondary | ICD-10-CM | POA: Diagnosis present

## 2021-08-23 LAB — CBC WITH DIFFERENTIAL/PLATELET
Abs Immature Granulocytes: 0.05 10*3/uL (ref 0.00–0.07)
Basophils Absolute: 0 10*3/uL (ref 0.0–0.1)
Basophils Relative: 0 %
Eosinophils Absolute: 0.2 10*3/uL (ref 0.0–0.5)
Eosinophils Relative: 2 %
HCT: 45.7 % (ref 36.0–46.0)
Hemoglobin: 16 g/dL — ABNORMAL HIGH (ref 12.0–15.0)
Immature Granulocytes: 1 %
Lymphocytes Relative: 32 %
Lymphs Abs: 2.3 10*3/uL (ref 0.7–4.0)
MCH: 31.3 pg (ref 26.0–34.0)
MCHC: 35 g/dL (ref 30.0–36.0)
MCV: 89.3 fL (ref 80.0–100.0)
Monocytes Absolute: 0.6 10*3/uL (ref 0.1–1.0)
Monocytes Relative: 9 %
Neutro Abs: 4 10*3/uL (ref 1.7–7.7)
Neutrophils Relative %: 56 %
Platelets: 214 10*3/uL (ref 150–400)
RBC: 5.12 MIL/uL — ABNORMAL HIGH (ref 3.87–5.11)
RDW: 12.4 % (ref 11.5–15.5)
WBC: 7.1 10*3/uL (ref 4.0–10.5)
nRBC: 0 % (ref 0.0–0.2)

## 2021-08-23 LAB — URINALYSIS, ROUTINE W REFLEX MICROSCOPIC
Bilirubin Urine: NEGATIVE
Glucose, UA: NEGATIVE mg/dL
Ketones, ur: NEGATIVE mg/dL
Nitrite: NEGATIVE
Protein, ur: NEGATIVE mg/dL
Specific Gravity, Urine: <1.005 — ABNORMAL LOW (ref 1.005–1.030)
pH: 6 (ref 5.0–8.0)

## 2021-08-23 LAB — COMPREHENSIVE METABOLIC PANEL
ALT: 19 U/L (ref 0–44)
AST: 19 U/L (ref 15–41)
Albumin: 3.8 g/dL (ref 3.5–5.0)
Alkaline Phosphatase: 49 U/L (ref 38–126)
Anion gap: 6 (ref 5–15)
BUN: 13 mg/dL (ref 8–23)
CO2: 29 mmol/L (ref 22–32)
Calcium: 9.7 mg/dL (ref 8.9–10.3)
Chloride: 91 mmol/L — ABNORMAL LOW (ref 98–111)
Creatinine, Ser: 0.47 mg/dL (ref 0.44–1.00)
GFR, Estimated: >60 mL/min (ref >60)
Glucose, Bld: 103 mg/dL — ABNORMAL HIGH (ref 70–99)
Potassium: 4 mmol/L (ref 3.5–5.1)
Sodium: 126 mmol/L — ABNORMAL LOW (ref 135–145)
Total Bilirubin: 0.7 mg/dL (ref 0.3–1.2)
Total Protein: 6.9 g/dL (ref 6.5–8.1)

## 2021-08-23 LAB — URINALYSIS, MICROSCOPIC (REFLEX)
Bacteria, UA: NONE SEEN
RBC / HPF: NONE SEEN RBC/hpf (ref 0–5)

## 2021-08-23 LAB — LIPASE, BLOOD: Lipase: 110 U/L — ABNORMAL HIGH (ref 11–51)

## 2021-08-23 MED ORDER — SODIUM CHLORIDE 0.9 % IV BOLUS
1000.0000 mL | Freq: Once | INTRAVENOUS | Status: AC
  Administered 2021-08-23: 1000 mL via INTRAVENOUS

## 2021-08-23 MED ORDER — BISACODYL 10 MG RE SUPP
10.0000 mg | Freq: Once | RECTAL | Status: AC
  Administered 2021-08-23: 10 mg via RECTAL
  Filled 2021-08-23: qty 1

## 2021-08-23 MED ORDER — IOHEXOL 300 MG/ML  SOLN
100.0000 mL | Freq: Once | INTRAMUSCULAR | Status: AC | PRN
  Administered 2021-08-23: 100 mL via INTRAVENOUS
  Filled 2021-08-23: qty 100

## 2021-08-23 MED ORDER — ONDANSETRON HCL 4 MG/2ML IJ SOLN
4.0000 mg | Freq: Once | INTRAMUSCULAR | Status: AC
  Administered 2021-08-23: 4 mg via INTRAVENOUS
  Filled 2021-08-23: qty 2

## 2021-08-23 NOTE — Discharge Instructions (Signed)
Please keep the urinary catheter in place for the next week and follow-up with urology for further assessment.  Do not attempt to remove the catheter, as it has an inflated balloon inside the bladder.

## 2021-08-23 NOTE — ED Triage Notes (Signed)
Pt comes into the ED via POV c/o constipation.  Pt states she is able to pass small amounts of stool at a time, but she had an outpatient xray completed yesterday that shows she is impacted.  Pt in NAD at this time with even and unlabored respirations.  Pt has been taking OTC stool softeners.

## 2021-08-23 NOTE — ED Notes (Signed)
Pt up to use bathroom 

## 2021-08-23 NOTE — ED Triage Notes (Signed)
Pt to ED via EMS from home with c/o foley catheter problems. Pt seen earlier today for constipation, had foley placed. Pt c/o continued pain and states is urinating around the foley and states she has the sensation of needing to urinate despite having the foley. Pt states pain in her bladder due to the catheter and is requesting the foley to be removed.   Pt states is supposed to be on chronic O2 but is not wearing any, RA sats 88%.

## 2021-08-23 NOTE — ED Notes (Signed)
Bladder scan performed at this time. 465 mL residual noted. Provider notified.

## 2021-08-23 NOTE — ED Provider Notes (Signed)
[facility] Emergency Department Provider Note  ____________________________________________  Time seen: Approximately 12:19 PM  I have reviewed the triage vital signs and the nursing notes.   HISTORY  Chief Complaint Constipation    HPI Sylvia Richardson is a 65 y.o. female with a past history of CAD COPD hypertension bipolar disorder who comes ED complaining of generalized abdominal pain and constipation.  This is been going on for several days, gradual onset, waxing waning, no aggravating alleviating factors, associate with nausea, no vomiting.  No fever.  She reports passing small amounts of hard stool.  She reports being seen at [facility] clinic yesterday having x-ray and labs and being told to come over here to the ED.  I am not able to find those results in Care Everywhere.    Past Medical History:  Diagnosis Date   Bipolar disorder ([place])    Cancer ([place])    skin   Carotid artery disease ([place])    COPD (chronic obstructive pulmonary disease) ([place])    H/O degenerative disc disease    Hypercholesteremia    Hypertension    Stroke [facility])      Patient Active Problem List   Diagnosis Date Noted   Hx of nonmelanoma skin cancer 03/13/2021   Anxiety, generalized 12/13/2020   Recurrent cold sores 11/02/2020   Age-related osteoporosis without current pathological fracture 03/19/2019   PAD (peripheral artery disease) ([place]) 41/28/7867   Lichen sclerosus 67/20/9470   Bilateral carotid artery stenosis 02/14/2017   Calculus of kidney 01/23/2016   History of TIAs 06/20/2015   Social phobia 02/16/2015   Headache disorder 12/15/2014   Renal infarct ([place]) 11/18/2014   SUI (stress urinary incontinence, female) 11/18/2014   Arthropathy 11/17/2014   Gastro-esophageal reflux disease without esophagitis 11/17/2014   Malignant neoplasm ([place]) 11/17/2014   Candidiasis of perineum 03/23/2014   Incomplete emptying of bladder 03/23/2014   Pure  hyperglyceridemia 12/02/2013   Hyponatremia 12/02/2013   Hyposmolality and/or hyponatremia 12/02/2013   Bladder pain 11/18/2013   Urge incontinence 11/18/2013   Microscopic hematuria 06/30/2013   Urinary incontinence without sensory awareness 06/30/2013   Anxiety state 02/20/2013   Chronic pain 02/20/2013   Localized osteoarthrosis, lower leg 02/20/2013   Low back pain 02/20/2013   Other depressive disorder 02/20/2013   Pain in joint involving lower leg 02/20/2013   Essential hypertension 06/15/2010   Atopic dermatitis and related condition 03/21/2010   Chronic obstructive pulmonary disease, unspecified ([place]) 03/21/2010   Nicotine dependence, unspecified, uncomplicated 96/28/3662   Malaise and fatigue 01/03/2010   Mixed emotional features as adjustment reaction 01/03/2010   Impaired fasting glucose 11/28/2009   Obstructive chronic bronchitis ([place]) 09/07/2009   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 Authorizing Provider  aspirin EC 81 MG tablet Take 81 mg by mouth daily.  10/02/10   [provider]  atenolol (TENORMIN) 100 MG tablet take 1/2 to 1 tablet by mouth once daily 01/17/17   [provider]  busPIRone (BUSPAR) 15 MG tablet  Take 15 mg by mouth daily.  01/06/17   [provider]  clonazePAM (KLONOPIN) 1 MG tablet Take 1 mg by mouth daily as needed for anxiety.     [provider]  CYMBALTA 60 MG capsule Take 120 mg by mouth daily.     [provider]  DALIRESP 500 MCG TABS tablet  TAKE 1 TABLET BY MOUTH EVERY DAY(PLEASE KEEP APPT. IN JULY) 02/28/21   Allyne Gee, MD  dicyclomine (BENTYL) 20 MG tablet Take 1 tablet (20 mg total) by mouth 3 (three) times daily as needed (abdominal pain). 11/12/17   Nance Pear, MD  esomeprazole (NEXIUM) 40 MG capsule Take 40 mg by mouth daily.  10/31/16   [provider]  fluticasone (FLONASE) 50 MCG/ACT nasal spray Place into the nose. 12/17/11   [provider]  HYDROcodone-acetaminophen (NORCO) 5-325 MG tablet Take 1 tablet by mouth every 6 (six) hours as needed for moderate pain. Patient not taking: Reported on 03/13/2021 08/26/15   Daymon Larsen, MD  ipratropium-albuterol (DUONEB) 0.5-2.5 (3) MG/3ML SOLN Take 3 mLs by nebulization every 6 (six) hours as needed (shortness of breath). 11/24/18   Kendell Bane, NP  metoCLOPramide (REGLAN) 10 MG tablet Take 10 mg by mouth every 6 (six) hours as needed for nausea or vomiting.     [provider]  montelukast (SINGULAIR) 10 MG tablet Take 10 mg by mouth at bedtime.  02/26/14   [provider]  ondansetron (ZOFRAN ODT) 4 MG disintegrating tablet Take 1 tablet (4 mg total) by mouth every 8 (eight) hours as needed. 01/27/21   Lavonia Drafts, MD  ondansetron (ZOFRAN) 4 MG tablet Take 1 tablet (4 mg total) by mouth every 8 (eight) hours as needed for nausea or vomiting. 11/12/17   Nance Pear, MD  prednisoLONE acetate (PRED FORTE) 1 % ophthalmic suspension SHAKE LQ AND INT 1 GTT IN OS TID Patient not taking: Reported on 03/13/2021 04/16/18   [provider]  predniSONE (DELTASONE) 10 MG tablet Take 1 tablet 3 times a day with a meal for 2 days, take 1 tablet 2 times a day for 2 days, take 1 tablet once daily for 2 days. Patient not taking: Reported on 03/13/2021 06/08/20   Luiz Ochoa, NP  PROVENTIL HFA 108 [phone] Base) MCG/ACT inhaler INHALE 2 PUFFS INTO THE LUNGS EVERY 4 HOURS AS NEEDED FOR WHEEZING OR SHORTNESS OF BREATH 05/01/20   Allyne Gee, MD   ranitidine (ZANTAC) 300 MG tablet Take 150 mg by mouth daily.  Patient not taking: Reported on 03/13/2021 12/27/16   [provider]  sulfamethoxazole-trimethoprim (BACTRIM) 400-80 MG tablet Take 1 tablet by mouth 2 (two) times daily. Patient not taking: Reported on 03/13/2021 06/08/20   Luiz Ochoa, NP  tiotropium (SPIRIVA HANDIHALER) 18 MCG inhalation capsule Place 1 capsule (18 mcg total) into inhaler and inhale 1 day or 1 dose. Patient not taking: Reported on 03/13/2021 11/11/19   Kendell Bane, NP  traZODone (DESYREL) 100 MG tablet Take 200 mg by mouth at bedtime as needed for sleep.  10/20/14   [provider]     Allergies Bactrim [sulfamethoxazole-trimethoprim], Ciprofloxacin, Clindamycin/lincomycin, Zithromax [azithromycin], Choline fenofibrate, Doxycycline, Fish oil, Levaquin [levofloxacin], Niacin, Nifedipine, Penicillins, and Rosuvastatin   Family History  Problem Relation Age of Onset   Prostate cancer Father    Stroke Maternal Grandfather    Stroke Paternal Grandfather    Breast cancer Neg Hx     Social History Social  History   Tobacco Use   Smoking status: Every Day   Smokeless tobacco: Never   Tobacco comments:    10 cigarettes a day   Vaping Use   Vaping Use: Never used  Substance Use Topics   Alcohol use: No   Drug use: No    Review of Systems  Constitutional:   No fever or chills.  ENT:   No sore throat. No rhinorrhea. Cardiovascular:   No chest pain or syncope. Respiratory:   No dyspnea or cough. Gastrointestinal:   Positive as above for abdominal pain and constipation. Musculoskeletal:   Negative for focal pain or swelling All other systems reviewed and are negative except as documented above in ROS and HPI.  ____________________________________________   PHYSICAL EXAM:  VITAL SIGNS: ED Triage Vitals [08/23/21 1150]  Enc Vitals Group     BP 130/75     Pulse Rate 79     Resp 18     Temp 98.5 F (36.9 C)     Temp  Source Oral     SpO2 94 %     Weight 133 lb 13.1 oz (60.7 kg)     Height 5\' 4"  (1.626 m)     Head Circumference      Peak Flow      Pain Score 7     Pain Loc      Pain Edu?      Excl. in [place]?     Vital signs reviewed, nursing assessments reviewed.   Constitutional:   Alert and oriented. Non-toxic appearance. Eyes:   Conjunctivae are normal. EOMI. PERRL. ENT      Head:   Normocephalic and atraumatic.      Nose:   Wearing a mask.      Mouth/Throat:   Wearing a mask.      Neck:   No meningismus. Full ROM. Hematological/Lymphatic/Immunilogical:   No cervical lymphadenopathy. Cardiovascular:   RRR. Symmetric bilateral radial and DP pulses.  No murmurs. Cap refill less than 2 seconds. Respiratory:   Normal respiratory effort without tachypnea/retractions. Breath sounds are clear and equal bilaterally. No wheezes/rales/rhonchi. Gastrointestinal:   Soft with generalized tenderness. Non distended. .  No rebound, rigidity, or guarding.  Musculoskeletal:   Normal range of motion in all extremities. No joint effusions.  No lower extremity tenderness.  No edema. Neurologic:   Normal speech and language.  Motor grossly intact. No acute focal neurologic deficits are appreciated.  Skin:    Skin is warm, dry and intact. No rash noted.  No petechiae, purpura, or bullae.  ____________________________________________    LABS (pertinent positives/negatives) (all labs ordered are listed, but only abnormal results are displayed) Labs Reviewed  CBC WITH DIFFERENTIAL/PLATELET - Abnormal; Notable for the following components:      Result Value   RBC 5.12 (*)    Hemoglobin 16.0 (*)    All other components within normal limits  COMPREHENSIVE METABOLIC PANEL - Abnormal; Notable for the following components:   Sodium 126 (*)    Chloride 91 (*)    Glucose, Bld 103 (*)    All other components within normal limits  LIPASE, BLOOD - Abnormal; Notable for the following components:   Lipase 110 (*)     All other components within normal limits  URINALYSIS, ROUTINE W REFLEX MICROSCOPIC - Abnormal; Notable for the following components:   Specific Gravity, Urine <1.005 (*)    Hgb urine dipstick SMALL (*)    Leukocytes,Ua TRACE (*)    All other  components within normal limits  URINALYSIS, MICROSCOPIC (REFLEX)   ____________________________________________   EKG    ____________________________________________    RADIOLOGY  CT ABDOMEN PELVIS W CONTRAST  Result Date: 08/23/2021 CLINICAL DATA:  Acute nonlocalized abdominal pain in an 65 year old female. EXAM: CT ABDOMEN AND PELVIS WITH CONTRAST TECHNIQUE: Multidetector CT imaging of the abdomen and pelvis was performed using the standard protocol following bolus administration of intravenous contrast. CONTRAST:  139mL OMNIPAQUE IOHEXOL 300 MG/ML  SOLN COMPARISON:  Comparison is made with May 21, 2013. FINDINGS: Lower chest: Minimal basilar atelectasis, no effusion. No consolidation. Hepatobiliary: Post cholecystectomy with stable hepatic cyst near the dome of the RIGHT hemi liver. Mild biliary duct distension within expected range for cholecystectomy changes. Pancreas: Normal, without mass, inflammation or ductal dilatation. Spleen: Spleen normal size and contour. Adrenals/Urinary Tract: Adrenal glands are unremarkable. Symmetric renal enhancement. No sign of hydronephrosis. No suspicious renal lesion or perinephric stranding. Urinary bladder is grossly unremarkable. . Signs of renal cortical scarring. Nephrolithiasis on the RIGHT with a 4 mm calculus. Renal cysts are present bilaterally. Urinary bladder is moderately distended extending into the low abdomen/upper pelvis. Stomach/Bowel: Postoperative changes about the GE junction. This has the appearance of Nissen fundoplication and has occurred since the prior imaging evaluation. No small bowel wall thickening or stranding adjacent to the small bowel. No signs of small bowel obstruction.  Scattered fluid-filled loops of small bowel and colon are present throughout the abdomen. The sigmoid is redundant but nondilated. The appendix is not visualized, no secondary signs to suggest acute appendicitis. Vascular/Lymphatic: Aortic atherosclerosis. No sign of aneurysm. Smooth contour of the IVC. There is no gastrohepatic or hepatoduodenal ligament lymphadenopathy. No retroperitoneal or mesenteric lymphadenopathy. No pelvic sidewall lymphadenopathy. Reproductive: Unremarkable by CT.  No adnexal masses. Other: No free air.  No ascites. Musculoskeletal: No acute bone finding. No destructive bone process. Spinal degenerative changes. IMPRESSION: Scattered fluid-filled loops of small bowel and colon are present throughout the abdomen. Findings can be seen in the setting of gastroenteritis. No signs of fecal impaction. Distension of the urinary bladder which is moderately distended extending to the L5 level on sagittal images. Correlate with any urinary retention or signs of bladder outlet obstruction. Interval changes of Nissen fundoplication. Nonobstructive RIGHT nephrolithiasis. Aortic atherosclerosis. Aortic Atherosclerosis (ICD10-I70.0). Electronically Signed   By: Zetta Bills M.D.   On: 08/23/2021 14:16    ____________________________________________   PROCEDURES Procedures  ____________________________________________  DIFFERENTIAL DIAGNOSIS   Bowel obstruction, pancreatitis, diverticulitis, appendicitis, constipation, electrolyte abnormality, dehydration  CLINICAL IMPRESSION / ASSESSMENT AND PLAN / ED COURSE  Medications ordered in the ED: Medications  sodium chloride 0.9 % bolus 1,000 mL (1,000 mLs Intravenous New Bag/Given 08/23/21 1303)  ondansetron (ZOFRAN) injection 4 mg (4 mg Intravenous Given 08/23/21 1302)  bisacodyl (DULCOLAX) suppository 10 mg (10 mg Rectal Given 08/23/21 1312)  iohexol (OMNIPAQUE) 300 MG/ML solution 100 mL (100 mLs Intravenous Contrast Given 08/23/21 1356)     Pertinent labs & imaging results that were available during my care of the patient were reviewed by me and considered in my medical decision making (see chart for details).  Sylvia Richardson was evaluated in Emergency Department on 08/23/2021 for the symptoms described in the history of present illness. She was evaluated in the context of the global COVID-19 pandemic, which necessitated consideration that the patient might be at risk for infection with the SARS-CoV-2 virus that causes COVID-19. Institutional protocols and algorithms that pertain to the evaluation of patients at risk for COVID-19 are  in a state of rapid change based on information released by regulatory bodies including the CDC and federal and state organizations. These policies and algorithms were followed during the patient's care in the ED.   Patient presents with constipation and abdominal pain.  Vital signs are unremarkable, she is not in distress.  Her gastroenterologist had plan to get a CT scan which I will obtain today.  We will check labs due to history of hyponatremia and electrolyte abnormality possibly precipitating constipation.  Will give IV fluids for hydration.  Will try Dulcolax suppository pending outcome of CT.  Clinical Course as of 08/23/21 1522  Thu Aug 23, 2021  1451 Patient had a large bowel movement and urinated after CT scan.  She feels much better and pain is resolved.  Nontender.  Nontoxic, normal vitals.  Will obtain bladder scan to evaluate for urinary retention and then plan for discharge. [PS]  1451 Hyponatremia appears chronic and asymptomatic. [PS]    Clinical Course User Index [PS] Carrie Mew, MD     ----------------------------------------- 3:22 PM on 08/23/2021 ----------------------------------------- Bladder scan showed almost 500 mL.  Patient notes having to wear depends due to frequent small-volume urinary incontinence which appears to be overflow will place Foley  catheter and recommend follow-up with urology.  ____________________________________________   FINAL CLINICAL IMPRESSION(S) / ED DIAGNOSES    Final diagnoses:  Generalized abdominal pain  Urinary retention     ED Discharge Orders     None       Portions of this note were generated with dragon dictation software. Dictation errors may occur despite best attempts at proofreading.    Carrie Mew, MD 08/23/21 [phone]

## 2021-08-24 ENCOUNTER — Ambulatory Visit: Admission: RE | Admit: 2021-08-24 | Payer: Medicaid Other | Source: Ambulatory Visit

## 2021-08-24 DIAGNOSIS — J441 Chronic obstructive pulmonary disease with (acute) exacerbation: Secondary | ICD-10-CM | POA: Diagnosis present

## 2021-08-24 DIAGNOSIS — R0902 Hypoxemia: Secondary | ICD-10-CM

## 2021-08-24 DIAGNOSIS — F319 Bipolar disorder, unspecified: Secondary | ICD-10-CM

## 2021-08-24 DIAGNOSIS — K5900 Constipation, unspecified: Secondary | ICD-10-CM

## 2021-08-24 DIAGNOSIS — R338 Other retention of urine: Secondary | ICD-10-CM

## 2021-08-24 LAB — CBC WITH DIFFERENTIAL/PLATELET
Abs Immature Granulocytes: 0.06 10*3/uL (ref 0.00–0.07)
Basophils Absolute: 0 10*3/uL (ref 0.0–0.1)
Basophils Relative: 0 %
Eosinophils Absolute: 0.1 10*3/uL (ref 0.0–0.5)
Eosinophils Relative: 2 %
HCT: 43.9 % (ref 36.0–46.0)
Hemoglobin: 14.9 g/dL (ref 12.0–15.0)
Immature Granulocytes: 1 %
Lymphocytes Relative: 32 %
Lymphs Abs: 2.1 10*3/uL (ref 0.7–4.0)
MCH: 31.2 pg (ref 26.0–34.0)
MCHC: 33.9 g/dL (ref 30.0–36.0)
MCV: 91.8 fL (ref 80.0–100.0)
Monocytes Absolute: 0.7 10*3/uL (ref 0.1–1.0)
Monocytes Relative: 11 %
Neutro Abs: 3.7 10*3/uL (ref 1.7–7.7)
Neutrophils Relative %: 54 %
Platelets: 204 10*3/uL (ref 150–400)
RBC: 4.78 MIL/uL (ref 3.87–5.11)
RDW: 12.4 % (ref 11.5–15.5)
WBC: 6.7 10*3/uL (ref 4.0–10.5)
nRBC: 0 % (ref 0.0–0.2)

## 2021-08-24 LAB — URINALYSIS, COMPLETE (UACMP) WITH MICROSCOPIC
Bacteria, UA: NONE SEEN
Bilirubin Urine: NEGATIVE
Glucose, UA: NEGATIVE mg/dL
Ketones, ur: NEGATIVE mg/dL
Nitrite: NEGATIVE
Protein, ur: NEGATIVE mg/dL
Specific Gravity, Urine: <1.005 — ABNORMAL LOW (ref 1.005–1.030)
pH: 6 (ref 5.0–8.0)

## 2021-08-24 LAB — BASIC METABOLIC PANEL
Anion gap: 4 — ABNORMAL LOW (ref 5–15)
BUN: 12 mg/dL (ref 8–23)
CO2: 31 mmol/L (ref 22–32)
Calcium: 9.5 mg/dL (ref 8.9–10.3)
Chloride: 97 mmol/L — ABNORMAL LOW (ref 98–111)
Creatinine, Ser: 0.52 mg/dL (ref 0.44–1.00)
GFR, Estimated: >60 mL/min (ref >60)
Glucose, Bld: 114 mg/dL — ABNORMAL HIGH (ref 70–99)
Potassium: 4.1 mmol/L (ref 3.5–5.1)
Sodium: 132 mmol/L — ABNORMAL LOW (ref 135–145)

## 2021-08-24 LAB — RESP PANEL BY RT-PCR (FLU A&B, COVID) ARPGX2
Influenza A by PCR: NEGATIVE
Influenza B by PCR: NEGATIVE
SARS Coronavirus 2 by RT PCR: NEGATIVE

## 2021-08-24 LAB — TROPONIN I (HIGH SENSITIVITY): Troponin I (High Sensitivity): 4 ng/L (ref <18)

## 2021-08-24 LAB — HIV ANTIBODY (ROUTINE TESTING W REFLEX): HIV Screen 4th Generation wRfx: NONREACTIVE

## 2021-08-24 MED ORDER — ONDANSETRON HCL 4 MG/2ML IJ SOLN: 4.0000 mg | Freq: Four times a day (QID) | INTRAMUSCULAR | Status: DC | PRN

## 2021-08-24 MED ORDER — BISACODYL 5 MG PO TBEC: 5.0000 mg | DELAYED_RELEASE_TABLET | Freq: Every day | ORAL | Status: DC | PRN

## 2021-08-24 MED ORDER — ACETAMINOPHEN 650 MG RE SUPP: 650.0000 mg | Freq: Four times a day (QID) | RECTAL | Status: DC | PRN

## 2021-08-24 MED ORDER — ALBUTEROL SULFATE (2.5 MG/3ML) 0.083% IN NEBU: 2.5000 mg | INHALATION_SOLUTION | RESPIRATORY_TRACT | Status: DC | PRN

## 2021-08-24 MED ORDER — ENOXAPARIN SODIUM 40 MG/0.4ML IJ SOSY
40.0000 mg | PREFILLED_SYRINGE | INTRAMUSCULAR | Status: DC
  Filled 2021-08-24: qty 0.4

## 2021-08-24 MED ORDER — ONDANSETRON HCL 4 MG PO TABS: 4.0000 mg | ORAL_TABLET | Freq: Four times a day (QID) | ORAL | Status: DC | PRN

## 2021-08-24 MED ORDER — IPRATROPIUM-ALBUTEROL 0.5-2.5 (3) MG/3ML IN SOLN
3.0000 mL | Freq: Four times a day (QID) | RESPIRATORY_TRACT | Status: DC
  Administered 2021-08-24: 3 mL via RESPIRATORY_TRACT
  Filled 2021-08-24: qty 3

## 2021-08-24 MED ORDER — PREDNISONE 20 MG PO TABS: 40.0000 mg | ORAL_TABLET | Freq: Every day | ORAL | 0 refills | Status: DC

## 2021-08-24 MED ORDER — ONDANSETRON HCL 4 MG/2ML IJ SOLN
4.0000 mg | Freq: Once | INTRAMUSCULAR | Status: AC
  Administered 2021-08-24: 4 mg via INTRAVENOUS
  Filled 2021-08-24: qty 2

## 2021-08-24 MED ORDER — ACETAMINOPHEN 325 MG PO TABS: 650.0000 mg | ORAL_TABLET | Freq: Four times a day (QID) | ORAL | Status: DC | PRN

## 2021-08-24 MED ORDER — DICYCLOMINE HCL 10 MG PO CAPS
10.0000 mg | ORAL_CAPSULE | Freq: Once | ORAL | Status: AC
  Administered 2021-08-24: 10 mg via ORAL
  Filled 2021-08-24: qty 1

## 2021-08-24 MED ORDER — POLYETHYLENE GLYCOL 3350 17 G PO PACK
17.0000 g | PACK | Freq: Every day | ORAL | Status: DC
  Filled 2021-08-24: qty 1

## 2021-08-24 MED ORDER — IPRATROPIUM-ALBUTEROL 0.5-2.5 (3) MG/3ML IN SOLN
3.0000 mL | RESPIRATORY_TRACT | Status: AC
  Administered 2021-08-24: 3 mL via RESPIRATORY_TRACT
  Filled 2021-08-24: qty 3

## 2021-08-24 MED ORDER — TAMSULOSIN HCL 0.4 MG PO CAPS
0.4000 mg | ORAL_CAPSULE | Freq: Every day | ORAL | Status: DC
  Administered 2021-08-24: 0.4 mg via ORAL
  Filled 2021-08-24: qty 1

## 2021-08-24 MED ORDER — PREDNISONE 20 MG PO TABS: 40.0000 mg | ORAL_TABLET | Freq: Every day | ORAL | Status: DC

## 2021-08-24 MED ORDER — SENNOSIDES-DOCUSATE SODIUM 8.6-50 MG PO TABS: 1.0000 | ORAL_TABLET | Freq: Every evening | ORAL | Status: DC | PRN

## 2021-08-24 MED ORDER — METHYLPREDNISOLONE SODIUM SUCC 125 MG IJ SOLR
125.0000 mg | Freq: Once | INTRAMUSCULAR | Status: AC
  Administered 2021-08-24: 125 mg via INTRAVENOUS
  Filled 2021-08-24: qty 2

## 2021-08-24 MED ORDER — METHYLPREDNISOLONE SODIUM SUCC 40 MG IJ SOLR: 40.0000 mg | Freq: Two times a day (BID) | INTRAMUSCULAR | Status: DC

## 2021-08-24 NOTE — H&P (Signed)
History and Physical    Sylvia Richardson BTD:[id number] DOB: [birth year] DOA: 08/23/2021  PCP: Barbaraann Boys, MD   Patient coming from: home  I have personally briefly reviewed patient's relevant medical records in [place]  Chief Complaint: Shortness of breath, Foley catheter problem  HPI: Sylvia Richardson is a 65 y.o. female with medical history significant for Hypertension, COPD previously on oxygen, bipolar disorder, history of TIA, PAD, and urge incontinence who was seen and discharged from the ED several hours prior with Foley catheter when she presented with constipation and acute urinary retention, who returns to the ED with a complaint of leaking around the Foley catheter but also with a complaint of shortness of breath.  Patient stated that she has had a cough for the past several months that got worse over the past 3 weeks and then since she started feeling short of breath over the past few days.  She was short of breath when she presented to the ED earlier for the urinary retention but it started bothering her more when she got home.  She was previously on oxygen but did well without it and during the unit back in several months ago.  She has a cough that is productive of tenacious white phlegm.  She denies fever or chills and denies chest pain.  Denies nausea, vomiting, abdominal pain, diarrhea.  Does have problems with constipation and received treatment for it earlier and did have a bowel movement on her arrival home.  Denies other complaints  ED course: On arrival, vitals within normal limits and O2 sats within normal limits initially but falling to as low as 88% requiring 2 L to maintain sats in the mid 90s Blood work for the most part unremarkable  Chest x-ray no active cardiopulmonary disease CT abdomen and pelvis (done earlier in the ED) nonacute.  Signs of possible bladder outlet obstruction.  No signs of fecal impaction  Due to patient complaints of  leaking around the Foley catheter, catheter was removed in the ED.  Patient was treated with duo nebs and Solu-Medrol.  Hospitalist consulted for admission.   Review of Systems: As per HPI otherwise all other systems on review of systems negative.   Assessment/Plan    COPD with acute exacerbation (HCC)   Hypoxia - Schedule and as needed bronchodilator treatments - IV steroids - Supplemental O2    Acute urinary retention - Flomax - Currently on voiding trial - Replace Foley and urology outpatient follow-up if recurrent urinary retention    Constipation - MiraLAX daily and as needed laxatives    Essential hypertension - Continue home atenolol    Bipolar disorder ([place]) - Continue home meds pending med rec   DVT prophylaxis: Lovenox  Code Status: full code  Family Communication:  none  Disposition Plan: Back to previous home environment Consults called: none  Status:    Physical Exam: Vitals:   08/24/21 0006 08/24/21 0115 08/24/21 0130 08/24/21 0240  BP: (!) 150/49   (!) 99/40  Pulse: 93 78 91 80  Resp: 17   17  Temp: 97.8 F (36.6 C)   (!) 97.4 F (36.3 C)  TempSrc: Oral     SpO2: 97% 94% 98% 96%  Weight:      Height:       Constitutional: Alert, oriented x 3 . Not in any apparent distress HEENT:      Head: Normocephalic and atraumatic.         Eyes: PERLA, EOMI, Conjunctivae  are normal. Sclera is non-icteric.       Mouth/Throat: Mucous membranes are moist.       Neck: Supple with no signs of meningismus. Cardiovascular: Regular rate and rhythm. No murmurs, gallops, or rubs. 2+ symmetrical distal pulses are present . No JVD. No  LE edema Respiratory: Respiratory effort increased.  Scattered rhonchi. Gastrointestinal: Soft, non tender, non distended. Positive bowel sounds.  Genitourinary: No CVA tenderness. Musculoskeletal: Nontender with normal range of motion in all extremities. No cyanosis, or erythema of extremities. Neurologic:  Face is symmetric. Moving  all extremities. No gross focal neurologic deficits . Skin: Skin is warm, dry.  No rash or ulcers Psychiatric: Mood and affect are appropriate     Past Medical History:  Diagnosis Date   Bipolar disorder ([place])    Cancer ([place])    skin   Carotid artery disease (HCC)    COPD (chronic obstructive pulmonary disease) ([place])    H/O degenerative disc disease    Hypercholesteremia    Hypertension    Stroke [facility])     Past Surgical History:  Procedure Laterality Date   BREAST BIOPSY Left    core bx- neg   CESAREAN SECTION     CHOLECYSTECTOMY     TONSILLECTOMY     TUBAL LIGATION       reports that she has been smoking. She has never used smokeless tobacco. She reports that she does not drink alcohol and does not use drugs.  Allergies  Allergen Reactions   Bactrim [Sulfamethoxazole-Trimethoprim] Diarrhea   Ciprofloxacin Anaphylaxis   Clindamycin/Lincomycin Nausea And Vomiting   Zithromax [Azithromycin]     Abdomen pain and acid reflux    Choline Fenofibrate     Other reaction(s): Other (See Comments) Other Reaction: LEFT SIDE TONGUE SWELLING Other reaction(s): Other (See Comments) LEFT SIDE TONGUE SWELLING Other Reaction: LEFT SIDE TONGUE SWELLING   Doxycycline Nausea Only   Fish Oil Nausea Only   Levaquin [Levofloxacin] Rash   Niacin Diarrhea   Nifedipine Diarrhea   Penicillins Rash   Rosuvastatin     Other reaction(s): Headache    Family History  Problem Relation Age of Onset   Prostate cancer Father    Stroke Maternal Grandfather    Stroke Paternal Grandfather    Breast cancer Neg Hx       Prior to Admission medications   Medication Sig Start Date End Date Taking? Authorizing Provider  aspirin EC 81 MG tablet Take 81 mg by mouth daily.  10/02/10   [provider]  atenolol (TENORMIN) 100 MG tablet take 1/2 to 1 tablet by mouth once daily 01/17/17   [provider]  busPIRone (BUSPAR) 15 MG tablet Take 15 mg by mouth daily.  01/06/17   [provider]  clonazePAM (KLONOPIN) 1 MG tablet Take 1 mg by mouth daily as needed for anxiety.     [provider]  CYMBALTA 60 MG capsule Take 120 mg by mouth daily.     [provider]  DALIRESP 500 MCG TABS tablet TAKE 1 TABLET BY MOUTH EVERY DAY(PLEASE KEEP APPT. IN JULY) 02/28/21   Allyne Gee, MD  dicyclomine (BENTYL) 20 MG tablet Take 1 tablet (20 mg total) by mouth 3 (three) times daily as needed (abdominal pain). 11/12/17   Nance Pear, MD  esomeprazole (NEXIUM) 40 MG capsule Take 40 mg by mouth daily.  10/31/16   [provider]  fluticasone (FLONASE) 50 MCG/ACT nasal spray Place into the nose. 12/17/11  [provider]  HYDROcodone-acetaminophen (NORCO) 5-325 MG tablet Take 1 tablet by mouth every 6 (six) hours as needed for moderate pain. Patient not taking: Reported on 03/13/2021 08/26/15   Daymon Larsen, MD  ipratropium-albuterol (DUONEB) 0.5-2.5 (3) MG/3ML SOLN Take 3 mLs by nebulization every 6 (six) hours as needed (shortness of breath). 11/24/18   Kendell Bane, NP  metoCLOPramide (REGLAN) 10 MG tablet Take 10 mg by mouth every 6 (six) hours as needed for nausea or vomiting.     [provider]  montelukast (SINGULAIR) 10 MG tablet Take 10 mg by mouth at bedtime.  02/26/14   [provider]  ondansetron (ZOFRAN ODT) 4 MG disintegrating tablet Take 1 tablet (4 mg total) by mouth every 8 (eight) hours as needed. 01/27/21   Lavonia Drafts, MD  ondansetron (ZOFRAN) 4 MG tablet Take 1 tablet (4 mg total) by mouth every 8 (eight) hours as needed for nausea or vomiting. 11/12/17   Nance Pear, MD  prednisoLONE acetate (PRED FORTE) 1 % ophthalmic suspension SHAKE LQ AND INT 1 GTT IN OS TID Patient not taking: Reported on 03/13/2021 04/16/18   [provider]  predniSONE (DELTASONE) 10 MG tablet Take 1 tablet 3 times a day with a meal for 2 days, take 1 tablet 2 times a day for 2 days, take 1 tablet once daily for 2  days. Patient not taking: Reported on 03/13/2021 06/08/20   Luiz Ochoa, NP  PROVENTIL HFA [id number] Base) MCG/ACT inhaler INHALE 2 PUFFS INTO THE LUNGS EVERY 4 HOURS AS NEEDED FOR WHEEZING OR SHORTNESS OF BREATH 05/01/20   Allyne Gee, MD  ranitidine (ZANTAC) 300 MG tablet Take 150 mg by mouth daily.  Patient not taking: Reported on 03/13/2021 12/27/16   [provider]  sulfamethoxazole-trimethoprim (BACTRIM) 400-80 MG tablet Take 1 tablet by mouth 2 (two) times daily. Patient not taking: Reported on 03/13/2021 06/08/20   Luiz Ochoa, NP  tiotropium (SPIRIVA HANDIHALER) 18 MCG inhalation capsule Place 1 capsule (18 mcg total) into inhaler and inhale 1 day or 1 dose. Patient not taking: Reported on 03/13/2021 11/11/19   Kendell Bane, NP  traZODone (DESYREL) 100 MG tablet Take 200 mg by mouth at bedtime as needed for sleep.  10/20/14   [provider]      Labs on Admission: I have personally reviewed following labs and imaging studies  CBC: Recent Labs  Lab 08/23/21 1308 08/24/21 0006  WBC 7.1 6.7  NEUTROABS 4.0 3.7  HGB 16.0* 14.9  HCT 45.7 43.9  MCV 89.3 91.8  PLT 214 563   Basic Metabolic Panel: Recent Labs  Lab 08/23/21 1308 08/24/21 0006  NA 126* 132*  K 4.0 4.1  CL 91* 97*  CO2 29 31  GLUCOSE 103* 114*  BUN 13 12  CREATININE 0.47 0.52  CALCIUM 9.7 9.5   GFR: Estimated Creatinine Clearance: 61.3 mL/min (by C-G formula based on SCr of 0.52 mg/dL). Liver Function Tests: Recent Labs  Lab 08/23/21 1308  AST 19  ALT 19  ALKPHOS 49  BILITOT 0.7  PROT 6.9  ALBUMIN 3.8   Recent Labs  Lab 08/23/21 1308  LIPASE 110*   No results for input(s): AMMONIA in the last 168 hours. Coagulation Profile: No results for input(s): INR, PROTIME in the last 168 hours. Cardiac Enzymes: No results for input(s): CKTOTAL, CKMB, CKMBINDEX, TROPONINI in the last 168 hours. BNP (last 3 results) No results for input(s): PROBNP in the last 8760  hours. HbA1C: No results for input(s): HGBA1C in the last 72 hours. CBG: No results for input(s): GLUCAP in the last 168 hours. Lipid Profile: No results for input(s): CHOL, HDL, LDLCALC, TRIG, CHOLHDL, LDLDIRECT in the last 72 hours. Thyroid Function Tests: No results for input(s): TSH, T4TOTAL, FREET4, T3FREE, THYROIDAB in the last 72 hours. Anemia Panel: No results for input(s): VITAMINB12, FOLATE, FERRITIN, TIBC, IRON, RETICCTPCT in the last 72 hours. Urine analysis:    Component Value Date/Time   COLORURINE YELLOW 08/24/2021 0006   APPEARANCEUR CLEAR 08/24/2021 0006   APPEARANCEUR Clear 06/11/2014 1221   LABSPEC <1.005 (L) 08/24/2021 0006   LABSPEC 1.002 06/11/2014 1221   PHURINE 6.0 08/24/2021 0006   GLUCOSEU NEGATIVE 08/24/2021 0006   GLUCOSEU Negative 06/11/2014 1221   HGBUR MODERATE (A) 08/24/2021 0006   BILIRUBINUR NEGATIVE 08/24/2021 0006   BILIRUBINUR Negative 06/11/2014 1221   KETONESUR NEGATIVE 08/24/2021 0006   PROTEINUR NEGATIVE 08/24/2021 0006   NITRITE NEGATIVE 08/24/2021 0006   LEUKOCYTESUR SMALL (A) 08/24/2021 0006   LEUKOCYTESUR Negative 06/11/2014 1221    Radiological Exams on Admission: DG Chest 2 View  Result Date: 08/23/2021 CLINICAL DATA:  Shortness of breath EXAM: CHEST - 2 VIEW COMPARISON:  05/08/2021 FINDINGS: The heart size and mediastinal contours are within normal limits. Aortic atherosclerosis. Both lungs are clear. The visualized skeletal structures are unremarkable. IMPRESSION: No active cardiopulmonary disease. Electronically Signed   By: Donavan Foil M.D.   On: 08/23/2021 23:58   CT ABDOMEN PELVIS W CONTRAST  Result Date: 08/23/2021 CLINICAL DATA:  Acute nonlocalized abdominal pain in an 65 year old female. EXAM: CT ABDOMEN AND PELVIS WITH CONTRAST TECHNIQUE: Multidetector CT imaging of the abdomen and pelvis was performed using the standard protocol following bolus administration of intravenous contrast. CONTRAST:  181mL OMNIPAQUE IOHEXOL  300 MG/ML  SOLN COMPARISON:  Comparison is made with May 21, 2013. FINDINGS: Lower chest: Minimal basilar atelectasis, no effusion. No consolidation. Hepatobiliary: Post cholecystectomy with stable hepatic cyst near the dome of the RIGHT hemi liver. Mild biliary duct distension within expected range for cholecystectomy changes. Pancreas: Normal, without mass, inflammation or ductal dilatation. Spleen: Spleen normal size and contour. Adrenals/Urinary Tract: Adrenal glands are unremarkable. Symmetric renal enhancement. No sign of hydronephrosis. No suspicious renal lesion or perinephric stranding. Urinary bladder is grossly unremarkable. . Signs of renal cortical scarring. Nephrolithiasis on the RIGHT with a 4 mm calculus. Renal cysts are present bilaterally. Urinary bladder is moderately distended extending into the low abdomen/upper pelvis. Stomach/Bowel: Postoperative changes about the GE junction. This has the appearance of Nissen fundoplication and has occurred since the prior imaging evaluation. No small bowel wall thickening or stranding adjacent to the small bowel. No signs of small bowel obstruction. Scattered fluid-filled loops of small bowel and colon are present throughout the abdomen. The sigmoid is redundant but nondilated. The appendix is not visualized, no secondary signs to suggest acute appendicitis. Vascular/Lymphatic: Aortic atherosclerosis. No sign of aneurysm. Smooth contour of the IVC. There is no gastrohepatic or hepatoduodenal ligament lymphadenopathy. No retroperitoneal or mesenteric lymphadenopathy. No pelvic sidewall lymphadenopathy. Reproductive: Unremarkable by CT.  No adnexal masses. Other: No free air.  No ascites. Musculoskeletal: No acute bone finding. No destructive bone process. Spinal degenerative changes. IMPRESSION: Scattered fluid-filled loops of small bowel and colon are present throughout the abdomen. Findings can be seen in the setting of gastroenteritis. No signs of  fecal impaction. Distension of the urinary bladder which is moderately distended extending to the L5 level on sagittal images. Correlate  with any urinary retention or signs of bladder outlet obstruction. Interval changes of Nissen fundoplication. Nonobstructive RIGHT nephrolithiasis. Aortic atherosclerosis. Aortic Atherosclerosis (ICD10-I70.0). Electronically Signed   By: Zetta Bills M.D.   On: 08/23/2021 14:16       Athena Masse MD [facility]   08/24/2021, 2:45 AM

## 2021-08-24 NOTE — ED Provider Notes (Signed)
[facility] Emergency Department Provider Note  ____________________________________________   Event Date/Time   First MD Initiated Contact with Patient 08/23/21 2345     (approximate)  I have reviewed the triage vital signs and the nursing notes.   HISTORY  Chief Complaint No chief complaint on file.    HPI Sylvia Richardson is a 65 y.o. female with history of COPD, hypertension, hyperlipidemia who does not wear oxygen at home who presents to the emergency department complaints of shortness of breath that started today along with chest tightness.  Has had productive cough.  No fever.  No lower extremity swelling or pain.  Denies history of CHF, PE or DVT.  Was here earlier in the emergency department complaints of abdominal pain and nausea.  Was found to have constipation and states she has not had a bowel movement in more than a week.  She has been passing gas.  Due to her constipation she was found to have urinary retention and a Foley catheter was placed.  She is requesting that the Foley catheter be removed because she is leaking urine around the catheter.  He states her abdomen still feels sore but she is feeling better and has been able to have a bowel movement.  Still having some nausea.       Past Medical History:  Diagnosis Date   Bipolar disorder ([place])    Cancer ([place])    skin   Carotid artery disease ([place])    COPD (chronic obstructive pulmonary disease) ([place])    H/O degenerative disc disease    Hypercholesteremia    Hypertension    Stroke [facility])     Patient Active Problem List   Diagnosis Date Noted   COPD with acute exacerbation ([place]) 08/24/2021   Hypoxia 08/24/2021   Acute urinary retention 08/24/2021   Constipation 08/24/2021   Bipolar disorder ([place])    Hx of nonmelanoma skin cancer 03/13/2021   Anxiety, generalized 12/13/2020   Recurrent cold sores 11/02/2020   Age-related osteoporosis without current pathological fracture  03/19/2019   PAD (peripheral artery disease) ([place]) 90/24/0973   Lichen sclerosus 53/29/9242   Bilateral carotid artery stenosis 02/14/2017   Calculus of kidney 01/23/2016   History of TIAs 06/20/2015   Social phobia 02/16/2015   Headache disorder 12/15/2014   Renal infarct ([place]) 11/18/2014   SUI (stress urinary incontinence, female) 11/18/2014   Arthropathy 11/17/2014   Gastro-esophageal reflux disease without esophagitis 11/17/2014   Malignant neoplasm ([place]) 11/17/2014   Candidiasis of perineum 03/23/2014   Incomplete emptying of bladder 03/23/2014   Pure hyperglyceridemia 12/02/2013   Hyponatremia 12/02/2013   Hyposmolality and/or hyponatremia 12/02/2013   Bladder pain 11/18/2013   Urge incontinence 11/18/2013   Microscopic hematuria 06/30/2013   Urinary incontinence without sensory awareness 06/30/2013   Anxiety state 02/20/2013   Chronic pain 02/20/2013   Localized osteoarthrosis, lower leg 02/20/2013   Low back pain 02/20/2013   Other depressive disorder 02/20/2013   Pain in joint involving lower leg 02/20/2013   Essential hypertension 06/15/2010   Atopic dermatitis and related condition 03/21/2010   Chronic obstructive pulmonary disease, unspecified ([place]) 03/21/2010   Nicotine dependence, unspecified, uncomplicated 68/34/1962   Malaise and fatigue 01/03/2010   Mixed emotional features as adjustment reaction 01/03/2010   Impaired fasting glucose 11/28/2009   Obstructive chronic bronchitis ([place]) 09/07/2009   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 Authorizing Provider  aspirin EC 81 MG tablet Take 81 mg by mouth daily.  10/02/10   [provider]  atenolol (TENORMIN) 100 MG tablet take 1/2 to 1 tablet by mouth once daily 01/17/17   [provider]  busPIRone (BUSPAR) 15 MG tablet Take 15 mg by mouth daily.  01/06/17   [provider]  clonazePAM (KLONOPIN) 1 MG tablet Take 1 mg by mouth daily as needed for anxiety.     [provider]  CYMBALTA 60 MG capsule Take 120 mg by mouth daily.     [provider]  DALIRESP 500 MCG TABS tablet TAKE 1 TABLET BY MOUTH EVERY DAY(PLEASE KEEP APPT. IN [place]) 02/28/21   Allyne Gee, MD  dicyclomine (BENTYL) 20 MG tablet Take 1 tablet (20 mg total) by mouth 3 (three) times daily as needed (abdominal pain). 11/12/17   Nance Pear, MD  esomeprazole (NEXIUM) 40 MG capsule Take 40 mg by mouth daily.  10/31/16   [provider]  fluticasone (FLONASE) 50 MCG/ACT nasal spray Place into the nose. 12/17/11   [provider]  HYDROcodone-acetaminophen (NORCO) 5-325 MG tablet Take 1 tablet by mouth every 6 (six) hours as needed for moderate pain. Patient not taking: Reported on 03/13/2021 08/26/15   Daymon Larsen, MD  ipratropium-albuterol (DUONEB) 0.5-2.5 (3) MG/3ML SOLN Take 3 mLs by nebulization every 6 (six) hours as needed (shortness of breath). 11/24/18   Kendell Bane, NP  metoCLOPramide (REGLAN) 10 MG tablet Take 10 mg by mouth every 6 (six) hours as needed for nausea or vomiting.     [provider]  montelukast (SINGULAIR) 10 MG tablet Take 10 mg by mouth at bedtime.  02/26/14   [provider]  ondansetron (ZOFRAN ODT) 4 MG disintegrating tablet Take 1 tablet (4 mg total) by mouth every 8 (eight) hours as needed. 01/27/21   Lavonia Drafts, MD  ondansetron (ZOFRAN) 4 MG tablet Take 1 tablet (4 mg total) by mouth every 8 (eight) hours as needed for nausea or vomiting. 11/12/17   Nance Pear, MD   prednisoLONE acetate (PRED FORTE) 1 % ophthalmic suspension SHAKE LQ AND INT 1 GTT IN OS TID Patient not taking: Reported on 03/13/2021 04/16/18   [provider]  predniSONE (DELTASONE) 10 MG tablet Take 1 tablet 3 times a day with a meal for 2 days, take 1 tablet 2 times a day for 2 days, take 1 tablet once daily for 2 days. Patient not taking: Reported on 03/13/2021 06/08/20   Luiz Ochoa, NP  PROVENTIL HFA 108 [phone] Base) MCG/ACT inhaler INHALE 2 PUFFS INTO THE LUNGS EVERY 4 HOURS AS NEEDED FOR WHEEZING OR SHORTNESS OF BREATH 05/01/20   Allyne Gee, MD  ranitidine (ZANTAC) 300 MG tablet Take 150 mg by mouth daily.  Patient not taking: Reported on 03/13/2021 12/27/16   [provider]  sulfamethoxazole-trimethoprim (BACTRIM) 400-80 MG tablet Take 1 tablet by mouth 2 (two) times daily. Patient not taking: Reported on 03/13/2021 06/08/20   Luiz Ochoa, NP  tiotropium (SPIRIVA HANDIHALER) 18 MCG inhalation capsule Place 1  capsule (18 mcg total) into inhaler and inhale 1 day or 1 dose. Patient not taking: Reported on 03/13/2021 11/11/19   Kendell Bane, NP  traZODone (DESYREL) 100 MG tablet Take 200 mg by mouth at bedtime as needed for sleep.  10/20/14   [provider]    Allergies Bactrim [sulfamethoxazole-trimethoprim], Ciprofloxacin, Clindamycin/lincomycin, Zithromax [azithromycin], Choline fenofibrate, Doxycycline, Fish oil, Levaquin [levofloxacin], Niacin, Nifedipine, Penicillins, and Rosuvastatin  Family History  Problem Relation Age of Onset   Prostate cancer Father    Stroke Maternal Grandfather    Stroke Paternal Grandfather    Breast cancer Neg Hx     Social History Social History   Tobacco Use   Smoking status: Every Day   Smokeless tobacco: Never   Tobacco comments:    10 cigarettes a day   Vaping Use   Vaping Use: Never used  Substance Use Topics   Alcohol use: No   Drug use: No    Review of Systems Constitutional: No fever. Eyes: No  visual changes. ENT: No sore throat. Cardiovascular: Denies chest pain. Respiratory: +shortness of breath. Gastrointestinal: No nausea, vomiting, diarrhea. Genitourinary: Negative for dysuria. Musculoskeletal: Negative for back pain. Skin: Negative for rash. Neurological: Negative for focal weakness or numbness.  ____________________________________________   PHYSICAL EXAM:  VITAL SIGNS: ED Triage Vitals  Enc Vitals Group     BP 08/23/21 2332 97/60     Pulse Rate 08/23/21 2332 76     Resp 08/23/21 2332 18     Temp 08/23/21 2332 97.8 F (36.6 C)     Temp Source 08/23/21 2332 Oral     SpO2 08/23/21 2332 93 %     Weight 08/23/21 2329 133 lb 13.1 oz (60.7 kg)     Height 08/23/21 2329 5\' 4"  (1.626 m)     Head Circumference --      Peak Flow --      Pain Score 08/23/21 2328 10     Pain Loc --      Pain Edu? --      Excl. in [place]? --    CONSTITUTIONAL: Alert and oriented and responds appropriately to questions.  Chronically ill-appearing HEAD: Normocephalic EYES: Conjunctivae clear, pupils appear equal, EOM appear intact ENT: normal nose; moist mucous membranes NECK: Supple, normal ROM CARD: RRR; S1 and S2 appreciated; no murmurs, no clicks, no rubs, no gallops RESP: Patient was satting 88% on room air at rest.  Doing well on 2 L nasal cannula.  Diffuse rhonchorous breath sounds and expiratory wheezing.  No rales.  No respiratory distress.  Speaking full sentences. ABD/GI: Normal bowel sounds; non-distended; soft, minimally tender to palpation diffusely, no rebound, no guarding, no peritoneal signs, no hepatosplenomegaly BACK: The back appears normal EXT: Normal ROM in all joints; no deformity noted, no edema; no cyanosis, no calf tenderness or calf swelling SKIN: Normal color for age and race; warm; no rash on exposed skin NEURO: Moves all extremities equally PSYCH: The patient's mood and manner are appropriate.  ____________________________________________   LABS (all labs  ordered are listed, but only abnormal results are displayed)  Labs Reviewed  BASIC METABOLIC PANEL - Abnormal; Notable for the following components:      Result Value   Sodium 132 (*)    Chloride 97 (*)    Glucose, Bld 114 (*)    Anion gap 4 (*)    All other components within normal limits  URINALYSIS, COMPLETE (UACMP) WITH MICROSCOPIC - Abnormal; Notable for the following components:  Specific Gravity, Urine <1.005 (*)    Hgb urine dipstick MODERATE (*)    Leukocytes,Ua SMALL (*)    All other components within normal limits  RESP PANEL BY RT-PCR (FLU A&B, COVID) ARPGX2  URINE CULTURE  EXPECTORATED SPUTUM ASSESSMENT W GRAM STAIN, RFLX TO RESP C  CBC WITH DIFFERENTIAL/PLATELET  HIV ANTIBODY (ROUTINE TESTING W REFLEX)  TROPONIN I (HIGH SENSITIVITY)   ____________________________________________  EKG   Date: 08/24/2021 01:01  Rate: 74  Rhythm: normal sinus rhythm  QRS Axis: normal  Intervals: normal  ST/T Wave abnormalities: normal  Conduction Disutrbances: none  Narrative Interpretation: unremarkable      ____________________________________________  RADIOLOGY I, Akeyla Molden, personally viewed and evaluated these images (plain radiographs) as part of my medical decision making, as well as reviewing the written report by the radiologist.  ED MD interpretation: Chest x-ray shows no acute abnormality.  Official radiology report(s): DG Chest 2 View  Result Date: 08/23/2021 CLINICAL DATA:  Shortness of breath EXAM: CHEST - 2 VIEW COMPARISON:  05/08/2021 FINDINGS: The heart size and mediastinal contours are within normal limits. Aortic atherosclerosis. Both lungs are clear. The visualized skeletal structures are unremarkable. IMPRESSION: No active cardiopulmonary disease. Electronically Signed   By: Donavan Foil M.D.   On: 08/23/2021 23:58   CT ABDOMEN PELVIS W CONTRAST  Result Date: 08/23/2021 CLINICAL DATA:  Acute nonlocalized abdominal pain in an 65 year old female.  EXAM: CT ABDOMEN AND PELVIS WITH CONTRAST TECHNIQUE: Multidetector CT imaging of the abdomen and pelvis was performed using the standard protocol following bolus administration of intravenous contrast. CONTRAST:  131mL OMNIPAQUE IOHEXOL 300 MG/ML  SOLN COMPARISON:  Comparison is made with May 21, 2013. FINDINGS: Lower chest: Minimal basilar atelectasis, no effusion. No consolidation. Hepatobiliary: Post cholecystectomy with stable hepatic cyst near the dome of the RIGHT hemi liver. Mild biliary duct distension within expected range for cholecystectomy changes. Pancreas: Normal, without mass, inflammation or ductal dilatation. Spleen: Spleen normal size and contour. Adrenals/Urinary Tract: Adrenal glands are unremarkable. Symmetric renal enhancement. No sign of hydronephrosis. No suspicious renal lesion or perinephric stranding. Urinary bladder is grossly unremarkable. . Signs of renal cortical scarring. Nephrolithiasis on the RIGHT with a 4 mm calculus. Renal cysts are present bilaterally. Urinary bladder is moderately distended extending into the low abdomen/upper pelvis. Stomach/Bowel: Postoperative changes about the GE junction. This has the appearance of Nissen fundoplication and has occurred since the prior imaging evaluation. No small bowel wall thickening or stranding adjacent to the small bowel. No signs of small bowel obstruction. Scattered fluid-filled loops of small bowel and colon are present throughout the abdomen. The sigmoid is redundant but nondilated. The appendix is not visualized, no secondary signs to suggest acute appendicitis. Vascular/Lymphatic: Aortic atherosclerosis. No sign of aneurysm. Smooth contour of the IVC. There is no gastrohepatic or hepatoduodenal ligament lymphadenopathy. No retroperitoneal or mesenteric lymphadenopathy. No pelvic sidewall lymphadenopathy. Reproductive: Unremarkable by CT.  No adnexal masses. Other: No free air.  No ascites. Musculoskeletal: No acute bone  finding. No destructive bone process. Spinal degenerative changes. IMPRESSION: Scattered fluid-filled loops of small bowel and colon are present throughout the abdomen. Findings can be seen in the setting of gastroenteritis. No signs of fecal impaction. Distension of the urinary bladder which is moderately distended extending to the L5 level on sagittal images. Correlate with any urinary retention or signs of bladder outlet obstruction. Interval changes of Nissen fundoplication. Nonobstructive RIGHT nephrolithiasis. Aortic atherosclerosis. Aortic Atherosclerosis (ICD10-I70.0). Electronically Signed   By: Jewel Baize.D.  On: 08/23/2021 14:16    ____________________________________________   PROCEDURES  Procedure(s) performed (including Critical Care):  Procedures  CRITICAL CARE Performed by: Cyril Mourning Nikia Mangino   Total critical care time: 45 minutes  Critical care time was exclusive of separately billable procedures and treating other patients.  Critical care was necessary to treat or prevent imminent or life-threatening deterioration.  Critical care was time spent personally by me on the following activities: development of treatment plan with patient and/or surrogate as well as nursing, discussions with consultants, evaluation of patient's response to treatment, examination of patient, obtaining history from patient or surrogate, ordering and performing treatments and interventions, ordering and review of laboratory studies, ordering and review of radiographic studies, pulse oximetry and re-evaluation of patient's condition.  ____________________________________________   INITIAL IMPRESSION / ASSESSMENT AND PLAN / ED COURSE  As part of my medical decision making, I reviewed the following data within the [facility] notes reviewed and incorporated, Labs reviewed , EKG interpreted , Old EKG reviewed, Old chart reviewed, Radiograph reviewed , Discussed with admitting  physician , and Notes from prior ED visits         Patient here with shortness of breath, wheezing, cough, chest pain and new hypoxia.  States she was previously on oxygen but has not required it in months so she sent it back.  She is not normally hypoxic on room air.  Was satting 88% on arrival here.  Doing well on 2 L nasal cannula.  Suspect COPD exacerbation.  Differential also includes CHF, PE, pneumonia, COVID, influenza.  Will give breathing treatments, Solu-Medrol.  Chest pain seems atypical for ACS.  EKG shows no new ischemic change compared to previous.  Still having some abdominal pain and nausea from constipation.  Abdominal exam is relatively benign.  I did review her CT imaging that was done in the morning and shows no sign of obstruction.  She reports she has had a bowel movement.  She has not vomiting.  Will give Bentyl, Zofran for symptomatic relief.  She also had a Foley catheter placed during her recent visit for urinary retention thought secondary to constipation.  She states she is leaking around this catheter and is asking it be removed.  We discussed that we can remove the catheter but if she developed urinary retention again that this will need to be replaced.  She verbalized understanding.  We will obtain urinalysis but urine earlier did not show any sign of infection.  ED PROGRESS  Patient's chest x-ray is clear.  Troponin is negative.  COVID and flu negative.  Patient has not had any recurrent urinary retention.  Urine shows no sign of infection.  Will discuss with hospitalist for admission for COPD exacerbation with new onset oxygen requirement. ____________________________________________   FINAL CLINICAL IMPRESSION(S) / ED DIAGNOSES  Final diagnoses:  COPD with acute exacerbation ([place])  Acute respiratory failure with hypoxia ([facility])  Constipation, unspecified constipation type     ED Discharge Orders     None       *Please note:  Mashawn Brazil  was evaluated in Emergency Department on 08/24/2021 for the symptoms described in the history of present illness. She was evaluated in the context of the global COVID-19 pandemic, which necessitated consideration that the patient might be at risk for infection with the SARS-CoV-2 virus that causes COVID-19. Institutional protocols and algorithms that pertain to the evaluation of patients at risk for COVID-19 are in a state of rapid change based on  information released by regulatory bodies including the CDC and federal and state organizations. These policies and algorithms were followed during the patient's care in the ED.  Some ED evaluations and interventions may be delayed as a result of limited staffing during and the pandemic.*   Note:  This document was prepared using Dragon voice recognition software and may include unintentional dictation errors.    Loraine Bhullar, Delice Bison, DO 08/24/21 [phone]

## 2021-08-24 NOTE — Progress Notes (Signed)
Sylvia Richardson to be D/C'd Home per MD order.  Discussed prescriptions and follow up appointments with the patient. Prescriptions were sent to patients' pharmacy, medication list explained in detail. Pt verbalized understanding.  Allergies as of 08/24/2021       Reactions   Bactrim [sulfamethoxazole-trimethoprim] Diarrhea   Ciprofloxacin Anaphylaxis   Clindamycin/lincomycin Nausea And Vomiting   Zithromax [azithromycin]    Abdomen pain and acid reflux    Choline Fenofibrate    Other reaction(s): Other (See Comments) Other Reaction: LEFT SIDE TONGUE SWELLING Other reaction(s): Other (See Comments) LEFT SIDE TONGUE SWELLING Other Reaction: LEFT SIDE TONGUE SWELLING   Doxycycline Nausea Only   Fish Oil Nausea Only   Levaquin [levofloxacin] Rash   Niacin Diarrhea   Nifedipine Diarrhea   Rosuvastatin    Other reaction(s): Headache        Medication List     TAKE these medications    aspirin EC 81 MG tablet Take 81 mg by mouth daily.   atenolol 100 MG tablet Commonly known as: TENORMIN Take 100 mg by mouth daily.   clonazePAM 0.5 MG tablet Commonly known as: KLONOPIN Take 0.5 mg by mouth 4 (four) times daily as needed for anxiety.   Daliresp 500 MCG Tabs tablet Generic drug: roflumilast TAKE 1 TABLET BY MOUTH EVERY DAY(PLEASE KEEP APPT. IN [place])   DULoxetine 60 MG capsule Commonly known as: CYMBALTA Take 120 mg by mouth daily.   esomeprazole 20 MG capsule Commonly known as: NEXIUM Take 20 mg by mouth 2 (two) times daily.   hydrochlorothiazide 25 MG tablet Commonly known as: HYDRODIURIL Take 25 mg by mouth daily.   metoCLOPramide 10 MG tablet Commonly known as: REGLAN Take 10 mg by mouth in the morning, at noon, in the evening, and at bedtime.   montelukast 10 MG tablet Commonly known as: SINGULAIR Take 10 mg by mouth at bedtime.   ondansetron 4 MG disintegrating tablet Commonly known as: Zofran ODT Take 1 tablet (4 mg total) by mouth every 8  (eight) hours as needed.   predniSONE 10 MG tablet Commonly known as: DELTASONE Take 10 mg by mouth daily. What changed: Another medication with the same name was added. Make sure you understand how and when to take each.   predniSONE 20 MG tablet Commonly known as: DELTASONE Take 2 tablets (40 mg total) by mouth daily with breakfast. Start taking on: August 25, 2021 What changed: You were already taking a medication with the same name, and this prescription was added. Make sure you understand how and when to take each.   Proventil HFA 108 (90 Base) MCG/ACT inhaler Generic drug: albuterol INHALE 2 PUFFS INTO THE LUNGS EVERY 4 HOURS AS NEEDED FOR WHEEZING OR SHORTNESS OF BREATH   traZODone 100 MG tablet Commonly known as: DESYREL Take 100-200 mg by mouth at bedtime.        Vitals:   08/24/21 0805 08/24/21 1123  BP: 114/69 (!) 111/54  Pulse: 99 81  Resp: 18 18  Temp: 98.8 F (37.1 C) 98.6 F (37 C)  SpO2: 93% 100%    Skin clean, dry and intact without evidence of skin break down, no evidence of skin tears noted. IV catheter discontinued intact. Site without signs and symptoms of complications. Dressing and pressure applied. Pt denies pain at this time. No complaints noted.  An After Visit Summary was printed and given to the patient. Patient escorted via [place], and D/C home via private auto.  Sylvia Richardson

## 2021-08-24 NOTE — Discharge Summary (Signed)
Sylvia Richardson JEH:[id number] DOB: 01/04/1956 DOA: 08/23/2021  PCP: Barbaraann Boys, MD  Admit date: 08/23/2021 Discharge date: 08/24/2021  Time spent: 45 minutes  Recommendations for Outpatient Follow-up:  Pulmonology f/u later this month as scheduled     Discharge Diagnoses:  Principal Problem:   COPD with acute exacerbation [facility]) Active Problems:   Essential hypertension   Hypoxia   Acute urinary retention   Constipation   Bipolar disorder [facility])   Discharge Condition: good  Diet recommendation: heart healthy  Filed Weights   08/23/21 2329  Weight: 60.7 kg    History of present illness:  Sylvia Richardson is a 65 y.o. female with medical history significant for Hypertension, COPD previously on oxygen, bipolar disorder, history of TIA, PAD, and urge incontinence who was seen and discharged from the ED several hours prior with Foley catheter when she presented with constipation and acute urinary retention, who returns to the ED with a complaint of leaking around the Foley catheter but also with a complaint of shortness of breath.  Patient stated that she has had a cough for the past several months that got worse over the past 3 weeks and then since she started feeling short of breath over the past few days.  She was short of breath when she presented to the ED earlier for the urinary retention but it started bothering her more when she got home.  She was previously on oxygen but did well without it and during the unit back in several months ago.  She has a cough that is productive of tenacious white phlegm.  She denies fever or chills and denies chest pain.  Denies nausea, vomiting, abdominal pain, diarrhea.  Does have problems with constipation and received treatment for it earlier and did have a bowel movement on her arrival home.  Denies other complaints   ED course: On arrival, vitals within normal limits and O2 sats within normal limits initially but falling to as  low as 88% requiring 2 L to maintain sats in the mid 90s Blood work for the most part unremarkable   Chest x-ray no active cardiopulmonary disease CT abdomen and pelvis (done earlier in the ED) nonacute.  Signs of possible bladder outlet obstruction.  No signs of fecal impaction   Due to patient complaints of leaking around the Foley catheter, catheter was removed in the ED.  Patient was treated with duo nebs and Solu-Medrol.  Hospitalist consulted for admission.   Hospital Course:  Patient admitted for copd exacerbation. Patient reports productive cough for 1 month and increased dyspnea though she reports all these symptoms as mild. O2 in Ed reportedly 88%. Later that day o2 96% on room air, breathing comfortably. Will treat for mild copd exacerbation with prednisone burst, advising pulm f/u later this month as scheduled. Patient was seen in the ED shortly prior to arrival diagnosed with urinary retention and discharged. Patient reports no problems with urinary retention (CT had shown distended bladder and bladder scan in ED showed 500) so was discharged with foley. She returned later the same day with the above copd symptoms. She reported pain from foley so it was discontinued. Patient urinated normally and PVR was 0 ml so foley was not placed. Urinalysis equivocal, pt reports discomfort from foley, will f/u culture and treat if positive.   Procedures: none   Consultations: none  Discharge Exam: Vitals:   08/24/21 0805 08/24/21 1123  BP: 114/69 (!) 111/54  Pulse: 99 81  Resp: 18 18  Temp: 98.8 F (37.1 C) 98.6 F (37 C)  SpO2: 93% 100%    General: NAD Cardiovascular: RRR Respiratory: scattered rhonchi, no wheeze Abdomen: soft, non-tender  Discharge Instructions   Discharge Instructions     Diet - low sodium heart healthy   Complete by: As directed    Increase activity slowly   Complete by: As directed       Allergies as of 08/24/2021       Reactions   Bactrim  [sulfamethoxazole-trimethoprim] Diarrhea   Ciprofloxacin Anaphylaxis   Clindamycin/lincomycin Nausea And Vomiting   Zithromax [azithromycin]    Abdomen pain and acid reflux    Choline Fenofibrate    Other reaction(s): Other (See Comments) Other Reaction: LEFT SIDE TONGUE SWELLING Other reaction(s): Other (See Comments) LEFT SIDE TONGUE SWELLING Other Reaction: LEFT SIDE TONGUE SWELLING   Doxycycline Nausea Only   Fish Oil Nausea Only   Levaquin [levofloxacin] Rash   Niacin Diarrhea   Nifedipine Diarrhea   Rosuvastatin    Other reaction(s): Headache        Medication List     TAKE these medications    aspirin EC 81 MG tablet Take 81 mg by mouth daily.   atenolol 100 MG tablet Commonly known as: TENORMIN Take 100 mg by mouth daily.   clonazePAM 0.5 MG tablet Commonly known as: KLONOPIN Take 0.5 mg by mouth 4 (four) times daily as needed for anxiety.   Daliresp 500 MCG Tabs tablet Generic drug: roflumilast TAKE 1 TABLET BY MOUTH EVERY DAY(PLEASE KEEP APPT. IN [place])   DULoxetine 60 MG capsule Commonly known as: CYMBALTA Take 120 mg by mouth daily.   esomeprazole 20 MG capsule Commonly known as: NEXIUM Take 20 mg by mouth 2 (two) times daily.   hydrochlorothiazide 25 MG tablet Commonly known as: HYDRODIURIL Take 25 mg by mouth daily.   metoCLOPramide 10 MG tablet Commonly known as: REGLAN Take 10 mg by mouth in the morning, at noon, in the evening, and at bedtime.   montelukast 10 MG tablet Commonly known as: SINGULAIR Take 10 mg by mouth at bedtime.   ondansetron 4 MG disintegrating tablet Commonly known as: Zofran ODT Take 1 tablet (4 mg total) by mouth every 8 (eight) hours as needed.   predniSONE 10 MG tablet Commonly known as: DELTASONE Take 10 mg by mouth daily. What changed: Another medication with the same name was added. Make sure you understand how and when to take each.   predniSONE 20 MG tablet Commonly known as: DELTASONE Take 2 tablets  (40 mg total) by mouth daily with breakfast. Start taking on: August 25, 2021 What changed: You were already taking a medication with the same name, and this prescription was added. Make sure you understand how and when to take each.   Proventil HFA 108 (90 Base) MCG/ACT inhaler Generic drug: albuterol INHALE 2 PUFFS INTO THE LUNGS EVERY 4 HOURS AS NEEDED FOR WHEEZING OR SHORTNESS OF BREATH   traZODone 100 MG tablet Commonly known as: DESYREL Take 100-200 mg by mouth at bedtime.       Allergies  Allergen Reactions   Bactrim [Sulfamethoxazole-Trimethoprim] Diarrhea   Ciprofloxacin Anaphylaxis   Clindamycin/Lincomycin Nausea And Vomiting   Zithromax [Azithromycin]     Abdomen pain and acid reflux    Choline Fenofibrate     Other reaction(s): Other (See Comments) Other Reaction: LEFT SIDE TONGUE SWELLING Other reaction(s): Other (See Comments) LEFT SIDE TONGUE SWELLING Other Reaction: LEFT SIDE TONGUE SWELLING   Doxycycline Nausea Only  Fish Oil Nausea Only   Levaquin [Levofloxacin] Rash   Niacin Diarrhea   Nifedipine Diarrhea   Rosuvastatin     Other reaction(s): Headache    Follow-up Information     Ottie Glazier, MD Follow up.   Specialty: Pulmonary Disease Contact information: [place] [address] [phone]                  The results of significant diagnostics from this hospitalization (including imaging, microbiology, ancillary and laboratory) are listed below for reference.    Significant Diagnostic Studies: DG Chest 2 View  Result Date: 08/23/2021 CLINICAL DATA:  Shortness of breath EXAM: CHEST - 2 VIEW COMPARISON:  05/08/2021 FINDINGS: The heart size and mediastinal contours are within normal limits. Aortic atherosclerosis. Both lungs are clear. The visualized skeletal structures are unremarkable. IMPRESSION: No active cardiopulmonary disease. Electronically Signed   By: Donavan Foil M.D.   On: 08/23/2021 23:58   CT  ABDOMEN PELVIS W CONTRAST  Result Date: 08/23/2021 CLINICAL DATA:  Acute nonlocalized abdominal pain in an 65 year old female. EXAM: CT ABDOMEN AND PELVIS WITH CONTRAST TECHNIQUE: Multidetector CT imaging of the abdomen and pelvis was performed using the standard protocol following bolus administration of intravenous contrast. CONTRAST:  146mL OMNIPAQUE IOHEXOL 300 MG/ML  SOLN COMPARISON:  Comparison is made with May 21, 2013. FINDINGS: Lower chest: Minimal basilar atelectasis, no effusion. No consolidation. Hepatobiliary: Post cholecystectomy with stable hepatic cyst near the dome of the RIGHT hemi liver. Mild biliary duct distension within expected range for cholecystectomy changes. Pancreas: Normal, without mass, inflammation or ductal dilatation. Spleen: Spleen normal size and contour. Adrenals/Urinary Tract: Adrenal glands are unremarkable. Symmetric renal enhancement. No sign of hydronephrosis. No suspicious renal lesion or perinephric stranding. Urinary bladder is grossly unremarkable. . Signs of renal cortical scarring. Nephrolithiasis on the RIGHT with a 4 mm calculus. Renal cysts are present bilaterally. Urinary bladder is moderately distended extending into the low abdomen/upper pelvis. Stomach/Bowel: Postoperative changes about the GE junction. This has the appearance of Nissen fundoplication and has occurred since the prior imaging evaluation. No small bowel wall thickening or stranding adjacent to the small bowel. No signs of small bowel obstruction. Scattered fluid-filled loops of small bowel and colon are present throughout the abdomen. The sigmoid is redundant but nondilated. The appendix is not visualized, no secondary signs to suggest acute appendicitis. Vascular/Lymphatic: Aortic atherosclerosis. No sign of aneurysm. Smooth contour of the IVC. There is no gastrohepatic or hepatoduodenal ligament lymphadenopathy. No retroperitoneal or mesenteric lymphadenopathy. No pelvic sidewall  lymphadenopathy. Reproductive: Unremarkable by CT.  No adnexal masses. Other: No free air.  No ascites. Musculoskeletal: No acute bone finding. No destructive bone process. Spinal degenerative changes. IMPRESSION: Scattered fluid-filled loops of small bowel and colon are present throughout the abdomen. Findings can be seen in the setting of gastroenteritis. No signs of fecal impaction. Distension of the urinary bladder which is moderately distended extending to the L5 level on sagittal images. Correlate with any urinary retention or signs of bladder outlet obstruction. Interval changes of Nissen fundoplication. Nonobstructive RIGHT nephrolithiasis. Aortic atherosclerosis. Aortic Atherosclerosis (ICD10-I70.0). Electronically Signed   By: Zetta Bills M.D.   On: 08/23/2021 14:16    Microbiology: Recent Results (from the past 240 hour(s))  Resp Panel by RT-PCR (Flu A&B, Covid) Nasopharyngeal Swab     Status: None   Collection Time: 08/24/21 12:55 AM   Specimen: Nasopharyngeal Swab; Nasopharyngeal(NP) swabs in vial transport medium  Result Value Ref Range Status  SARS Coronavirus 2 by RT PCR NEGATIVE NEGATIVE Final    Comment: (NOTE) SARS-CoV-2 target nucleic acids are NOT DETECTED.  The SARS-CoV-2 RNA is generally detectable in upper respiratory specimens during the acute phase of infection. The lowest concentration of SARS-CoV-2 viral copies this assay can detect is 138 copies/mL. A negative result does not preclude SARS-Cov-2 infection and should not be used as the sole basis for treatment or other patient management decisions. A negative result may occur with  improper specimen collection/handling, submission of specimen other than nasopharyngeal swab, presence of viral mutation(s) within the areas targeted by this assay, and inadequate number of viral copies(<138 copies/mL). A negative result must be combined with clinical observations, patient history, and epidemiological information. The  expected result is Negative.  Fact Sheet for Patients:  EntrepreneurPulse.com.au  Fact Sheet for Healthcare Providers:  IncredibleEmployment.be  This test is no t yet approved or cleared by the [place] FDA and  has been authorized for detection and/or diagnosis of SARS-CoV-2 by FDA under an Emergency Use Authorization (EUA). This EUA will remain  in effect (meaning this test can be used) for the duration of the COVID-19 declaration under Section 564(b)(1) of the Act, 21 U.S.C.section 360bbb-3(b)(1), unless the authorization is terminated  or revoked sooner.       Influenza A by PCR NEGATIVE NEGATIVE Final   Influenza B by PCR NEGATIVE NEGATIVE Final    Comment: (NOTE) The Xpert Xpress SARS-CoV-2/FLU/RSV plus assay is intended as an aid in the diagnosis of influenza from Nasopharyngeal swab specimens and should not be used as a sole basis for treatment. Nasal washings and aspirates are unacceptable for Xpert Xpress SARS-CoV-2/FLU/RSV testing.  Fact Sheet for Patients: EntrepreneurPulse.com.au  Fact Sheet for Healthcare Providers: IncredibleEmployment.be  This test is not yet approved or cleared by the [place] FDA and has been authorized for detection and/or diagnosis of SARS-CoV-2 by FDA under an Emergency Use Authorization (EUA). This EUA will remain in effect (meaning this test can be used) for the duration of the COVID-19 declaration under Section 564(b)(1) of the Act, 21 U.S.C. section 360bbb-3(b)(1), unless the authorization is terminated or revoked.  Performed at [facility], [address] [place], [place] [address]      Labs: Basic Metabolic Panel: Recent Labs  Lab 08/23/21 1308 08/24/21 0006  NA 126* 132*  K 4.0 4.1  CL 91* 97*  CO2 29 31  GLUCOSE 103* 114*  BUN 13 12  CREATININE 0.47 0.52  CALCIUM 9.7 9.5   Liver Function Tests: Recent Labs  Lab  08/23/21 1308  AST 19  ALT 19  ALKPHOS 49  BILITOT 0.7  PROT 6.9  ALBUMIN 3.8   Recent Labs  Lab 08/23/21 1308  LIPASE 110*   No results for input(s): AMMONIA in the last 168 hours. CBC: Recent Labs  Lab 08/23/21 1308 08/24/21 0006  WBC 7.1 6.7  NEUTROABS 4.0 3.7  HGB 16.0* 14.9  HCT 45.7 43.9  MCV 89.3 91.8  PLT 214 204   Cardiac Enzymes: No results for input(s): CKTOTAL, CKMB, CKMBINDEX, TROPONINI in the last 168 hours. BNP: BNP (last 3 results) No results for input(s): BNP in the last 8760 hours.  ProBNP (last 3 results) No results for input(s): PROBNP in the last 8760 hours.  CBG: No results for input(s): GLUCAP in the last 168 hours.     Signed:  Desma Maxim MD.  [facility] 08/24/2021, 11:26 AM

## 2021-08-25 LAB — URINE CULTURE: Culture: NO GROWTH

## 2021-09-03 ENCOUNTER — Ambulatory Visit: Admission: RE | Admit: 2021-09-03 | Payer: Medicaid Other | Source: Ambulatory Visit

## 2021-09-25 ENCOUNTER — Ambulatory Visit: Payer: Self-pay | Admitting: Urology

## 2021-10-23 NOTE — Progress Notes (Signed)
10/24/21 2:51 PM   [name] 1956-08-07 [id number]  Referring provider:  Barbaraann Boys, MD [place],  [place] [address] Chief Complaint  Patient presents with   Nephrolithiasis     HPI: Sylvia Richardson is a 66 y.o.female who presents today for follow-up on ED visit.   She has a personal history of TIA, PAD, and urge incontinence.   She was seen in the ED on 08/23/2021 for shortness of breath. She was seen previously in the ED hours prior for urinary retention and constipation and placed on a foley. Urinalysis showed small Hgb and trace leukocytes. CT abdomen and pelvis visualized Signs of renal cortical scarring. Nephrolithiasis on the RIGHT with a 4 mm calculus. Renal cysts are present bilaterally. Urinary bladder is moderately distended extending into the low abdomen/upper pelvis.  She ended up having a Foley catheter placed for presumed retention although she herself did not feel like she was having difficulty urinating.  She ended up returning to the emergency room the following day with issues with her Foley catheter.  Ended up being removed and spent the night in the hospital.  She was voiding spontaneously prior to discharge.  She reports today that she has been voiding adequately since her catheter was removed. She reports that her bladder discomfort happens when she urinates she is unsure if this is because of a stone episode. She denies any trouble with bladder infections. She denies any blood in her urine.   She reports that she has a bowel movement every day.   She does complain of urinary urgency and urge incontinence that happens on a daily basis.  This seems to be worsening.  Is bothersome to her.  Has never been addressed before.  She is never tried any medications.  She denies any significant stress incontinence.  No vaginal symptoms.  PMH: Past Medical History:  Diagnosis Date   Bipolar disorder ([place])    Cancer ([place])    skin    Carotid artery disease (H[facility])    COPD (chronic obstructive pulmonary disease) ([place])    H/O degenerative disc disease    Hypercholesteremia    Hypertension    Stroke [facility])     Surgical History: Past Surgical History:  Procedure Laterality Date   BREAST BIOPSY Left    core bx- neg   CESAREAN SECTION     CHOLECYSTECTOMY     TONSILLECTOMY     TUBAL LIGATION      Home Medications:  Allergies as of 10/24/2021       Reactions   Bactrim [sulfamethoxazole-trimethoprim] Diarrhea   Ciprofloxacin Anaphylaxis   Clindamycin/lincomycin Nausea And Vomiting   Zithromax [azithromycin]    Abdomen pain and acid reflux    Choline Fenofibrate    Other reaction(s): Other (See Comments) Other Reaction: LEFT SIDE TONGUE SWELLING Other reaction(s): Other (See Comments) LEFT SIDE TONGUE SWELLING Other Reaction: LEFT SIDE TONGUE SWELLING   Doxycycline Nausea Only   Fish Oil Nausea Only   Levaquin [levofloxacin] Rash   Niacin Diarrhea   Nifedipine Diarrhea   Rosuvastatin    Other reaction(s): Headache        Medication List        Accurate as of October 24, 2021  2:51 PM. If you have any questions, ask your nurse or doctor.          STOP taking these medications    ondansetron 4 MG disintegrating tablet Commonly known as: Zofran ODT Stopped by: Hollice Espy, MD  TAKE these medications    aspirin EC 81 MG tablet Take 81 mg by mouth daily.   atenolol 100 MG tablet Commonly known as: TENORMIN Take 100 mg by mouth daily.   clonazePAM 0.5 MG tablet Commonly known as: KLONOPIN Take 0.5 mg by mouth 4 (four) times daily as needed for anxiety.   Daliresp 500 MCG Tabs tablet Generic drug: roflumilast TAKE 1 TABLET BY MOUTH EVERY DAY(PLEASE KEEP APPT. IN [place])   DULoxetine 60 MG capsule Commonly known as: CYMBALTA Take 120 mg by mouth daily.   esomeprazole 20 MG capsule Commonly known as: NEXIUM Take 20 mg by mouth 2 (two) times daily.   hydrochlorothiazide 25 MG  tablet Commonly known as: HYDRODIURIL Take 25 mg by mouth daily.   metoCLOPramide 10 MG tablet Commonly known as: REGLAN Take 10 mg by mouth in the morning, at noon, in the evening, and at bedtime.   montelukast 10 MG tablet Commonly known as: SINGULAIR Take 10 mg by mouth at bedtime.   predniSONE 10 MG tablet Commonly known as: DELTASONE Take 10 mg by mouth daily. What changed: Another medication with the same name was removed. Continue taking this medication, and follow the directions you see here. Changed by: Hollice Espy, MD   Proventil HFA 108 (90 Base) MCG/ACT inhaler Generic drug: albuterol INHALE 2 PUFFS INTO THE LUNGS EVERY 4 HOURS AS NEEDED FOR WHEEZING OR SHORTNESS OF BREATH   traZODone 100 MG tablet Commonly known as: DESYREL Take 100-200 mg by mouth at bedtime.        Allergies:  Allergies  Allergen Reactions   Bactrim [Sulfamethoxazole-Trimethoprim] Diarrhea   Ciprofloxacin Anaphylaxis   Clindamycin/Lincomycin Nausea And Vomiting   Zithromax [Azithromycin]     Abdomen pain and acid reflux    Choline Fenofibrate     Other reaction(s): Other (See Comments) Other Reaction: LEFT SIDE TONGUE SWELLING Other reaction(s): Other (See Comments) LEFT SIDE TONGUE SWELLING Other Reaction: LEFT SIDE TONGUE SWELLING   Doxycycline Nausea Only   Fish Oil Nausea Only   Levaquin [Levofloxacin] Rash   Niacin Diarrhea   Nifedipine Diarrhea   Rosuvastatin     Other reaction(s): Headache    Family History: Family History  Problem Relation Age of Onset   Prostate cancer Father    Stroke Maternal Grandfather    Stroke Paternal Grandfather    Breast cancer Neg Hx     Social History:  reports that she has been smoking. She has never used smokeless tobacco. She reports that she does not drink alcohol and does not use drugs.   Physical Exam: BP (!) 165/78    Pulse 77    Ht 5\' 4"  (1.626 m)    Wt 128 lb (58.1 kg)    BMI 21.97 kg/m   Constitutional:  Alert and  oriented, No acute distress. HEENT: [place] AT, moist mucus membranes.  Trachea midline, no masses. Cardiovascular: No clubbing, cyanosis, or edema. Respiratory: Normal respiratory effort, no increased work of breathing. Skin: No rashes, bruises or suspicious lesions. Neurologic: Grossly intact, no focal deficits, moving all 4 extremities. Psychiatric: Normal mood and affect.  Laboratory Data:  Lab Results  Component Value Date   CREATININE 0.52 08/24/2021   Lab Results  Component Value Date   HGBA1C 5.6 03/19/2012    Urinalysis 6-10 wbc 3-10 RBCs, and many bacteria  Pertinent Imaging: CLINICAL DATA:  Acute nonlocalized abdominal pain in an 66 year old female.   EXAM: CT ABDOMEN AND PELVIS WITH CONTRAST   TECHNIQUE: Multidetector CT imaging  of the abdomen and pelvis was performed using the standard protocol following bolus administration of intravenous contrast.   CONTRAST:  141mL OMNIPAQUE IOHEXOL 300 MG/ML  SOLN   COMPARISON:  Comparison is made with May 21, 2013.   FINDINGS: Lower chest: Minimal basilar atelectasis, no effusion. No consolidation.   Hepatobiliary: Post cholecystectomy with stable hepatic cyst near the dome of the RIGHT hemi liver. Mild biliary duct distension within expected range for cholecystectomy changes.   Pancreas: Normal, without mass, inflammation or ductal dilatation.   Spleen: Spleen normal size and contour.   Adrenals/Urinary Tract:   Adrenal glands are unremarkable. Symmetric renal enhancement. No sign of hydronephrosis. No suspicious renal lesion or perinephric stranding.   Urinary bladder is grossly unremarkable.   . Signs of renal cortical scarring. Nephrolithiasis on the RIGHT with a 4 mm calculus. Renal cysts are present bilaterally.   Urinary bladder is moderately distended extending into the low abdomen/upper pelvis.   Stomach/Bowel: Postoperative changes about the GE junction. This has the appearance of Nissen  fundoplication and has occurred since the prior imaging evaluation. No small bowel wall thickening or stranding adjacent to the small bowel. No signs of small bowel obstruction.   Scattered fluid-filled loops of small bowel and colon are present throughout the abdomen. The sigmoid is redundant but nondilated. The appendix is not visualized, no secondary signs to suggest acute appendicitis.   Vascular/Lymphatic:   Aortic atherosclerosis. No sign of aneurysm. Smooth contour of the IVC. There is no gastrohepatic or hepatoduodenal ligament lymphadenopathy. No retroperitoneal or mesenteric lymphadenopathy.   No pelvic sidewall lymphadenopathy.   Reproductive: Unremarkable by CT.  No adnexal masses.   Other: No free air.  No ascites.   Musculoskeletal: No acute bone finding. No destructive bone process. Spinal degenerative changes.   IMPRESSION: Scattered fluid-filled loops of small bowel and colon are present throughout the abdomen. Findings can be seen in the setting of gastroenteritis. No signs of fecal impaction.   Distension of the urinary bladder which is moderately distended extending to the L5 level on sagittal images. Correlate with any urinary retention or signs of bladder outlet obstruction.   Interval changes of Nissen fundoplication.   Nonobstructive RIGHT nephrolithiasis.   Aortic atherosclerosis.   Aortic Atherosclerosis (ICD10-I70.0).     Electronically Signed   By: Zetta Bills M.D.   On: 08/23/2021 14:16  CT scan was personally reviewed.  The bladder is mildly distended but not significantly.  GU tract is otherwise unremarkable other than for incidental right renal stone.   Results for orders placed or performed in visit on 10/24/21  BLADDER SCAN AMB NON-IMAGING  Result Value Ref Range   Scan Result 57    UA today shows 6-10 white blood cells and 3-10 red blood cells, otherwise unremarkable.  Assessment & Plan:    OAB/urge incontinence - She is  emptying adequately today  - May have been exasperated by constipation versus stroke versus medication it is most likely multifactorial.  - Will try medication to help with her symptoms but will avoid medication that may cause urinary retention. Will avoid  anticholinergenic  - Gemtesa 75 mg x 1 month given today as a trial  2. Chronic Constipation - Likely exasperated issues  - Titrate medication so she has 1 bowel movement daily   3. Possible Acute cystitis  -UA today is mildly suspicious, Will send urine for culture; will hold off on treatment for this.  - Will recheck her urine during visit in 1 month  will further decide if she needs cystosocpy depending on her next urinalysis   4. Right kidney stone - small and non obstructing  - recommend surveillance   Follow-up in 1 month for UA/PVR/reassessment of urinary symptoms  I have reviewed the above documentation for accuracy and completeness, and I agree with the above.   Hollice Espy, MD   [facility] [address] [place], [place] [address] [phone]

## 2021-10-24 ENCOUNTER — Other Ambulatory Visit: Payer: Self-pay

## 2021-10-24 ENCOUNTER — Ambulatory Visit (INDEPENDENT_AMBULATORY_CARE_PROVIDER_SITE_OTHER): Payer: Medicaid Other | Admitting: Urology

## 2021-10-24 ENCOUNTER — Encounter: Payer: Self-pay | Admitting: Urology

## 2021-10-24 VITALS — BP 165/78 | HR 77 | Ht 64.0 in | Wt 128.0 lb

## 2021-10-24 DIAGNOSIS — R338 Other retention of urine: Secondary | ICD-10-CM | POA: Diagnosis not present

## 2021-10-24 DIAGNOSIS — N3941 Urge incontinence: Secondary | ICD-10-CM

## 2021-10-24 LAB — URINALYSIS, COMPLETE
Bilirubin, UA: NEGATIVE
Glucose, UA: NEGATIVE
Ketones, UA: NEGATIVE
Nitrite, UA: NEGATIVE
Protein,UA: NEGATIVE
Specific Gravity, UA: 1.015 (ref 1.005–1.030)
Urobilinogen, Ur: 0.2 mg/dL (ref 0.2–1.0)
pH, UA: 7 (ref 5.0–7.5)

## 2021-10-24 LAB — MICROSCOPIC EXAMINATION

## 2021-10-24 LAB — BLADDER SCAN AMB NON-IMAGING: Scan Result: 57

## 2021-10-24 MED ORDER — GEMTESA 75 MG PO TABS: 75.0000 mg | ORAL_TABLET | Freq: Every day | ORAL | 0 refills | Status: DC

## 2021-10-28 LAB — CULTURE, URINE COMPREHENSIVE

## 2021-11-21 ENCOUNTER — Ambulatory Visit: Payer: Medicaid Other | Admitting: Urology

## 2021-12-20 ENCOUNTER — Other Ambulatory Visit: Payer: Self-pay | Admitting: Pulmonary Disease

## 2021-12-20 ENCOUNTER — Other Ambulatory Visit (HOSPITAL_COMMUNITY): Payer: Self-pay | Admitting: Pulmonary Disease

## 2021-12-20 DIAGNOSIS — J439 Emphysema, unspecified: Secondary | ICD-10-CM

## 2022-01-03 ENCOUNTER — Ambulatory Visit: Payer: Medicaid Other

## 2022-01-07 ENCOUNTER — Encounter: Payer: Self-pay | Admitting: Emergency Medicine

## 2022-01-07 DIAGNOSIS — J069 Acute upper respiratory infection, unspecified: Secondary | ICD-10-CM | POA: Diagnosis not present

## 2022-01-07 DIAGNOSIS — I10 Essential (primary) hypertension: Secondary | ICD-10-CM | POA: Insufficient documentation

## 2022-01-07 DIAGNOSIS — H6123 Impacted cerumen, bilateral: Secondary | ICD-10-CM | POA: Diagnosis not present

## 2022-01-07 DIAGNOSIS — Z20822 Contact with and (suspected) exposure to covid-19: Secondary | ICD-10-CM | POA: Diagnosis not present

## 2022-01-07 DIAGNOSIS — Z79899 Other long term (current) drug therapy: Secondary | ICD-10-CM | POA: Diagnosis not present

## 2022-01-07 DIAGNOSIS — Z7982 Long term (current) use of aspirin: Secondary | ICD-10-CM | POA: Diagnosis not present

## 2022-01-07 DIAGNOSIS — J449 Chronic obstructive pulmonary disease, unspecified: Secondary | ICD-10-CM | POA: Diagnosis not present

## 2022-01-07 DIAGNOSIS — Z85828 Personal history of other malignant neoplasm of skin: Secondary | ICD-10-CM | POA: Diagnosis not present

## 2022-01-07 DIAGNOSIS — J029 Acute pharyngitis, unspecified: Secondary | ICD-10-CM | POA: Diagnosis present

## 2022-01-07 LAB — GROUP A STREP BY PCR: Group A Strep by PCR: NOT DETECTED

## 2022-01-07 NOTE — ED Triage Notes (Signed)
Pt presents via EMS with complaints of sore throat and generalized body aches for the last 3 days. Hx of COPD and wears 2L  at baseline. Denies CP. ?

## 2022-01-08 ENCOUNTER — Emergency Department: Payer: Medicaid Other

## 2022-01-08 ENCOUNTER — Emergency Department
Admission: EM | Admit: 2022-01-08 | Discharge: 2022-01-08 | Disposition: A | Discharge status: Home / Self Care | Payer: Medicaid Other | Attending: Emergency Medicine | Admitting: Emergency Medicine

## 2022-01-08 DIAGNOSIS — H6123 Impacted cerumen, bilateral: Secondary | ICD-10-CM

## 2022-01-08 DIAGNOSIS — J069 Acute upper respiratory infection, unspecified: Secondary | ICD-10-CM

## 2022-01-08 LAB — RESP PANEL BY RT-PCR (FLU A&B, COVID) ARPGX2
Influenza A by PCR: NEGATIVE
Influenza B by PCR: NEGATIVE
SARS Coronavirus 2 by RT PCR: NEGATIVE

## 2022-01-08 MED ORDER — DEXAMETHASONE SODIUM PHOSPHATE 10 MG/ML IJ SOLN
10.0000 mg | Freq: Once | INTRAMUSCULAR | Status: AC
  Administered 2022-01-08: 10 mg via INTRAMUSCULAR
  Filled 2022-01-08: qty 1

## 2022-01-08 MED ORDER — DOCUSATE SODIUM 50 MG/5ML PO LIQD
50.0000 mg | Freq: Once | ORAL | Status: AC
  Administered 2022-01-08: 50 mg via OTIC
  Filled 2022-01-08: qty 10

## 2022-01-08 NOTE — ED Provider Notes (Signed)
? ?[facility] ?Provider Note ? ? ? Event Date/Time  ? First MD Initiated Contact with Patient 01/08/22 0206   ?  (approximate) ? ? ?History  ? ?Sore Throat ? ? ?HPI ? ?Sylvia Richardson is a 66 y.o. female with history of COPD on 2 L nasal cannula, hypertension, hyperlipidemia, CVA who presents to the emergency department with several days of cough, congestion, sore throat, bilateral ear pain worse on the left.  No chest pain or shortness of breath.  No vomiting or diarrhea.  No known sick contacts or recent travel. ? ? ?History provided by patient and family. ? ? ? ?Past Medical History:  ?Diagnosis Date  ? Bipolar disorder ([place])   ? Cancer [facility])   ? skin  ? Carotid artery disease ([place])   ? COPD (chronic obstructive pulmonary disease) ([place])   ? H/O degenerative disc disease   ? Hypercholesteremia   ? Hypertension   ? Stroke [facility])   ? ? ?Past Surgical History:  ?Procedure Laterality Date  ? BREAST BIOPSY Left   ? core bx- neg  ? CESAREAN SECTION    ? CHOLECYSTECTOMY    ? TONSILLECTOMY    ? TUBAL LIGATION    ? ? ?MEDICATIONS:  ?Prior to Admission medications   ?Medication Sig Start Date End Date Taking? Authorizing Provider  ?aspirin EC 81 MG tablet Take 81 mg by mouth daily.  10/02/10   [provider]  ?atenolol (TENORMIN) 100 MG tablet Take 100 mg by mouth daily.    [provider]  ?clonazePAM (KLONOPIN) 0.5 MG tablet Take 0.5 mg by mouth 4 (four) times daily as needed for anxiety.    [provider]  ?DALIRESP 500 MCG TABS tablet TAKE 1 TABLET BY MOUTH EVERY DAY(PLEASE KEEP APPT. IN [place]) 02/28/21   Allyne Gee, MD  ?DULoxetine (CYMBALTA) 60 MG capsule Take 120 mg by mouth daily.     [provider]  ?esomeprazole (NEXIUM) 20 MG capsule Take 20 mg by mouth 2 (two) times daily.    [provider]  ?hydrochlorothiazide (HYDRODIURIL) 25 MG tablet Take 25 mg by mouth daily.    [provider]  ?metoCLOPramide (REGLAN) 10 MG tablet  Take 10 mg by mouth in the morning, at noon, in the evening, and at bedtime.    [provider]  ?montelukast (SINGULAIR) 10 MG tablet Take 10 mg by mouth at bedtime.  02/26/14   [provider]  ?predniSONE (DELTASONE) 10 MG tablet Take 10 mg by mouth daily.    [provider]  ?PROVENTIL HFA 108 (90 Base) MCG/ACT inhaler INHALE 2 PUFFS INTO THE LUNGS EVERY 4 HOURS AS NEEDED FOR WHEEZING OR SHORTNESS OF BREATH 05/01/20   Allyne Gee, MD  ?traZODone (DESYREL) 100 MG tablet Take 100-200 mg by mouth at bedtime.    [provider]  ?Vibegron (GEMTESA) 75 MG TABS Take 75 mg by mouth daily. 10/24/21   Hollice Espy, MD  ? ? ?Physical Exam  ? ?Triage Vital Signs: ?ED Triage Vitals  ?Enc Vitals Group  ?   BP 01/07/22 2258 132/64  ?   Pulse Rate 01/07/22 2258 76  ?   Resp 01/07/22 2258 20  ?   Temp 01/07/22 2258 98.4 ?F (36.9 ?C)  ?   Temp Source 01/07/22 2258 Oral  ?   SpO2 01/07/22 2258 94 %  ?   Weight 01/07/22 2259 129 lb (58.5 kg)  ?   Height 01/07/22 2259 5'  4" (1.626 m)  ?   Head Circumference --   ?   Peak Flow --   ?   Pain Score 01/07/22 2258 10  ?   Pain Loc --   ?   Pain Edu? --   ?   Excl. in [place]? --   ? ? ?Most recent vital signs: ?Vitals:  ? 01/07/22 2258 01/08/22 0400  ?BP: 132/64 (!) 148/56  ?Pulse: 76 75  ?Resp: 20 20  ?Temp: 98.4 ?F (36.9 ?C)   ?SpO2: 94% 94%  ? ? ?CONSTITUTIONAL: Alert and oriented and responds appropriately to questions.  Chronically ill-appearing but afebrile and nontoxic ?HEAD: Normocephalic, atraumatic ?EYES: Conjunctivae clear, pupils appear equal, sclera nonicteric ?ENT: normal nose; moist mucous membranes; No pharyngeal erythema or petechiae, no tonsillar hypertrophy or exudate, no uvular deviation, no unilateral swelling in posterior oropharynx, no trismus or drooling, no muffled voice, normal phonation, no stridor, airway patent.  TMs are unable to be visualized bilaterally due to bilateral cerumen impaction.  No foreign body in the external  auditory canal.  No inflammation, erythema or drainage from the external auditory canal. No signs of mastoiditis. No pain with manipulation of the pinna bilaterally. ?NECK: Supple, normal ROM, no meningismus, bilateral mild cervical lymphadenopathy noted anteriorly ?CARD: RRR; S1 and S2 appreciated; no murmurs, no clicks, no rubs, no gallops ?RESP: Normal chest excursion without splinting or tachypnea; breath sounds clear and equal bilaterally; no wheezes, no rhonchi, no rales, no hypoxia or respiratory distress, speaking full sentences ?ABD/GI: Normal bowel sounds; non-distended; soft, non-tender, no rebound, no guarding, no peritoneal signs ?BACK: The back appears normal ?EXT: Normal ROM in all joints; no deformity noted, no edema; no cyanosis ?SKIN: Normal color for age and race; warm; no rash on exposed skin ?NEURO: Moves all extremities equally, normal speech ?PSYCH: The patient's mood and manner are appropriate. ? ? ?ED Results / Procedures / Treatments  ? ?LABS: ?(all labs ordered are listed, but only abnormal results are displayed) ?Labs Reviewed  ?RESP PANEL BY RT-PCR (FLU A&B, COVID) [id number]  ?GROUP A STREP BY PCR  ? ? ? ?EKG: ? ? ?RADIOLOGY: ?My personal review and interpretation of imaging: Chest x-ray shows no pneumonia. ? ?I have personally reviewed all radiology reports.   ?DG Chest 2 View ? ?Result Date: 01/08/2022 ?CLINICAL DATA:  Fever, cough EXAM: CHEST - 2 VIEW COMPARISON:  08/23/2021. FINDINGS: Cardiac and mediastinal contours are within normal limits. Redemonstrated elevation of the right hemidiaphragm. No focal pulmonary opacity. No pleural effusion or pneumothorax. No acute osseous abnormality. IMPRESSION: No acute cardiopulmonary process. Electronically Signed   By: Merilyn Baba M.D.   On: 01/08/2022 03:01   ? ? ?PROCEDURES: ? ?Critical Care performed: No ? ? ? ? ? ?.Ear Cerumen Removal ? ?Date/Time: 01/08/2022 4:00 AM ?Performed by: Alecia Lemming, RN ?Authorized by: Jayceon Troy, Delice Bison, DO   ? ?Consent:  ?  Consent obtained:  Verbal ?  Consent given by:  Patient ?  Risks, benefits, and alternatives were discussed: yes   ?  Risks discussed:  Bleeding, infection, pain, dizziness, incomplete removal and TM perforation ?  Alternatives discussed:  No treatment, alternative treatment and delayed treatment ?Universal protocol:  ?  Procedure explained and questions answered to patient or proxy's satisfaction: yes   ?  Relevant documents present and verified: yes   ?  Test results available: yes   ?  Imaging studies available: yes   ?  Required blood products, implants, devices, and special equipment  available: yes   ?  Site/side marked: yes   ?  Immediately prior to procedure, a time out was called: yes   ?  Patient identity confirmed:  Verbally with patient ?Procedure details:  ?  Location:  L ear and R ear ?  Procedure type: irrigation   ?  Procedure outcomes: unable to remove cerumen   ?Post-procedure details:  ?  Post-procedure ear inspection: No significant improvement after using Colace and irrigation.  There was some movement of the cerumen in the left ear and I was able to visualize part of the TM and it appears normal without redness, bulging, purulence, perforation or drainage. ?  Procedure completion:  Tolerated well, no immediate complications ? ? ? ?IMPRESSION / MDM / ASSESSMENT AND PLAN / ED COURSE  ?I reviewed the triage vital signs and the nursing notes. ? ? ? ?Patient here with symptoms of viral URI.  Has had cough, congestion, sore throat, body aches for several days. ? ? ? ? ?DIFFERENTIAL DIAGNOSIS (includes but not limited to):   Viral URI, strep pharyngitis, otitis media, cerumen impaction, pneumonia.  Doubt uvulitis, peritonsillar abscess, deep space neck infection, meningitis, bacteremia, sepsis. ? ? ?PLAN: We will obtain chest x-ray, COVID and flu swabs, strep swab.  Will give Decadron for symptomatic relief.  She is not a diabetic. ? ?She has bilateral cerumen impaction and I am not  able to visualize her TMs bilaterally.  We will place Colace in both ears and attempt to irrigate. ? ? ?MEDICATIONS GIVEN IN ED: ?Medications  ?dexamethasone (DECADRON) injection 10 mg (10 mg Intramuscular

## 2022-01-08 NOTE — ED Notes (Signed)
Bilateral ears irrigated with no notable earwax removed. Patient requested for irrigation to be stopped, stated she could not tolerate any more irrigation. ? ?

## 2022-01-08 NOTE — Discharge Instructions (Signed)
You may use Tylenol 1000 mg every 6 hours as needed for fever and pain.   ? ?You may use over-the-counter guaifenesin and dextromethorphan as needed for cough. ? ?Your COVID, flu, strep test were negative.  Your chest x-ray showed no pneumonia.  Your symptoms are due to a viral illness.  You do not need to be on antibiotics. ?

## 2022-03-04 ENCOUNTER — Encounter: Payer: Self-pay | Admitting: *Deleted

## 2022-03-05 ENCOUNTER — Ambulatory Visit: Payer: Medicaid Other | Admitting: Anesthesiology

## 2022-03-05 ENCOUNTER — Encounter
Admission: RE | Disposition: A | Discharge status: Home / Self Care | Payer: Self-pay | Source: Home / Self Care | Attending: Gastroenterology

## 2022-03-05 ENCOUNTER — Ambulatory Visit
Admission: RE | Admit: 2022-03-05 | Discharge: 2022-03-05 | Disposition: A | Discharge status: Home / Self Care | Payer: Medicaid Other | Attending: Gastroenterology | Admitting: Gastroenterology

## 2022-03-05 DIAGNOSIS — J449 Chronic obstructive pulmonary disease, unspecified: Secondary | ICD-10-CM | POA: Diagnosis not present

## 2022-03-05 DIAGNOSIS — K219 Gastro-esophageal reflux disease without esophagitis: Secondary | ICD-10-CM | POA: Insufficient documentation

## 2022-03-05 DIAGNOSIS — R1310 Dysphagia, unspecified: Secondary | ICD-10-CM | POA: Diagnosis present

## 2022-03-05 DIAGNOSIS — D120 Benign neoplasm of cecum: Secondary | ICD-10-CM | POA: Insufficient documentation

## 2022-03-05 DIAGNOSIS — Z1211 Encounter for screening for malignant neoplasm of colon: Secondary | ICD-10-CM | POA: Insufficient documentation

## 2022-03-05 DIAGNOSIS — F17200 Nicotine dependence, unspecified, uncomplicated: Secondary | ICD-10-CM | POA: Insufficient documentation

## 2022-03-05 DIAGNOSIS — K640 First degree hemorrhoids: Secondary | ICD-10-CM | POA: Insufficient documentation

## 2022-03-05 DIAGNOSIS — D123 Benign neoplasm of transverse colon: Secondary | ICD-10-CM | POA: Insufficient documentation

## 2022-03-05 DIAGNOSIS — M1990 Unspecified osteoarthritis, unspecified site: Secondary | ICD-10-CM | POA: Diagnosis not present

## 2022-03-05 DIAGNOSIS — K2289 Other specified disease of esophagus: Secondary | ICD-10-CM | POA: Diagnosis not present

## 2022-03-05 DIAGNOSIS — F319 Bipolar disorder, unspecified: Secondary | ICD-10-CM | POA: Insufficient documentation

## 2022-03-05 DIAGNOSIS — D127 Benign neoplasm of rectosigmoid junction: Secondary | ICD-10-CM | POA: Insufficient documentation

## 2022-03-05 DIAGNOSIS — F419 Anxiety disorder, unspecified: Secondary | ICD-10-CM | POA: Diagnosis not present

## 2022-03-05 DIAGNOSIS — Z79899 Other long term (current) drug therapy: Secondary | ICD-10-CM | POA: Diagnosis not present

## 2022-03-05 DIAGNOSIS — Z7952 Long term (current) use of systemic steroids: Secondary | ICD-10-CM | POA: Insufficient documentation

## 2022-03-05 DIAGNOSIS — I10 Essential (primary) hypertension: Secondary | ICD-10-CM | POA: Insufficient documentation

## 2022-03-05 HISTORY — DX: Epigastric pain: R10.13

## 2022-03-05 HISTORY — DX: Headache, unspecified: R51.9

## 2022-03-05 HISTORY — DX: Gastro-esophageal reflux disease without esophagitis: K21.9

## 2022-03-05 HISTORY — DX: Social phobia, unspecified: F40.10

## 2022-03-05 HISTORY — PX: ESOPHAGOGASTRODUODENOSCOPY (EGD) WITH PROPOFOL: SHX5813

## 2022-03-05 HISTORY — DX: Other chronic cystitis without hematuria: N30.20

## 2022-03-05 HISTORY — DX: Dysphagia, unspecified: R13.10

## 2022-03-05 HISTORY — PX: COLONOSCOPY WITH PROPOFOL: SHX5780

## 2022-03-05 HISTORY — DX: Anxiety disorder, unspecified: F41.9

## 2022-03-05 HISTORY — DX: Nicotine dependence, unspecified, uncomplicated: F17.200

## 2022-03-05 HISTORY — DX: Depression, unspecified: F32.A

## 2022-03-05 SURGERY — COLONOSCOPY WITH PROPOFOL
Anesthesia: General

## 2022-03-05 MED ORDER — PROPOFOL 500 MG/50ML IV EMUL
INTRAVENOUS | Status: DC | PRN
  Administered 2022-03-05: 150 ug/kg/min via INTRAVENOUS

## 2022-03-05 MED ORDER — LIDOCAINE HCL (CARDIAC) PF 100 MG/5ML IV SOSY
PREFILLED_SYRINGE | INTRAVENOUS | Status: DC | PRN
  Administered 2022-03-05: 100 mg via INTRAVENOUS

## 2022-03-05 MED ORDER — SODIUM CHLORIDE 0.9 % IV SOLN
INTRAVENOUS | Status: DC
  Administered 2022-03-05: 20 mL/h via INTRAVENOUS

## 2022-03-05 MED ORDER — PROPOFOL 10 MG/ML IV BOLUS
INTRAVENOUS | Status: DC | PRN
  Administered 2022-03-05 (×2): 20 mg via INTRAVENOUS
  Administered 2022-03-05: 60 mg via INTRAVENOUS
  Administered 2022-03-05: 30 mg via INTRAVENOUS

## 2022-03-05 MED ORDER — PROPOFOL 500 MG/50ML IV EMUL
INTRAVENOUS | Status: AC
  Filled 2022-03-05: qty 350

## 2022-03-05 MED ORDER — DEXMEDETOMIDINE HCL IN NACL 200 MCG/50ML IV SOLN
INTRAVENOUS | Status: DC | PRN
  Administered 2022-03-05: 12 ug via INTRAVENOUS

## 2022-03-05 NOTE — Transfer of Care (Signed)
Immediate Anesthesia Transfer of Care Note  Patient: Sylvia Richardson  Procedure(s) Performed: COLONOSCOPY WITH PROPOFOL ESOPHAGOGASTRODUODENOSCOPY (EGD) WITH PROPOFOL  Patient Location: PACU and Endoscopy Unit  Anesthesia Type:General  Level of Consciousness: drowsy  Airway & Oxygen Therapy: Patient Spontanous Breathing  Post-op Assessment: Report given to RN  Post vital signs: stable  Last Vitals:  Vitals Value Taken Time  BP    Temp    Pulse    Resp    SpO2      Last Pain:  Vitals:   03/05/22 0652  TempSrc:   PainSc: 0-No pain         Complications: No notable events documented.

## 2022-03-05 NOTE — Op Note (Signed)
[facility] Gastroenterology Patient Name: Sylvia Richardson Procedure Date: 03/05/2022 7:27 AM MRN: [id number] Account #: [id number] Date of Birth: [birth year] Admit Type: Outpatient Age: 66 Room: [facility] ENDO ROOM 1 Gender: Female Note Status: Finalized Instrument Name: Upper Endoscope [id number] Procedure:             Upper GI endoscopy Indications:           Dysphagia, Gastro-esophageal reflux disease Providers:             Andrey Farmer MD, MD Referring MD:          Jearld Fenton (Referring MD) Medicines:             Monitored Anesthesia Care Complications:         No immediate complications. Estimated blood loss:                         Minimal. Procedure:             Pre-Anesthesia Assessment:                        - Prior to the procedure, a History and Physical was                         performed, and patient medications and allergies were                         reviewed. The patient is competent. The risks and                         benefits of the procedure and the sedation options and                         risks were discussed with the patient. All questions                         were answered and informed consent was obtained.                         Patient identification and proposed procedure were                         verified by the physician, the nurse, the                         anesthesiologist, the anesthetist and the technician                         in the endoscopy suite. Mental Status Examination:                         alert and oriented. Airway Examination: normal                         oropharyngeal airway and neck mobility. Respiratory                         Examination: clear to auscultation. CV Examination:  normal. Prophylactic Antibiotics: The patient does not                         require prophylactic antibiotics. Prior                         Anticoagulants: The patient has taken no previous                          anticoagulant or antiplatelet agents. ASA Grade                         Assessment: III - A patient with severe systemic                         disease. After reviewing the risks and benefits, the                         patient was deemed in satisfactory condition to                         undergo the procedure. The anesthesia plan was to use                         monitored anesthesia care (MAC). Immediately prior to                         administration of medications, the patient was                         re-assessed for adequacy to receive sedatives. The                         heart rate, respiratory rate, oxygen saturations,                         blood pressure, adequacy of pulmonary ventilation, and                         response to care were monitored throughout the                         procedure. The physical status of the patient was                         re-assessed after the procedure.                        After obtaining informed consent, the endoscope was                         passed under direct vision. Throughout the procedure,                         the patient's blood pressure, pulse, and oxygen                         saturations were monitored continuously. The Endoscope  was introduced through the mouth, and advanced to the                         second part of duodenum. The upper GI endoscopy was                         accomplished without difficulty. The patient tolerated                         the procedure well. Findings:      Localized mild mucosal changes characterized by altered texture were       found in the distal esophagus. Biopsies were taken with a cold forceps       for histology. Estimated blood loss was minimal.      No endoscopic abnormality was evident in the esophagus to explain the       patient's complaint of dysphagia. It was decided, however, to proceed       with dilation at  the gastroesophageal junction. A TTS dilator was passed       through the scope. Dilation with a 15-16.5-18 mm balloon dilator was       performed to 18 mm. The dilation site was examined and showed no change.       Biopsies were obtained from the proximal and distal esophagus with cold       forceps for histology of suspected eosinophilic esophagitis. Estimated       blood loss was minimal.      Evidence of an anti-reflux surgical site was found in the gastric       fundus. This was characterized by healthy appearing mucosa.      The exam of the stomach was otherwise normal.      The examined duodenum was normal. Impression:            - Texture changed mucosa in the esophagus. Biopsied.                        - No endoscopic esophageal abnormality to explain                         patient's dysphagia. Esophagus dilated. Dilated.                         Biopsied.                        - An anti-reflux surgical site was found,                         characterized by healthy appearing mucosa.                        - Normal examined duodenum. Recommendation:        - Discharge patient to home.                        - Resume previous diet.                        - Continue present medications.                        -  Await pathology results.                        - Perform a colonoscopy today. Procedure Code(s):     --- Professional ---                        [phone], Esophagogastroduodenoscopy, flexible,                         transoral; with transendoscopic balloon dilation of                         esophagus (less than 30 mm diameter) Diagnosis Code(s):     --- Professional ---                        K22.8, Other specified diseases of esophagus                        R13.10, Dysphagia, unspecified                        Z98.890, Other specified postprocedural states                        K21.9, Gastro-esophageal reflux disease without                         esophagitis CPT  copyright 2019 American Medical Association. All rights reserved. The codes documented in this report are preliminary and upon coder review may  be revised to meet current compliance requirements. Andrey Farmer MD, MD 03/05/2022 9:05:59 AM Number of Addenda: 0 Note Initiated On: 03/05/2022 7:27 AM Estimated Blood Loss:  Estimated blood loss was minimal.      [facility]

## 2022-03-05 NOTE — Op Note (Signed)
[facility] Gastroenterology Patient Name: Stephanye Finnicum Procedure Date: 03/05/2022 7:25 AM MRN: [id number] Account #: [id number] Date of Birth: Aug 14, 1956 Admit Type: Outpatient Age: 66 Room: [facility] ENDO ROOM 1 Gender: Female Note Status: Finalized Instrument Name: Jasper Riling [id number] Procedure:             Colonoscopy Indications:           Surveillance: Personal history of adenomatous polyps                         on last colonoscopy > 5 years ago Providers:             Andrey Farmer MD, MD Referring MD:          Jearld Fenton (Referring MD) Medicines:             Monitored Anesthesia Care Complications:         No immediate complications. Estimated blood loss:                         Minimal. Procedure:             Pre-Anesthesia Assessment:                        - Prior to the procedure, a History and Physical was                         performed, and patient medications and allergies were                         reviewed. The patient is competent. The risks and                         benefits of the procedure and the sedation options and                         risks were discussed with the patient. All questions                         were answered and informed consent was obtained.                         Patient identification and proposed procedure were                         verified by the physician, the nurse, the                         anesthesiologist, the anesthetist and the technician                         in the endoscopy suite. Mental Status Examination:                         alert and oriented. Airway Examination: normal                         oropharyngeal airway and neck mobility. Respiratory  Examination: clear to auscultation. CV Examination:                         normal. Prophylactic Antibiotics: The patient does not                         require prophylactic antibiotics. Prior                          Anticoagulants: The patient has taken no previous                         anticoagulant or antiplatelet agents. ASA Grade                         Assessment: III - A patient with severe systemic                         disease. After reviewing the risks and benefits, the                         patient was deemed in satisfactory condition to                         undergo the procedure. The anesthesia plan was to use                         monitored anesthesia care (MAC). Immediately prior to                         administration of medications, the patient was                         re-assessed for adequacy to receive sedatives. The                         heart rate, respiratory rate, oxygen saturations,                         blood pressure, adequacy of pulmonary ventilation, and                         response to care were monitored throughout the                         procedure. The physical status of the patient was                         re-assessed after the procedure.                        After obtaining informed consent, the colonoscope was                         passed under direct vision. Throughout the procedure,                         the patient's blood pressure, pulse, and oxygen  saturations were monitored continuously. The                         Colonoscope was introduced through the anus and                         advanced to the the ileocecal valve. The colonoscopy                         was somewhat difficult. The patient tolerated the                         procedure well. The quality of the bowel preparation                         was fair. Findings:      The perianal and digital rectal examinations were normal.      A 20 mm polyp was found in the cecum. The polyp was sessile. The polyp       was removed with a saline injection-lift technique using a hot snare.       The polyp was removed with a piecemeal technique  using a hot snare.       Resection was complete, but the polyp tissue was only partially       retrieved. The edges of the polypectomy site were burned with soft-tip       cautery. To prevent bleeding post-intervention, four hemostatic clips       were successfully placed. There was no bleeding during, or at the end,       of the procedure.      A 11 mm polyp was found in the hepatic flexure. The polyp was sessile.       The polyp was removed with a piecemeal technique using a hot snare.       Resection and retrieval were complete. Estimated blood loss was minimal.       This area was not tattooed due to being right across from liver which       can be used for marking (see picture).      Two sessile polyps were found in the transverse colon. The polyps were 3       to 4 mm in size. These polyps were removed with a cold snare. Resection       and retrieval were complete. Estimated blood loss was minimal.      A 18 mm polyp was found in the recto-sigmoid colon. The polyp was       pedunculated. The polyp was removed with a hot snare. Resection and       retrieval were complete. To prevent bleeding post-intervention, two       hemostatic clips were successfully placed. There was no bleeding during,       or at the end, of the procedure. Area was tattooed with an injection of       0.2 mL of [place] ink about 2 cm distal to polypectomy site.      A 10 mm polyp was found in the recto-sigmoid colon. The polyp was       sessile. The polyp was removed with a hot snare. Resection and retrieval       were complete. To prevent bleeding post-intervention, one hemostatic       clip was  successfully placed. There was no bleeding at the end of the       procedure. This polyp was in close proximity to the larger pedunculated       polyp. The pit pattern looked to be advanced histology so if needed to       review polypectomy site, the tatto from the larger lesion can be used.      There was other polyps  noted in ascending colon and rectum that were not       removed due to excessive length of procedure. These can be removed at       her follow-up colonoscopy.      Internal hemorrhoids were found during retroflexion. The hemorrhoids       were Grade I (internal hemorrhoids that do not prolapse). Impression:            - Preparation of the colon was fair.                        - One 20 mm polyp in the cecum, removed using                         injection-lift and a hot snare and removed piecemeal                         using a hot snare. Complete resection. Partial                         retrieval. Clips were placed.                        - One 11 mm polyp at the hepatic flexure, removed                         piecemeal using a hot snare. Resected and retrieved.                        - Two 3 to 4 mm polyps in the transverse colon,                         removed with a cold snare. Resected and retrieved.                        - One 18 mm polyp at the recto-sigmoid colon, removed                         with a hot snare. Resected and retrieved. Clips were                         placed. Tattooed.                        - One 10 mm polyp at the recto-sigmoid colon, removed                         with a hot snare. Resected and retrieved. Clip was                         placed.                        -  Internal hemorrhoids. Recommendation:        - Repeat colonoscopy in 6 months for surveillance                         after piecemeal polypectomy.                        - Return to referring physician as previously                         scheduled.                        - Discharge patient to home.                        - Resume previous diet.                        - Continue present medications.                        - Await pathology results.                        - Given large polypectomy at multiple sites, potential                         signs of complications were  discussed with patient and                         was recommended to seek immediate care if any fevers,                         chills, and/or severe abdominal pain Procedure Code(s):     --- Professional ---                        [phone], Colonoscopy, flexible; with removal of                         tumor(s), polyp(s), or other lesion(s) by snare                         technique                        [id number], Colonoscopy, flexible; with directed submucosal                         injection(s), any substance Diagnosis Code(s):     --- Professional ---                        K63.5, Polyp of colon                        Z86.010, Personal history of colonic polyps                        K64.0, First degree hemorrhoids CPT copyright 2019 American Medical Association. All rights reserved. The codes documented in this report are preliminary and upon coder review may  be revised to meet current compliance requirements. Andrey Farmer MD, MD 03/05/2022 9:20:28 AM Number of Addenda: 0 Note Initiated On: 03/05/2022 7:25 AM Scope Withdrawal Time: 0 hours 39 minutes 38 seconds  Total Procedure Duration: 0 hours 56 minutes 52 seconds  Estimated Blood Loss:  Estimated blood loss was minimal.      [facility]

## 2022-03-05 NOTE — H&P (Signed)
Outpatient short stay form Pre-procedure 03/05/2022  Lesly Rubenstein, MD  Primary Physician: Barbaraann Boys, MD  Reason for visit:  GERD/Dysphagia and history of polyps  History of present illness:    66 y/o lady with bipolar, hypertension, and tobacco abuse here for EGD/Colonoscopy for GERD and dysphagia to solids and liquids. Colonoscopy for large TVA in 2012 for which she did not have subsequent follow-up. No blood thinners. No family history of GI malignancies. Last colonoscopy in 2012 with large TVA. History of paraesophageal hernia repair and cholecystectomy.    Current Facility-Administered Medications:    0.9 %  sodium chloride infusion, , Intravenous, Continuous, Maze Corniel, Hilton Cork, MD, Last Rate: 20 mL/hr at 03/05/22 0659, 20 mL/hr at 03/05/22 0659  Medications Prior to Admission  Medication Sig Dispense Refill Last Dose   aspirin EC 81 MG tablet Take 81 mg by mouth daily.    Past Week   atenolol (TENORMIN) 100 MG tablet Take 100 mg by mouth daily.  0 03/05/2022 at 0500   clonazePAM (KLONOPIN) 0.5 MG tablet Take 0.5 mg by mouth 4 (four) times daily as needed for anxiety.   03/04/2022   DALIRESP 500 MCG TABS tablet TAKE 1 TABLET BY MOUTH EVERY DAY(PLEASE KEEP APPT. IN JULY) 30 tablet 3 03/04/2022   DULoxetine (CYMBALTA) 60 MG capsule Take 120 mg by mouth daily.   0 03/04/2022   esomeprazole (NEXIUM) 20 MG capsule Take 20 mg by mouth 2 (two) times daily.   03/04/2022   hydrochlorothiazide (HYDRODIURIL) 25 MG tablet Take 25 mg by mouth daily.   03/04/2022   metoCLOPramide (REGLAN) 10 MG tablet Take 10 mg by mouth in the morning, at noon, in the evening, and at bedtime.   03/04/2022   montelukast (SINGULAIR) 10 MG tablet Take 10 mg by mouth at bedtime.    03/04/2022   predniSONE (DELTASONE) 10 MG tablet Take 10 mg by mouth daily.   03/04/2022   PROVENTIL HFA 108 (90 Base) MCG/ACT inhaler INHALE 2 PUFFS INTO THE LUNGS EVERY 4 HOURS AS NEEDED FOR WHEEZING OR SHORTNESS OF BREATH 6.7 g 1  03/05/2022 at 0500   traZODone (DESYREL) 100 MG tablet Take 100-200 mg by mouth at bedtime.   03/04/2022   Vibegron (GEMTESA) 75 MG TABS Take 75 mg by mouth daily. 30 tablet 0 03/04/2022     Allergies  Allergen Reactions   Bactrim [Sulfamethoxazole-Trimethoprim] Diarrhea   Ciprofloxacin Anaphylaxis   Clindamycin/Lincomycin Nausea And Vomiting   Zithromax [Azithromycin]     Abdomen pain and acid reflux    Choline Fenofibrate     Other reaction(s): Other (See Comments) Other Reaction: LEFT SIDE TONGUE SWELLING Other reaction(s): Other (See Comments) LEFT SIDE TONGUE SWELLING Other Reaction: LEFT SIDE TONGUE SWELLING   Doxycycline Nausea Only   Fish Oil Nausea Only   Levaquin [Levofloxacin] Rash   Niacin Diarrhea   Nifedipine Diarrhea   Rosuvastatin     Other reaction(s): Headache     Past Medical History:  Diagnosis Date   Anxiety    Bipolar disorder ([place])    Cancer ([place])    skin   Carotid artery disease (H[facility])    Chronic cystitis    COPD (chronic obstructive pulmonary disease) (HCC)    Depression    Dysphagia    Epigastric pain    GERD (gastroesophageal reflux disease)    H/O degenerative disc disease    Headache    Hypercholesteremia    Hypertension    Severe tobacco use disorder  Social phobia    Stroke [facility])     Review of systems:  Otherwise negative.    Physical Exam  Gen: Alert, oriented. Appears stated age.  HEENT: PERRLA. Lungs: No respiratory distress CV: RRR Abd: soft, benign, no masses Ext: No edema    Planned procedures: Proceed with EGD/colonoscopy. The patient understands the nature of the planned procedure, indications, risks, alternatives and potential complications including but not limited to bleeding, infection, perforation, damage to internal organs and possible oversedation/side effects from anesthesia. The patient agrees and gives consent to proceed.  Please refer to procedure notes for findings, recommendations and patient  disposition/instructions.     Lesly Rubenstein, MD [facility] Gastroenterology

## 2022-03-05 NOTE — Anesthesia Preprocedure Evaluation (Signed)
Anesthesia Evaluation  Patient identified by MRN, date of birth, ID band Patient awake    Reviewed: Allergy & Precautions, NPO status , Patient's Chart, lab work & pertinent test results  Airway Mallampati: II  TM Distance: >3 FB Neck ROM: Full    Dental  (+) Teeth Intact   Pulmonary neg pulmonary ROS, COPD,  COPD inhaler, Current Smoker and Patient abstained from smoking.,    Pulmonary exam normal  + decreased breath sounds      Cardiovascular Exercise Tolerance: Poor hypertension, Pt. on medications + Peripheral Vascular Disease  negative cardio ROS Normal cardiovascular exam Rhythm:Regular     Neuro/Psych  Headaches, Anxiety Depression Bipolar Disorder negative neurological ROS  negative psych ROS   GI/Hepatic negative GI ROS, Neg liver ROS, GERD  Medicated,  Endo/Other  negative endocrine ROS  Renal/GU negative Renal ROS  negative genitourinary   Musculoskeletal  (+) Arthritis ,   Abdominal Normal abdominal exam  (+)   Peds negative pediatric ROS (+)  Hematology negative hematology ROS (+)   Anesthesia Other Findings Past Medical History: No date: Anxiety No date: Bipolar disorder ([place]) No date: Cancer ([place])     Comment:  skin No date: Carotid artery disease (HCC) No date: Chronic cystitis No date: COPD (chronic obstructive pulmonary disease) (HCC) No date: Depression No date: Dysphagia No date: Epigastric pain No date: GERD (gastroesophageal reflux disease) No date: H/O degenerative disc disease No date: Headache No date: Hypercholesteremia No date: Hypertension No date: Severe tobacco use disorder No date: Social phobia No date: Stroke [facility])  Past Surgical History: No date: BREAST BIOPSY; Left     Comment:  core bx- neg No date: CESAREAN SECTION No date: CHOLECYSTECTOMY No date: ESOPHAGOGASTRODUODENOSCOPY No date: HEMORRHOIDECTOMY WITH HEMORRHOID BANDING No date: history colonic polyps No  date: Laparoscopic repair paraesophageal hiatal hernia No date: Nissen Fundoplication No date: TONSILLECTOMY No date: TUBAL LIGATION  BMI    Body Mass Index: 21.97 kg/m      Reproductive/Obstetrics negative OB ROS                             Anesthesia Physical Anesthesia Plan  ASA: 3  Anesthesia Plan: General   Post-op Pain Management:    Induction: Intravenous  PONV Risk Score and Plan: Propofol infusion and TIVA  Airway Management Planned: Natural Airway  Additional Equipment:   Intra-op Plan:   Post-operative Plan:   Informed Consent: I have reviewed the patients History and Physical, chart, labs and discussed the procedure including the risks, benefits and alternatives for the proposed anesthesia with the patient or authorized representative who has indicated his/her understanding and acceptance.     Dental Advisory Given  Plan Discussed with: CRNA and Surgeon  Anesthesia Plan Comments:         Anesthesia Quick Evaluation

## 2022-03-05 NOTE — Interval H&P Note (Signed)
History and Physical Interval Note:  03/05/2022 7:45 AM  Sylvia Richardson  has presented today for surgery, with the diagnosis of Z86.010  - History of colonic polyps K21.9  - Gastroesophageal reflux disease, unspecified whether esophagitis present R13.10  - Dysphagia, unspecified type.  The various methods of treatment have been discussed with the patient and family. After consideration of risks, benefits and other options for treatment, the patient has consented to  Procedure(s): COLONOSCOPY WITH PROPOFOL (N/A) ESOPHAGOGASTRODUODENOSCOPY (EGD) WITH PROPOFOL (N/A) as a surgical intervention.  The patient's history has been reviewed, patient examined, no change in status, stable for surgery.  I have reviewed the patient's chart and labs.  Questions were answered to the patient's satisfaction.     Lesly Rubenstein  Ok to proceed with EGD/Colonoscopy

## 2022-03-05 NOTE — Anesthesia Postprocedure Evaluation (Signed)
Anesthesia Post Note  Patient: Sylvia Richardson  Procedure(s) Performed: COLONOSCOPY WITH PROPOFOL ESOPHAGOGASTRODUODENOSCOPY (EGD) WITH PROPOFOL  Patient location during evaluation: PACU Anesthesia Type: General Level of consciousness: awake and awake and alert Pain management: satisfactory to patient Vital Signs Assessment: post-procedure vital signs reviewed and stable Respiratory status: spontaneous breathing and respiratory function stable Cardiovascular status: stable Anesthetic complications: no   No notable events documented.   Last Vitals:  Vitals:   03/05/22 0915 03/05/22 0925  BP: (!) 146/56   Pulse: 81   Resp: 17   Temp:    SpO2: 100% 97%    Last Pain:  Vitals:   03/05/22 0925  TempSrc:   PainSc: 0-No pain                 VAN STAVEREN,[name]

## 2022-03-06 ENCOUNTER — Encounter: Payer: Self-pay | Admitting: Gastroenterology

## 2022-03-07 LAB — SURGICAL PATHOLOGY

## 2022-03-12 ENCOUNTER — Encounter
Admission: EM | Disposition: A | Discharge status: Home / Self Care | Payer: Self-pay | Source: Home / Self Care | Attending: Internal Medicine

## 2022-03-12 ENCOUNTER — Inpatient Hospital Stay: Payer: Medicaid Other | Admitting: Anesthesiology

## 2022-03-12 ENCOUNTER — Inpatient Hospital Stay: Payer: Medicaid Other

## 2022-03-12 ENCOUNTER — Ambulatory Visit (INDEPENDENT_AMBULATORY_CARE_PROVIDER_SITE_OTHER): Payer: Medicaid Other | Admitting: Nurse Practitioner

## 2022-03-12 ENCOUNTER — Encounter: Payer: Self-pay | Admitting: Internal Medicine

## 2022-03-12 ENCOUNTER — Other Ambulatory Visit: Payer: Self-pay

## 2022-03-12 ENCOUNTER — Inpatient Hospital Stay
Admission: EM | Admit: 2022-03-12 | Discharge: 2022-03-13 | DRG: 378 | Disposition: A | Discharge status: Home / Self Care | Payer: Medicaid Other | Attending: Internal Medicine | Admitting: Internal Medicine

## 2022-03-12 ENCOUNTER — Encounter (INDEPENDENT_AMBULATORY_CARE_PROVIDER_SITE_OTHER): Payer: Medicaid Other

## 2022-03-12 DIAGNOSIS — K633 Ulcer of intestine: Secondary | ICD-10-CM | POA: Diagnosis present

## 2022-03-12 DIAGNOSIS — Z881 Allergy status to other antibiotic agents status: Secondary | ICD-10-CM

## 2022-03-12 DIAGNOSIS — K921 Melena: Secondary | ICD-10-CM | POA: Diagnosis present

## 2022-03-12 DIAGNOSIS — Z79899 Other long term (current) drug therapy: Secondary | ICD-10-CM

## 2022-03-12 DIAGNOSIS — Z7982 Long term (current) use of aspirin: Secondary | ICD-10-CM | POA: Diagnosis not present

## 2022-03-12 DIAGNOSIS — I10 Essential (primary) hypertension: Secondary | ICD-10-CM

## 2022-03-12 DIAGNOSIS — Z888 Allergy status to other drugs, medicaments and biological substances status: Secondary | ICD-10-CM

## 2022-03-12 DIAGNOSIS — E7800 Pure hypercholesterolemia, unspecified: Secondary | ICD-10-CM | POA: Diagnosis present

## 2022-03-12 DIAGNOSIS — J449 Chronic obstructive pulmonary disease, unspecified: Secondary | ICD-10-CM | POA: Diagnosis present

## 2022-03-12 DIAGNOSIS — K625 Hemorrhage of anus and rectum: Secondary | ICD-10-CM | POA: Diagnosis not present

## 2022-03-12 DIAGNOSIS — Z882 Allergy status to sulfonamides status: Secondary | ICD-10-CM | POA: Diagnosis not present

## 2022-03-12 DIAGNOSIS — K5731 Diverticulosis of large intestine without perforation or abscess with bleeding: Secondary | ICD-10-CM | POA: Diagnosis present

## 2022-03-12 DIAGNOSIS — K219 Gastro-esophageal reflux disease without esophagitis: Secondary | ICD-10-CM | POA: Diagnosis present

## 2022-03-12 DIAGNOSIS — K922 Gastrointestinal hemorrhage, unspecified: Secondary | ICD-10-CM

## 2022-03-12 DIAGNOSIS — F319 Bipolar disorder, unspecified: Secondary | ICD-10-CM | POA: Diagnosis present

## 2022-03-12 DIAGNOSIS — F419 Anxiety disorder, unspecified: Secondary | ICD-10-CM | POA: Diagnosis present

## 2022-03-12 DIAGNOSIS — Z85828 Personal history of other malignant neoplasm of skin: Secondary | ICD-10-CM

## 2022-03-12 DIAGNOSIS — K635 Polyp of colon: Secondary | ICD-10-CM

## 2022-03-12 DIAGNOSIS — Z8673 Personal history of transient ischemic attack (TIA), and cerebral infarction without residual deficits: Secondary | ICD-10-CM

## 2022-03-12 DIAGNOSIS — F17210 Nicotine dependence, cigarettes, uncomplicated: Secondary | ICD-10-CM | POA: Diagnosis present

## 2022-03-12 DIAGNOSIS — I959 Hypotension, unspecified: Secondary | ICD-10-CM | POA: Diagnosis present

## 2022-03-12 DIAGNOSIS — F17200 Nicotine dependence, unspecified, uncomplicated: Secondary | ICD-10-CM | POA: Diagnosis present

## 2022-03-12 DIAGNOSIS — K5730 Diverticulosis of large intestine without perforation or abscess without bleeding: Secondary | ICD-10-CM | POA: Diagnosis not present

## 2022-03-12 DIAGNOSIS — Z823 Family history of stroke: Secondary | ICD-10-CM

## 2022-03-12 HISTORY — PX: COLONOSCOPY WITH PROPOFOL: SHX5780

## 2022-03-12 LAB — COMPREHENSIVE METABOLIC PANEL
ALT: 22 U/L (ref 0–44)
AST: 20 U/L (ref 15–41)
Albumin: 3 g/dL — ABNORMAL LOW (ref 3.5–5.0)
Alkaline Phosphatase: 41 U/L (ref 38–126)
Anion gap: 6 (ref 5–15)
BUN: 20 mg/dL (ref 8–23)
CO2: 29 mmol/L (ref 22–32)
Calcium: 8.9 mg/dL (ref 8.9–10.3)
Chloride: 100 mmol/L (ref 98–111)
Creatinine, Ser: 0.5 mg/dL (ref 0.44–1.00)
GFR, Estimated: >60 mL/min (ref >60)
Glucose, Bld: 118 mg/dL — ABNORMAL HIGH (ref 70–99)
Potassium: 4.6 mmol/L (ref 3.5–5.1)
Sodium: 135 mmol/L (ref 135–145)
Total Bilirubin: 0.3 mg/dL (ref 0.3–1.2)
Total Protein: 5.6 g/dL — ABNORMAL LOW (ref 6.5–8.1)

## 2022-03-12 LAB — HEMOGLOBIN AND HEMATOCRIT, BLOOD
HCT: 32.8 % — ABNORMAL LOW (ref 36.0–46.0)
HCT: 30 % — ABNORMAL LOW (ref 36.0–46.0)
Hemoglobin: 10.9 g/dL — ABNORMAL LOW (ref 12.0–15.0)
Hemoglobin: 10 g/dL — ABNORMAL LOW (ref 12.0–15.0)

## 2022-03-12 LAB — CBC WITH DIFFERENTIAL/PLATELET
Abs Immature Granulocytes: 0.08 10*3/uL — ABNORMAL HIGH (ref 0.00–0.07)
Basophils Absolute: 0 10*3/uL (ref 0.0–0.1)
Basophils Relative: 0 %
Eosinophils Absolute: 0.2 10*3/uL (ref 0.0–0.5)
Eosinophils Relative: 3 %
HCT: 36.7 % (ref 36.0–46.0)
Hemoglobin: 12.1 g/dL (ref 12.0–15.0)
Immature Granulocytes: 1 %
Lymphocytes Relative: 20 %
Lymphs Abs: 1.2 10*3/uL (ref 0.7–4.0)
MCH: 29.4 pg (ref 26.0–34.0)
MCHC: 33 g/dL (ref 30.0–36.0)
MCV: 89.3 fL (ref 80.0–100.0)
Monocytes Absolute: 0.5 10*3/uL (ref 0.1–1.0)
Monocytes Relative: 8 %
Neutro Abs: 4.1 10*3/uL (ref 1.7–7.7)
Neutrophils Relative %: 68 %
Platelets: 216 10*3/uL (ref 150–400)
RBC: 4.11 MIL/uL (ref 3.87–5.11)
RDW: 12.6 % (ref 11.5–15.5)
WBC: 6.1 10*3/uL (ref 4.0–10.5)
nRBC: 0 % (ref 0.0–0.2)

## 2022-03-12 LAB — PROTIME-INR
INR: 1.1 (ref 0.8–1.2)
Prothrombin Time: 14 seconds (ref 11.4–15.2)

## 2022-03-12 LAB — MAGNESIUM: Magnesium: 1.7 mg/dL (ref 1.7–2.4)

## 2022-03-12 LAB — LACTIC ACID, PLASMA
Lactic Acid, Venous: 1.7 mmol/L (ref 0.5–1.9)
Lactic Acid, Venous: 1.2 mmol/L (ref 0.5–1.9)

## 2022-03-12 LAB — ABO/RH: ABO/RH(D): O POS

## 2022-03-12 SURGERY — COLONOSCOPY WITH PROPOFOL
Anesthesia: General

## 2022-03-12 MED ORDER — IOHEXOL 350 MG/ML SOLN
75.0000 mL | Freq: Once | INTRAVENOUS | Status: AC | PRN
  Administered 2022-03-12: 100 mL via INTRAVENOUS

## 2022-03-12 MED ORDER — ACETAMINOPHEN 325 MG RE SUPP: 650.0000 mg | Freq: Four times a day (QID) | RECTAL | Status: DC | PRN

## 2022-03-12 MED ORDER — SODIUM CHLORIDE 0.9 % IV SOLN: INTRAVENOUS | Status: DC

## 2022-03-12 MED ORDER — ONDANSETRON HCL 4 MG PO TABS: 4.0000 mg | ORAL_TABLET | Freq: Four times a day (QID) | ORAL | Status: DC | PRN

## 2022-03-12 MED ORDER — SODIUM CHLORIDE 0.9 % IV SOLN
10.0000 mL/h | Freq: Once | INTRAVENOUS | Status: AC
  Administered 2022-03-12: 10 mL/h via INTRAVENOUS

## 2022-03-12 MED ORDER — PEG 3350-KCL-NA BICARB-NACL 420 G PO SOLR
4000.0000 mL | Freq: Once | ORAL | Status: AC
Start: 2022-03-12 — End: 2022-03-12
  Administered 2022-03-12: 4000 mL via ORAL
  Filled 2022-03-12: qty 4000

## 2022-03-12 MED ORDER — PROPOFOL 500 MG/50ML IV EMUL
INTRAVENOUS | Status: DC | PRN
  Administered 2022-03-12: 150 ug/kg/min via INTRAVENOUS

## 2022-03-12 MED ORDER — ONDANSETRON HCL 4 MG/2ML IJ SOLN: 4.0000 mg | Freq: Four times a day (QID) | INTRAMUSCULAR | Status: DC | PRN

## 2022-03-12 MED ORDER — ACETAMINOPHEN 325 MG PO TABS: 650.0000 mg | ORAL_TABLET | Freq: Four times a day (QID) | ORAL | Status: DC | PRN

## 2022-03-12 MED ORDER — GUAIFENESIN 100 MG/5ML PO LIQD: 5.0000 mL | Freq: Once | ORAL | Status: DC

## 2022-03-12 MED ORDER — SODIUM CHLORIDE 0.9 % IV BOLUS
1000.0000 mL | Freq: Once | INTRAVENOUS | Status: AC
Start: 2022-03-12 — End: 2022-03-12
  Administered 2022-03-12: 1000 mL via INTRAVENOUS

## 2022-03-12 MED ORDER — PROPOFOL 10 MG/ML IV BOLUS
INTRAVENOUS | Status: DC | PRN
  Administered 2022-03-12: 20 mg via INTRAVENOUS
  Administered 2022-03-12: 50 mg via INTRAVENOUS

## 2022-03-12 MED ORDER — ESMOLOL HCL 100 MG/10ML IV SOLN
INTRAVENOUS | Status: DC | PRN
  Administered 2022-03-12 (×5): 20 ug via INTRAVENOUS
  Administered 2022-03-12 (×2): 30 ug via INTRAVENOUS

## 2022-03-12 MED ORDER — ALBUTEROL SULFATE (2.5 MG/3ML) 0.083% IN NEBU
3.0000 mL | INHALATION_SOLUTION | RESPIRATORY_TRACT | Status: DC | PRN
  Administered 2022-03-12 (×2): 3 mL via RESPIRATORY_TRACT
  Filled 2022-03-12 (×2): qty 3

## 2022-03-12 MED ORDER — SODIUM CHLORIDE 0.9 % IV SOLN
INTRAVENOUS | Status: DC
Start: 2022-03-12 — End: 2022-03-13

## 2022-03-12 MED ORDER — NICOTINE 21 MG/24HR TD PT24
21.0000 mg | MEDICATED_PATCH | Freq: Every day | TRANSDERMAL | Status: DC
  Administered 2022-03-12 – 2022-03-13 (×2): 21 mg via TRANSDERMAL
  Filled 2022-03-12 (×2): qty 1

## 2022-03-12 NOTE — Assessment & Plan Note (Signed)
Not acutely exacerbated ?Continue as needed bronchodilator therapy ?

## 2022-03-12 NOTE — Assessment & Plan Note (Signed)
Patient was hypotensive upon arrival to the ER We will hold atenolol and hydrochlorothiazide for now

## 2022-03-12 NOTE — Anesthesia Postprocedure Evaluation (Addendum)
Anesthesia Post Note  Patient: Seras Koons  Procedure(s) Performed: COLONOSCOPY WITH PROPOFOL  Patient location during evaluation: Endoscopy Anesthesia Type: General Level of consciousness: awake and alert Pain management: pain level controlled Vital Signs Assessment: post-procedure vital signs reviewed and stable Respiratory status: spontaneous breathing, nonlabored ventilation and respiratory function stable Cardiovascular status: blood pressure returned to baseline, stable and tachycardic Postop Assessment: no apparent nausea or vomiting Anesthetic complications: no   No notable events documented.   Last Vitals:  Vitals:   03/12/22 1550 03/12/22 1610  BP: (!) 125/35 (!) 139/40  Pulse: (!) 109 (!) 103  Resp: 17 (!) 24  Temp:    SpO2: 98% 97%    Last Pain:  Vitals:   03/12/22 1540  TempSrc: Temporal  PainSc: 0-No pain                 Foye Deer

## 2022-03-12 NOTE — Transfer of Care (Signed)
Immediate Anesthesia Transfer of Care Note  Patient: Sylvia Richardson  Procedure(s) Performed: COLONOSCOPY WITH PROPOFOL  Patient Location: PACU  Anesthesia Type:General  Level of Consciousness: awake and oriented  Airway & Oxygen Therapy: Patient Spontanous Breathing and Patient connected to nasal cannula oxygen  Post-op Assessment: Report given to RN and Post -op Vital signs reviewed and stable  Post vital signs: Reviewed and stable  Last Vitals:  Vitals Value Taken Time  BP 133/52 03/12/22 1540  Temp 35.6 C 03/12/22 1540  Pulse 116 03/12/22 1540  Resp 20 03/12/22 1540  SpO2 100 % 03/12/22 1540    Last Pain:  Vitals:   03/12/22 1540  TempSrc: Temporal  PainSc:          Complications: No notable events documented.

## 2022-03-12 NOTE — H&P (Addendum)
History and Physical    Patient: Sylvia Richardson GUY:[id number] DOB: [birth year] DOA: 03/12/2022 DOS: the patient was seen and examined on 03/12/2022 PCP: Orene Desanctis, MD  Patient coming from: Home  Chief Complaint:  Chief Complaint  Patient presents with   Rectal Bleeding    Pt arrived from home via EMS d/t rectal bleeding after having polyps removed during a colonoscopy last week.   HPI: Sylvia Richardson is a 66 y.o. female with medical history significant for bipolar disorder, GERD, nicotine dependence, anxiety disorder who presents to the ER via EMS for evaluation of rectal bleeding. Per patient and her mother that she had a colonoscopy done about a week ago polypectomy.  She states that she started bleeding about 4 days prior to presentation and describes passing large amounts of bright red blood with clots.  She complains of lower abdominal pain mostly in both lower quadrants which she rates an 8 x 10 in intensity at its worst.  She denies having any nausea, no vomiting, no hematemesis or passage of melena stools.  She complains of generalized weakness and fell on the morning of her admission but did not lose consciousness. She denies having any chest pain, no shortness of breath, no headache, no fever, no chills, no leg swelling, no focal deficit or blurred vision. She was hypotensive upon arrival to the ER with systolic blood pressure of .  While in the emergency room she had another episode of rectal bleeding and has been transfused 1 unit of packed RBC.      Review of Systems: As mentioned in the history of present illness. All other systems reviewed and are negative. Past Medical History:  Diagnosis Date   Anxiety    Bipolar disorder (HCC)    Cancer (HCC)    skin   Carotid artery disease (HCC)    Chronic cystitis    COPD (chronic obstructive pulmonary disease) (HCC)    Depression    Dysphagia    Epigastric pain    GERD (gastroesophageal reflux  disease)    H/O degenerative disc disease    Headache    Hypercholesteremia    Hypertension    Severe tobacco use disorder    Social phobia    Stroke [facility])    Past Surgical History:  Procedure Laterality Date   BREAST BIOPSY Left    core bx- neg   CESAREAN SECTION     CHOLECYSTECTOMY     COLONOSCOPY WITH PROPOFOL N/A 03/05/2022   Procedure: COLONOSCOPY WITH PROPOFOL;  Surgeon: Regis Bill, MD;  Location: [facility] ENDOSCOPY;  Service: Endoscopy;  Laterality: N/A;   ESOPHAGOGASTRODUODENOSCOPY     ESOPHAGOGASTRODUODENOSCOPY (EGD) WITH PROPOFOL N/A 03/05/2022   Procedure: ESOPHAGOGASTRODUODENOSCOPY (EGD) WITH PROPOFOL;  Surgeon: Regis Bill, MD;  Location: [facility] ENDOSCOPY;  Service: Endoscopy;  Laterality: N/A;   HEMORRHOIDECTOMY WITH HEMORRHOID BANDING     history colonic polyps     Laparoscopic repair paraesophageal hiatal hernia     Nissen Fundoplication     TONSILLECTOMY     TUBAL LIGATION     Social History:  reports that she has been smoking cigarettes. She has a 204.00 pack-year smoking history. She has never used smokeless tobacco. She reports that she does not drink alcohol and does not use drugs.  Allergies  Allergen Reactions   Bactrim [Sulfamethoxazole-Trimethoprim] Diarrhea   Ciprofloxacin Anaphylaxis   Clindamycin/Lincomycin Nausea And Vomiting   Zithromax [Azithromycin]     Abdomen pain and acid reflux    Choline Fenofibrate  Other reaction(s): Other (See Comments) Other Reaction: LEFT SIDE TONGUE SWELLING Other reaction(s): Other (See Comments) LEFT SIDE TONGUE SWELLING Other Reaction: LEFT SIDE TONGUE SWELLING   Doxycycline Nausea Only   Fish Oil Nausea Only   Levaquin [Levofloxacin] Rash   Niacin Diarrhea   Nifedipine Diarrhea   Rosuvastatin     Other reaction(s): Headache    Family History  Problem Relation Age of Onset   Prostate cancer Father    Stroke Maternal Grandfather    Stroke Paternal Grandfather    Breast cancer Neg Hx      Prior to Admission medications   Medication Sig Start Date End Date Taking? Authorizing Provider  aspirin EC 81 MG tablet Take 81 mg by mouth daily.  10/02/10  Yes [provider]  atenolol (TENORMIN) 100 MG tablet Take 100 mg by mouth daily.   Yes [provider]  clonazePAM (KLONOPIN) 0.5 MG tablet Take 0.5 mg by mouth 4 (four) times daily as needed for anxiety.   Yes [provider]  DALIRESP 500 MCG TABS tablet TAKE 1 TABLET BY MOUTH EVERY DAY(PLEASE KEEP APPT. IN JULY) 02/28/21  Yes Yevonne Pax, MD  DULoxetine (CYMBALTA) 60 MG capsule Take 120 mg by mouth daily.    Yes [provider]  esomeprazole (NEXIUM) 40 MG capsule Take 40 mg by mouth daily. 12/21/21  Yes [provider]  hydrochlorothiazide (HYDRODIURIL) 25 MG tablet Take 25 mg by mouth daily.   Yes [provider]  metoCLOPramide (REGLAN) 10 MG tablet Take 10 mg by mouth in the morning, at noon, in the evening, and at bedtime.   Yes [provider]  montelukast (SINGULAIR) 10 MG tablet Take 10 mg by mouth at bedtime.  02/26/14  Yes [provider]  omeprazole (PRILOSEC) 40 MG capsule Take 40 mg by mouth every evening. 03/06/22  Yes [provider]  predniSONE (DELTASONE) 10 MG tablet Take 10 mg by mouth daily.   Yes [provider]  traZODone (DESYREL) 100 MG tablet Take 100-200 mg by mouth at bedtime.   Yes [provider]  ipratropium-albuterol (DUONEB) 0.5-2.5 (3) MG/3ML SOLN Take 3 mLs by nebulization 4 (four) times daily as needed. Patient not taking: Reported on 03/12/2022 11/07/21   [provider]  PROVENTIL HFA 108 (90 Base) MCG/ACT inhaler INHALE 2 PUFFS INTO THE LUNGS EVERY 4 HOURS AS NEEDED FOR WHEEZING OR SHORTNESS OF BREATH 05/01/20   Yevonne Pax, MD  Vibegron (GEMTESA) 75 MG TABS Take 75 mg by mouth daily. Patient not taking: Reported on 03/12/2022 10/24/21   Vanna Scotland, MD    Physical Exam: Vitals:    03/12/22 0932 03/12/22 0947 03/12/22 1000 03/12/22 1030  BP: 100/81 (!) 114/58 105/66 108/69  Pulse: 87 83 (!) 164 90  Resp: 19 (!) 22 (!) 23 16  Temp: 97.9 F (36.6 C) 98.2 F (36.8 C)    TempSrc:  Oral    SpO2:  98% 94% 95%  Weight:      Height:       Physical Exam Vitals and nursing note reviewed.  Constitutional:      Comments: Chronically ill-appearing.  Very pale  HENT:     Head: Normocephalic and atraumatic.     Nose: Nose normal.     Mouth/Throat:     Mouth: Mucous membranes are moist.  Eyes:     Comments: Conjunctival pallor  Cardiovascular:     Rate and Rhythm: Normal rate and regular rhythm.  Pulmonary:  Effort: Pulmonary effort is normal.     Breath sounds: Normal breath sounds.  Abdominal:     General: Abdomen is flat. Bowel sounds are normal.     Palpations: Abdomen is soft.     Tenderness: There is abdominal tenderness.     Comments: Tender in both left and right lower quadrant as well as suprapubic area  Musculoskeletal:        General: Normal range of motion.     Cervical back: Normal range of motion and neck supple.  Skin:    General: Skin is warm and dry.  Neurological:     General: No focal deficit present.     Mental Status: She is alert and oriented to person, place, and time.  Psychiatric:        Mood and Affect: Mood normal.        Behavior: Behavior normal.     Data Reviewed: Relevant notes from primary care and specialist visits, past discharge summaries as available in EHR, including Care Everywhere. Prior diagnostic testing as pertinent to current admission diagnoses Updated medications and problem lists for reconciliation ED course, including vitals, labs, imaging, treatment and response to treatment Triage notes, nursing and pharmacy notes and ED provider's notes Notable results as noted in HPI Labs reviewed.  Sodium 135, potassium 4.6, chloride 100, bicarb 29, glucose 118, BUN 20, creatinine 0.50, calcium 8.9, total protein 5.6,  albumin 3.0, AST 20, ALT 22, alkaline phosphatase 41, total bilirubin 0.3, lactic acid 1.7, PT 14, INR 1.1, white count 6.1, hemoglobin 12.1, hematocrit 36.7, MCV 89.3, RDW 12.6, platelet count 216  There are no new results to review at this time.  Assessment and Plan: * Rectal bleeding Patient presents to the ER for evaluation of a 4-day history of rectal bleeding post colonoscopy with polypectomy. She complains of generalized weakness and is status post fall without loss of consciousness. She was hypotensive upon arrival to the ER and had another episode of rectal bleeding and has been transfused 1 unit of packed RBC Hold aspirin Check serial H&H Transfuse as needed GI consult  Nicotine dependence, unspecified, uncomplicated Smoking cessation has been discussed with patient in detail We will place patient on a nicotine transdermal patch 21 mg daily  Gastro-esophageal reflux disease without esophagitis Stable We will resume oral PPI  COPD (chronic obstructive pulmonary disease) (HCC) Not acutely exacerbated Continue as needed bronchodilator therapy  Hypertension Patient was hypotensive upon arrival to the ER We will hold atenolol and hydrochlorothiazide for now      Advance Care Planning:   Code Status: Full Code   Consults: Gastroenterology  Family Communication: Greater than 50% of time was spent discussing patient's condition and plan of care with her at the bedside.  All questions and concerns have been addressed.  She verbalizes understanding and agrees with the plan.  Severity of Illness: The appropriate patient status for this patient is OBSERVATION. Observation status is judged to be reasonable and necessary in order to provide the required intensity of service to ensure the patient's safety. The patient's presenting symptoms, physical exam findings, and initial radiographic and laboratory data in the context of their medical condition is felt to place them at  decreased risk for further clinical deterioration. Furthermore, it is anticipated that the patient will be medically stable for discharge from the hospital within 2 midnights of admission.   Author: Lucile Shutters, MD 03/12/2022 11:02 AM  For on call review [email or web address].

## 2022-03-12 NOTE — Assessment & Plan Note (Addendum)
Stable We will resume oral PPI

## 2022-03-12 NOTE — ED Triage Notes (Signed)
Pt arrived from home via EMS d/t rectal bleeding after having polyps removed during a colonoscopy last week.

## 2022-03-12 NOTE — Anesthesia Procedure Notes (Addendum)
Date/Time: 03/12/2022 2:39 PM  Performed by: Junious Silk, CRNAPre-anesthesia Checklist: Patient identified, Emergency Drugs available, Suction available, Patient being monitored and Timeout performed Oxygen Delivery Method: Simple face mask

## 2022-03-13 ENCOUNTER — Encounter: Payer: Self-pay | Admitting: Gastroenterology

## 2022-03-13 DIAGNOSIS — K625 Hemorrhage of anus and rectum: Secondary | ICD-10-CM | POA: Diagnosis not present

## 2022-03-13 LAB — TYPE AND SCREEN
ABO/RH(D): O POS
Antibody Screen: NEGATIVE
Unit division: 0
Unit division: 0
Unit division: 0
Unit division: 0

## 2022-03-13 LAB — URINALYSIS, COMPLETE (UACMP) WITH MICROSCOPIC
Bacteria, UA: NONE SEEN
Bilirubin Urine: NEGATIVE
Glucose, UA: NEGATIVE mg/dL
Ketones, ur: NEGATIVE mg/dL
Leukocytes,Ua: NEGATIVE
Nitrite: NEGATIVE
Protein, ur: NEGATIVE mg/dL
Specific Gravity, Urine: 1.02 (ref 1.005–1.030)
pH: 5 (ref 5.0–8.0)

## 2022-03-13 LAB — BPAM RBC
Blood Product Expiration Date: 202307262359
Blood Product Expiration Date: 202307262359
Blood Product Expiration Date: 202307262359
Blood Product Expiration Date: 202307292359
ISSUE DATE / TIME: 202306270926
ISSUE DATE / TIME: 202306271313
Unit Type and Rh: 5100
Unit Type and Rh: 5100
Unit Type and Rh: 5100
Unit Type and Rh: 5100

## 2022-03-13 LAB — HEMOGLOBIN AND HEMATOCRIT, BLOOD
HCT: 28.7 % — ABNORMAL LOW (ref 36.0–46.0)
Hemoglobin: 9.6 g/dL — ABNORMAL LOW (ref 12.0–15.0)

## 2022-03-13 LAB — PREPARE RBC (CROSSMATCH)

## 2022-03-13 MED ORDER — MONTELUKAST SODIUM 10 MG PO TABS: 10.0000 mg | ORAL_TABLET | Freq: Every day | ORAL | Status: DC

## 2022-03-13 MED ORDER — FERROUS SULFATE 325 (65 FE) MG PO TABS: 325.0000 mg | ORAL_TABLET | Freq: Every day | ORAL | 3 refills | Status: DC

## 2022-03-13 MED ORDER — DULOXETINE HCL 60 MG PO CPEP
120.0000 mg | ORAL_CAPSULE | Freq: Every day | ORAL | Status: DC
  Administered 2022-03-13: 120 mg via ORAL
  Filled 2022-03-13: qty 2

## 2022-03-13 MED ORDER — ATENOLOL 50 MG PO TABS
100.0000 mg | ORAL_TABLET | Freq: Every day | ORAL | Status: DC
  Administered 2022-03-13: 100 mg via ORAL
  Filled 2022-03-13 (×2): qty 2

## 2022-03-13 MED ORDER — FERROUS SULFATE 325 (65 FE) MG PO TABS
325.0000 mg | ORAL_TABLET | Freq: Every day | ORAL | Status: DC
  Administered 2022-03-13: 325 mg via ORAL
  Filled 2022-03-13: qty 1

## 2022-03-13 MED ORDER — CLONAZEPAM 0.5 MG PO TABS: 0.5000 mg | ORAL_TABLET | Freq: Four times a day (QID) | ORAL | Status: DC | PRN

## 2022-03-13 NOTE — ED Notes (Signed)
Patient incontinent of urine. Pericare performed and dry brief applied. No bloody BM. No distress noted.

## 2022-03-13 NOTE — ED Notes (Signed)
Patient incontinent of urine. Pericare performed and dry brief applied. No bloody bowel movements through the night.

## 2022-03-13 NOTE — Hospital Course (Addendum)
Taken from H&P.   Sylvia Richardson is a 65 y.o. female with medical history significant for bipolar disorder, GERD, nicotine dependence, anxiety disorder who presents to the ER via EMS for evaluation of rectal bleeding. Per patient and her mother that she had a colonoscopy done about a week ago polypectomy.  She states that she started bleeding about 4 days prior to presentation and describes passing large amounts of bright red blood with clots.  She complains of lower abdominal pain mostly in both lower quadrants which she rates an 8 x 10 in intensity at its worst.  She denies having any nausea, no vomiting, no hematemesis or passage of melena stools.  She complains of generalized weakness and fell on the morning of her admission but did not lose consciousness. She denies having any chest pain, no shortness of breath, no headache, no fever, no chills, no leg swelling, no focal deficit or blurred vision. She was hypotensive upon arrival to the ER with systolic blood pressure of 172mHg.  While in the emergency room she had another episode of rectal bleeding and has been transfused 1 unit of packed RBC.  6/28: GI was consulted and patient underwent another colonoscopy and found to have large amount of clots and blood throughout the colon.  Multiple polypectomy sites were seen with clips still in place.  More clips were placed around the area of the previous clips without any active bleeding seen.   If the patient should have any further sign of GI bleeding then IR should be consult it for embolization.   No overnight bleeding per rectum.  Hemoglobin at 9.6 this morning. Blood pressure started trending up, restarting home [place].  Patient remained stable, able to tolerate diet without any difficulty.  No more bleeding.  She was started on iron supplement and need to have a close follow-up with her gastroenterologist.  She will continue on current medication and will follow-up with her  providers.

## 2022-03-13 NOTE — Discharge Summary (Signed)
Physician Discharge Summary   Patient: Sylvia Richardson MRN: [id number] DOB: [birth year]  Admit date:     03/12/2022  Discharge date: 03/13/22  Discharge Physician: Lorella Nimrod   PCP: Barbaraann Boys, MD   Recommendations at discharge:  Please obtain CBC and BMP in 1 week Follow-up with gastroenterology in 1 week Follow-up with primary care provider  Discharge Diagnoses: Principal Problem:   Rectal bleeding Active Problems:   Hypertension   COPD (chronic obstructive pulmonary disease) ([place])   Gastro-esophageal reflux disease without esophagitis   Nicotine dependence, unspecified, uncomplicated   Lower GI bleed   Hospital Course: Taken from H&P.   Sylvia Richardson is a 66 y.o. female with medical history significant for bipolar disorder, GERD, nicotine dependence, anxiety disorder who presents to the ER via EMS for evaluation of rectal bleeding. Per patient and her mother that she had a colonoscopy done about a week ago polypectomy.  She states that she started bleeding about 4 days prior to presentation and describes passing large amounts of bright red blood with clots.  She complains of lower abdominal pain mostly in both lower quadrants which she rates an 8 x 10 in intensity at its worst.  She denies having any nausea, no vomiting, no hematemesis or passage of melena stools.  She complains of generalized weakness and fell on the morning of her admission but did not lose consciousness. She denies having any chest pain, no shortness of breath, no headache, no fever, no chills, no leg swelling, no focal deficit or blurred vision. She was hypotensive upon arrival to the ER with systolic blood pressure of 141mHg.  While in the emergency room she had another episode of rectal bleeding and has been transfused 1 unit of packed RBC.  Patient also had CT angio of her abdomen and there was no obvious source of bleeding found.  6/28: GI was consulted and patient underwent another  colonoscopy and found to have large amount of clots and blood throughout the colon.  Multiple polypectomy sites were seen with clips still in place.  More clips were placed around the area of the previous clips without any active bleeding seen.   If the patient should have any further sign of GI bleeding then IR should be consult it for embolization.   No overnight bleeding per rectum.  Hemoglobin at 9.6 this morning. Blood pressure started trending up, restarting home Ten[place]in.  Patient remained stable, able to tolerate diet without any difficulty.  No more bleeding.  She was started on iron supplement and need to have a close follow-up with her gastroenterologist.  She will continue on current medication and will follow-up with her providers.  Assessment and Plan: * Rectal bleeding Patient presents to the ER for evaluation of a 4-day history of rectal bleeding post colonoscopy with polypectomy. She complains of generalized weakness and is status post fall without loss of consciousness. She was hypotensive upon arrival to the ER and had another episode of rectal bleeding and has been transfused 1 unit of packed RBC Hold aspirin Check serial H&H Transfuse as needed GI consult  Nicotine dependence, unspecified, uncomplicated Smoking cessation has been discussed with patient in detail We will place patient on a nicotine transdermal patch 21 mg daily  Gastro-esophageal reflux disease without esophagitis Stable We will resume oral PPI  COPD (chronic obstructive pulmonary disease) ([place] Not acutely exacerbated Continue as needed bronchodilator therapy  Hypertension Patient was hypotensive upon arrival to the ER We will hold atenolol and hydrochlorothiazide  for now   Consultants: Gastroenterology Procedures performed: Colonoscopy Disposition: Home Diet recommendation:  Discharge Diet Orders (From admission, onward)     Start     Ordered   03/13/22 0000  Diet - low sodium heart  healthy        03/13/22 1429           Cardiac diet DISCHARGE MEDICATION: Allergies as of 03/13/2022       Reactions   Bactrim [sulfamethoxazole-trimethoprim] Diarrhea   Ciprofloxacin Anaphylaxis   Clindamycin/lincomycin Nausea And Vomiting   Zithromax [azithromycin]    Abdomen pain and acid reflux    Choline Fenofibrate    Other reaction(s): Other (See Comments) Other Reaction: LEFT SIDE TONGUE SWELLING Other reaction(s): Other (See Comments) LEFT SIDE TONGUE SWELLING Other Reaction: LEFT SIDE TONGUE SWELLING   Doxycycline Nausea Only   Fish Oil Nausea Only   Levaquin [levofloxacin] Rash   Niacin Diarrhea   Nifedipine Diarrhea   Rosuvastatin    Other reaction(s): Headache        Medication List     STOP taking these medications    esomeprazole 40 MG capsule Commonly known as: NEXIUM   Gemtesa 75 MG Tabs Generic drug: Vibegron       TAKE these medications    aspirin EC 81 MG tablet Take 81 mg by mouth daily.   atenolol 100 MG tablet Commonly known as: TENORMIN Take 100 mg by mouth daily.   clonazePAM 0.5 MG tablet Commonly known as: KLONOPIN Take 0.5 mg by mouth 4 (four) times daily as needed for anxiety.   Daliresp 500 MCG Tabs tablet Generic drug: roflumilast TAKE 1 TABLET BY MOUTH EVERY DAY(PLEASE KEEP APPT. IN [place])   DULoxetine 60 MG capsule Commonly known as: CYMBALTA Take 120 mg by mouth daily.   ferrous sulfate 325 (65 FE) MG tablet Take 1 tablet (325 mg total) by mouth daily with breakfast. Start taking on: March 14, 2022   hydrochlorothiazide 25 MG tablet Commonly known as: HYDRODIURIL Take 25 mg by mouth daily.   ipratropium-albuterol 0.5-2.5 (3) MG/3ML Soln Commonly known as: DUONEB Take 3 mLs by nebulization 4 (four) times daily as needed.   metoCLOPramide 10 MG tablet Commonly known as: REGLAN Take 10 mg by mouth in the morning, at noon, in the evening, and at bedtime.   montelukast 10 MG tablet Commonly known as:  SINGULAIR Take 10 mg by mouth at bedtime.   omeprazole 40 MG capsule Commonly known as: PRILOSEC Take 40 mg by mouth every evening.   predniSONE 10 MG tablet Commonly known as: DELTASONE Take 10 mg by mouth daily.   Proventil HFA 108 (90 Base) MCG/ACT inhaler Generic drug: albuterol INHALE 2 PUFFS INTO THE LUNGS EVERY 4 HOURS AS NEEDED FOR WHEEZING OR SHORTNESS OF BREATH   traZODone 100 MG tablet Commonly known as: DESYREL Take 100-200 mg by mouth at bedtime.        Follow-up Information     Barbaraann Boys, MD. Schedule an appointment as soon as possible for a visit in 1 week(s).   Specialty: Pediatrics Contact information: [address] [place] [address] [phone]         Lesly Rubenstein, MD. Schedule an appointment as soon as possible for a visit in 1 week(s).   Specialty: Gastroenterology Contact information: [address] [place] [address] [phone]                Discharge Exam: Danley Danker Weights   03/12/22 0751 03/12/22 1407  Weight: 58.1 kg 58.1 kg   General.     In no acute distress. Pulmonary.  Lungs clear bilaterally, normal respiratory effort. CV.  Regular rate and rhythm, no JVD, rub or murmur. Abdomen.  Soft, nontender, nondistended, BS positive. CNS.  Alert and oriented .  No focal neurologic deficit. Extremities.  No edema, no cyanosis, pulses intact and symmetrical. Psychiatry.  Judgment and insight appears normal.   Condition at discharge: stable  The results of significant diagnostics from this hospitalization (including imaging, microbiology, ancillary and laboratory) are listed below for reference.   Imaging Studies: CT ANGIO GI BLEED  Result Date: 03/12/2022 CLINICAL DATA:  LOWER GI bleeding. Rectal bleeding after having polyps removed during colonoscopy last week. EXAM: CTA ABDOMEN AND PELVIS WITHOUT AND WITH CONTRAST TECHNIQUE: Multidetector CT imaging of the abdomen and pelvis was performed using the  standard protocol during bolus administration of intravenous contrast. Multiplanar reconstructed images and MIPs were obtained and reviewed to evaluate the vascular anatomy. RADIATION DOSE REDUCTION: This exam was performed according to the departmental dose-optimization program which includes automated exposure control, adjustment of the mA and/or kV according to patient size and/or use of iterative reconstruction technique. CONTRAST:  One hundred OMNIPAQUE IOHEXOL 350 MG/ML SOLN COMPARISON:  08/23/2021 FINDINGS: VASCULAR Aorta: There is dense atherosclerotic calcification of the abdominal aorta. Focal, irregular plaque identified throughout the course of the aorta. There is significant atherosclerosis of the iliac arteries bilaterally. No stenosis or occlusion. Celiac: Patent without evidence of aneurysm, dissection, vasculitis or significant stenosis. SMA: Patent without evidence of aneurysm, dissection, vasculitis or significant stenosis. Renals: Patient has 2 LEFT renal arteries and a single RIGHT renal artery. Renal arteries are patent without evidence of aneurysm, dissection, vasculitis, fibromuscular dysplasia or significant stenosis. IMA: Patent without evidence of aneurysm, dissection, vasculitis or significant stenosis. Inflow: Patent without evidence of aneurysm, dissection, vasculitis or significant stenosis. Proximal Outflow: Bilateral common femoral and visualized portions of the superficial and profunda femoral arteries are patent without evidence of aneurysm, dissection, vasculitis or significant stenosis. Veins: No obvious venous abnormality within the limitations of this arterial phase study. Review of the MIP images confirms the above findings. NON-VASCULAR Lower chest: Minimal dependent atelectasis at the RIGHT lung base. Significant atherosclerotic calcification of the coronary arteries. Heart size is normal. No pericardial effusion. Hepatobiliary: Prior cholecystectomy. Benign cyst in the dome  of the RIGHT hepatic lobe. Pancreas: Unremarkable. No pancreatic ductal dilatation or surrounding inflammatory changes. Spleen: Normal in size without focal abnormality. Adrenals/Urinary Tract: Adrenal glands are normal. Focal scarring in the midpole regions bilaterally. Benign renal cysts, largest measuring approximate 1.6 centimeters. There is a nonobstructing RIGHT renal calculus measuring 4 millimeters in the midpole region. No hydronephrosis. Ureters are unremarkable. The bladder and visualized portion of the urethra are normal. Stomach/Bowel: Surgical clips are identified the gastroesophageal region. Stomach is otherwise normal in appearance. Small bowel loops are normal. Surgical clips are seen the region of the rectum. The colon is otherwise normal in appearance. Surgical clips are present in the region of the appendix. Following administration of contrast, there is no blood pooling or other identified cause for GI bleeding. Lymphatic: No significant vascular findings are present. No enlarged abdominal or pelvic lymph nodes. Reproductive: Uterus and bilateral adnexa are unremarkable. Other: Focal areas of calcified fat necrosis identified within the mesentery. No ascites. Musculoskeletal: No acute or significant osseous findings. IMPRESSION: VASCULAR 1. Dense atherosclerotic calcification of the abdominal aorta, without significant stenosis or occlusion. 2.  Aortic atherosclerosis.  (ICD10-I70.0) 3.  Coronary artery calcifications. NON-VASCULAR 1. Cholecystectomy. 2. Nephrolithiasis without evidence for obstructing stones. 3. Followixng administration of contrast, no pooling of contrast or other identified source of the GI hemorrhage. Electronically Signed   By: Nolon Nations M.D.   On: 03/12/2022 18:50    Microbiology: Results for orders placed or performed during the hospital encounter of 01/08/22  Resp Panel by RT-PCR (Flu A&B, Covid) Nasopharyngeal Swab     Status: None   Collection Time: 01/07/22  11:01 PM   Specimen: Nasopharyngeal Swab; Nasopharyngeal(NP) swabs in vial transport medium  Result Value Ref Range Status   SARS Coronavirus 2 by RT PCR NEGATIVE NEGATIVE Final    Comment: (NOTE) SARS-CoV-2 target nucleic acids are NOT DETECTED.  The SARS-CoV-2 RNA is generally detectable in upper respiratory specimens during the acute phase of infection. The lowest concentration of SARS-CoV-2 viral copies this assay can detect is 138 copies/mL. A negative result does not preclude SARS-Cov-2 infection and should not be used as the sole basis for treatment or other patient management decisions. A negative result may occur with  improper specimen collection/handling, submission of specimen other than nasopharyngeal swab, presence of viral mutation(s) within the areas targeted by this assay, and inadequate number of viral copies(<138 copies/mL). A negative result must be combined with clinical observations, patient history, and epidemiological information. The expected result is Negative.  Fact Sheet for Patients:  EntrepreneurPulse.com.au  Fact Sheet for Healthcare Providers:  IncredibleEmployment.be  This test is no t yet approved or cleared by the [place] FDA and  has been authorized for detection and/or diagnosis of SARS-CoV-2 by FDA under an Emergency Use Authorization (EUA). This EUA will remain  in effect (meaning this test can be used) for the duration of the COVID-19 declaration under Section 564(b)(1) of the Act, 21 U.S.C.section 360bbb-3(b)(1), unless the authorization is terminated  or revoked sooner.       Influenza A by PCR NEGATIVE NEGATIVE Final   Influenza B by PCR NEGATIVE NEGATIVE Final    Comment: (NOTE) The Xpert Xpress SARS-CoV-2/FLU/RSV plus assay is intended as an aid in the diagnosis of influenza from Nasopharyngeal swab specimens and should not be used as a sole basis for treatment. Nasal washings and aspirates  are unacceptable for Xpert Xpress SARS-CoV-2/FLU/RSV testing.  Fact Sheet for Patients: EntrepreneurPulse.com.au  Fact Sheet for Healthcare Providers: IncredibleEmployment.be  This test is not yet approved or cleared by the [place] FDA and has been authorized for detection and/or diagnosis of SARS-CoV-2 by FDA under an Emergency Use Authorization (EUA). This EUA will remain in effect (meaning this test can be used) for the duration of the COVID-19 declaration under Section 564(b)(1) of the Act, 21 U.S.C. section 360bbb-3(b)(1), unless the authorization is terminated or revoked.  Performed at [facility], [place], [place] [address]   Group A Strep by PCR [facility] Only)     Status: None   Collection Time: 01/07/22 11:01 PM   Specimen: Throat; Sterile Swab  Result Value Ref Range Status   Group A Strep by PCR NOT DETECTED NOT DETECTED Final    Comment: Performed at [facility], [address] [place], [place] [address]    Labs: CBC: Recent Labs  Lab 03/12/22 0753 03/12/22 1750 03/12/22 2244 03/13/22 0236  WBC 6.1  --   --   --   NEUTROABS 4.1  --   --   --   HGB 12.1 10.9* 10.0* 9.6*  HCT 36.7 32.8* 30.0* 28.7*  MCV 89.3  --   --   --   PLT 216  --   --   --    Basic Metabolic Panel: Recent Labs  Lab 03/12/22 0753  NA 135  K 4.6  CL 100  CO2 29  GLUCOSE 118*  BUN 20  CREATININE 0.50  CALCIUM 8.9  MG 1.7   Liver Function Tests: Recent Labs  Lab 03/12/22 0753  AST 20  ALT 22  ALKPHOS 41  BILITOT 0.3  PROT 5.6*  ALBUMIN 3.0*   CBG: No results for input(s): "GLUCAP" in the last 168 hours.  Discharge time spent: greater than 30 minutes.  This record has been created using Systems analyst. Errors have been sought and corrected,but may not always be located. Such creation errors do not reflect on the standard of care.   Signed: Lorella Nimrod, MD [facility] 03/13/2022

## 2022-03-19 NOTE — Progress Notes (Incomplete)
03/19/22 8:21 PM   Sylvia Richardson [birth year] [id number]  Referring provider:  Barbaraann Boys, [place],  [place] [address] No chief complaint on file.     HPI: Sylvia Richardson is a 66 y.o.female with a personal histoy of OAB/urge incontinence and chronic constipation who presents today for a 1 month follow-up with UA and PVR for urinary symptoms.   She had a Foley catheter placed for presumed retention during ED visit in 08/2021; CT abdomen and pelvis on that visualized Signs of renal cortical scarring. Nephrolithiasis on the RIGHT with a 4 mm calculus. Renal cysts are present bilaterally. Urinary bladder is moderately distended extending into the low abdomen/upper pelvis.    PMH: Past Medical History:  Diagnosis Date   Anxiety    Bipolar disorder ([place])    Cancer ([place])    skin   Carotid artery disease ([place])    Chronic cystitis    COPD (chronic obstructive pulmonary disease) ([facility])    Depression    Dysphagia    Epigastric pain    GERD (gastroesophageal reflux disease)    H/O degenerative disc disease    Headache    Hypercholesteremia    Hypertension    Severe tobacco use disorder    Social phobia    Stroke [facility])     Surgical History: Past Surgical History:  Procedure Laterality Date   BREAST BIOPSY Left    core bx- neg   CESAREAN SECTION     CHOLECYSTECTOMY     COLONOSCOPY WITH PROPOFOL N/A 03/05/2022   Procedure: COLONOSCOPY WITH PROPOFOL;  Surgeon: Lesly Rubenstein, MD;  Location: [facility] ENDOSCOPY;  Service: Endoscopy;  Laterality: N/A;   COLONOSCOPY WITH PROPOFOL N/A 03/12/2022   Procedure: COLONOSCOPY WITH PROPOFOL;  Surgeon: Lucilla Lame, MD;  Location: [facility] ENDOSCOPY;  Service: Endoscopy;  Laterality: N/A;   ESOPHAGOGASTRODUODENOSCOPY     ESOPHAGOGASTRODUODENOSCOPY (EGD) WITH PROPOFOL N/A 03/05/2022   Procedure: ESOPHAGOGASTRODUODENOSCOPY (EGD) WITH PROPOFOL;  Surgeon: Lesly Rubenstein, MD;  Location: [facility] ENDOSCOPY;   Service: Endoscopy;  Laterality: N/A;   HEMORRHOIDECTOMY WITH HEMORRHOID BANDING     history colonic polyps     Laparoscopic repair paraesophageal hiatal hernia     Nissen Fundoplication     TONSILLECTOMY     TUBAL LIGATION      Home Medications:  Allergies as of 03/20/2022       Reactions   Bactrim [sulfamethoxazole-trimethoprim] Diarrhea   Ciprofloxacin Anaphylaxis   Clindamycin/lincomycin Nausea And Vomiting   Zithromax [azithromycin]    Abdomen pain and acid reflux    Choline Fenofibrate    Other reaction(s): Other (See Comments) Other Reaction: LEFT SIDE TONGUE SWELLING Other reaction(s): Other (See Comments) LEFT SIDE TONGUE SWELLING Other Reaction: LEFT SIDE TONGUE SWELLING   Doxycycline Nausea Only   Fish Oil Nausea Only   Levaquin [levofloxacin] Rash   Niacin Diarrhea   Nifedipine Diarrhea   Rosuvastatin    Other reaction(s): Headache        Medication List        Accurate as of March 19, 2022  8:21 PM. If you have any questions, ask your nurse or doctor.          aspirin EC 81 MG tablet Take 81 mg by mouth daily.   atenolol 100 MG tablet Commonly known as: TENORMIN Take 100 mg by mouth daily.   clonazePAM 0.5 MG tablet Commonly known as: KLONOPIN Take 0.5 mg by mouth 4 (four) times daily as needed for anxiety.  Daliresp 500 MCG Tabs tablet Generic drug: roflumilast TAKE 1 TABLET BY MOUTH EVERY DAY(PLEASE KEEP APPT. IN [place])   DULoxetine 60 MG capsule Commonly known as: CYMBALTA Take 120 mg by mouth daily.   ferrous sulfate 325 (65 FE) MG tablet Take 1 tablet (325 mg total) by mouth daily with breakfast.   hydrochlorothiazide 25 MG tablet Commonly known as: HYDRODIURIL Take 25 mg by mouth daily.   ipratropium-albuterol 0.5-2.5 (3) MG/3ML Soln Commonly known as: DUONEB Take 3 mLs by nebulization 4 (four) times daily as needed.   metoCLOPramide 10 MG tablet Commonly known as: REGLAN Take 10 mg by mouth in the morning, at noon, in the  evening, and at bedtime.   montelukast 10 MG tablet Commonly known as: SINGULAIR Take 10 mg by mouth at bedtime.   omeprazole 40 MG capsule Commonly known as: PRILOSEC Take 40 mg by mouth every evening.   predniSONE 10 MG tablet Commonly known as: DELTASONE Take 10 mg by mouth daily.   Proventil HFA 108 (90 Base) MCG/ACT inhaler Generic drug: albuterol INHALE 2 PUFFS INTO THE LUNGS EVERY 4 HOURS AS NEEDED FOR WHEEZING OR SHORTNESS OF BREATH   traZODone 100 MG tablet Commonly known as: DESYREL Take 100-200 mg by mouth at bedtime.        Allergies:  Allergies  Allergen Reactions   Bactrim [Sulfamethoxazole-Trimethoprim] Diarrhea   Ciprofloxacin Anaphylaxis   Clindamycin/Lincomycin Nausea And Vomiting   Zithromax [Azithromycin]     Abdomen pain and acid reflux    Choline Fenofibrate     Other reaction(s): Other (See Comments) Other Reaction: LEFT SIDE TONGUE SWELLING Other reaction(s): Other (See Comments) LEFT SIDE TONGUE SWELLING Other Reaction: LEFT SIDE TONGUE SWELLING   Doxycycline Nausea Only   Fish Oil Nausea Only   Levaquin [Levofloxacin] Rash   Niacin Diarrhea   Nifedipine Diarrhea   Rosuvastatin     Other reaction(s): Headache    Family History: Family History  Problem Relation Age of Onset   Prostate cancer Father    Stroke Maternal Grandfather    Stroke Paternal Grandfather    Breast cancer Neg Hx     Social History:  reports that she has been smoking cigarettes. She has a 204.00 pack-year smoking history. She has never used smokeless tobacco. She reports that she does not drink alcohol and does not use drugs.   Physical Exam: There were no vitals taken for this visit.  Constitutional:  Alert and oriented, No acute distress. HEENT: [place] AT, moist mucus membranes.  Trachea midline, no masses. Cardiovascular: No clubbing, cyanosis, or edema. Respiratory: Normal respiratory effort, no increased work of breathing. Skin: No rashes, bruises or  suspicious lesions. Neurologic: Grossly intact, no focal deficits, moving all 4 extremities. Psychiatric: Normal mood and affect.  Laboratory Data:  Lab Results  Component Value Date   CREATININE 0.50 03/12/2022   Lab Results  Component Value Date   HGBA1C 5.6 03/19/2012    Urinalysis   Pertinent Imaging: No results found for any visits on 03/20/22.    Assessment & Plan:     No follow-ups on file.    [facility] [address] [place], [place] [address] [phone]

## 2022-03-20 ENCOUNTER — Other Ambulatory Visit: Payer: Self-pay

## 2022-03-20 ENCOUNTER — Emergency Department
Admission: EM | Admit: 2022-03-20 | Discharge: 2022-03-20 | Discharge status: Against Medical Advice | Payer: Medicaid Other | Attending: Emergency Medicine | Admitting: Emergency Medicine

## 2022-03-20 ENCOUNTER — Ambulatory Visit: Payer: Medicaid Other | Admitting: Urology

## 2022-03-20 ENCOUNTER — Emergency Department: Payer: Medicaid Other

## 2022-03-20 DIAGNOSIS — I2510 Atherosclerotic heart disease of native coronary artery without angina pectoris: Secondary | ICD-10-CM | POA: Insufficient documentation

## 2022-03-20 DIAGNOSIS — R110 Nausea: Secondary | ICD-10-CM | POA: Insufficient documentation

## 2022-03-20 DIAGNOSIS — Z5321 Procedure and treatment not carried out due to patient leaving prior to being seen by health care provider: Secondary | ICD-10-CM | POA: Insufficient documentation

## 2022-03-20 DIAGNOSIS — R0789 Other chest pain: Secondary | ICD-10-CM | POA: Diagnosis present

## 2022-03-20 LAB — BASIC METABOLIC PANEL
Anion gap: 8 (ref 5–15)
BUN: 10 mg/dL (ref 8–23)
CO2: 29 mmol/L (ref 22–32)
Calcium: 9.4 mg/dL (ref 8.9–10.3)
Chloride: 94 mmol/L — ABNORMAL LOW (ref 98–111)
Creatinine, Ser: 0.47 mg/dL (ref 0.44–1.00)
GFR, Estimated: >60 mL/min (ref >60)
Glucose, Bld: 102 mg/dL — ABNORMAL HIGH (ref 70–99)
Potassium: 4.3 mmol/L (ref 3.5–5.1)
Sodium: 131 mmol/L — ABNORMAL LOW (ref 135–145)

## 2022-03-20 LAB — CBC
HCT: 32 % — ABNORMAL LOW (ref 36.0–46.0)
Hemoglobin: 10.6 g/dL — ABNORMAL LOW (ref 12.0–15.0)
MCH: 29.6 pg (ref 26.0–34.0)
MCHC: 33.1 g/dL (ref 30.0–36.0)
MCV: 89.4 fL (ref 80.0–100.0)
Platelets: 326 10*3/uL (ref 150–400)
RBC: 3.58 MIL/uL — ABNORMAL LOW (ref 3.87–5.11)
RDW: 13 % (ref 11.5–15.5)
WBC: 5.4 10*3/uL (ref 4.0–10.5)
nRBC: 0 % (ref 0.0–0.2)

## 2022-03-20 LAB — TROPONIN I (HIGH SENSITIVITY)
Troponin I (High Sensitivity): 4 ng/L (ref <18)
Troponin I (High Sensitivity): 4 ng/L (ref <18)

## 2022-03-20 NOTE — ED Provider Triage Note (Signed)
Emergency Medicine Provider Triage Evaluation Note  Sylvia Richardson , a 66 y.o. female  was evaluated in triage.  Pt complains of chest pressure and nausea since 8am. Sitting at the kitchen table when it started. Still nauseated, but feels improved after EMS. History of valvular problems and CAD. No history of blood clots   Review of Systems  Positive: CP/nausea Negative: SOB/diaphoresis, leg swelling  Physical Exam  BP 139/64 (BP Location: Right Arm)   Pulse 79   Temp 98.6 F (37 C) (Oral)   Resp 18   SpO2 93%  Gen:   Awake, no distress   Resp:  Normal effort  MSK:   Moves extremities without difficulty  Other:    Medical Decision Making  Medically screening exam initiated at 11:33 AM.  Appropriate orders placed.  Sylvia Richardson was informed that the remainder of the evaluation will be completed by another provider, this initial triage assessment does not replace that evaluation, and the importance of remaining in the ED until their evaluation is complete.     Marquette Old, PA-C 03/20/22 1136

## 2022-03-20 NOTE — ED Triage Notes (Signed)
Pt comes into the ED via EMS from home with c/o chest pain/pressure  today intermittent with nausea, denies SOB, diaphoresis.  Nitro x1 SL ASA '324mg'$  Zofran '4mg'$  IV [id number] 137/65 96%RA HR79 #20gLAC

## 2022-03-21 ENCOUNTER — Emergency Department: Payer: Medicaid Other

## 2022-03-21 ENCOUNTER — Encounter: Payer: Self-pay | Admitting: *Deleted

## 2022-03-21 ENCOUNTER — Emergency Department
Admission: EM | Admit: 2022-03-21 | Discharge: 2022-03-21 | Discharge status: Against Medical Advice | Payer: Medicaid Other | Attending: Emergency Medicine | Admitting: Emergency Medicine

## 2022-03-21 ENCOUNTER — Other Ambulatory Visit: Payer: Self-pay

## 2022-03-21 DIAGNOSIS — R531 Weakness: Secondary | ICD-10-CM | POA: Diagnosis present

## 2022-03-21 DIAGNOSIS — J449 Chronic obstructive pulmonary disease, unspecified: Secondary | ICD-10-CM | POA: Insufficient documentation

## 2022-03-21 DIAGNOSIS — R0602 Shortness of breath: Secondary | ICD-10-CM | POA: Insufficient documentation

## 2022-03-21 LAB — CBC
HCT: 31.8 % — ABNORMAL LOW (ref 36.0–46.0)
Hemoglobin: 10.5 g/dL — ABNORMAL LOW (ref 12.0–15.0)
MCH: 29.3 pg (ref 26.0–34.0)
MCHC: 33 g/dL (ref 30.0–36.0)
MCV: 88.8 fL (ref 80.0–100.0)
Platelets: 265 10*3/uL (ref 150–400)
RBC: 3.58 MIL/uL — ABNORMAL LOW (ref 3.87–5.11)
RDW: 13 % (ref 11.5–15.5)
WBC: 6.1 10*3/uL (ref 4.0–10.5)
nRBC: 0 % (ref 0.0–0.2)

## 2022-03-21 LAB — BASIC METABOLIC PANEL
Anion gap: 8 (ref 5–15)
BUN: 10 mg/dL (ref 8–23)
CO2: 26 mmol/L (ref 22–32)
Calcium: 9.2 mg/dL (ref 8.9–10.3)
Chloride: 97 mmol/L — ABNORMAL LOW (ref 98–111)
Creatinine, Ser: 0.61 mg/dL (ref 0.44–1.00)
GFR, Estimated: >60 mL/min (ref >60)
Glucose, Bld: 148 mg/dL — ABNORMAL HIGH (ref 70–99)
Potassium: 4.2 mmol/L (ref 3.5–5.1)
Sodium: 131 mmol/L — ABNORMAL LOW (ref 135–145)

## 2022-03-21 LAB — TROPONIN I (HIGH SENSITIVITY): Troponin I (High Sensitivity): 4 ng/L (ref <18)

## 2022-03-21 LAB — TSH: TSH: 0.81 u[IU]/mL (ref 0.350–4.500)

## 2022-03-21 MED ORDER — SODIUM CHLORIDE 0.9 % IV BOLUS: 500.0000 mL | Freq: Once | INTRAVENOUS | Status: DC

## 2022-03-21 NOTE — ED Notes (Signed)
First nurse note- pt brought in via ems from home with weakness and abnormal labs.  Pt was seen in er yesterday.  Bp 169/77, p 70, oxygen sat 97% on 2 liters per ems.  Hx copd  pt in recliner

## 2022-03-21 NOTE — ED Provider Notes (Signed)
[facility] Provider Note    Event Date/Time   First MD Initiated Contact with Patient 03/21/22 2019     (approximate)   History   Weakness   HPI  Brentney Goldbach is a 66 y.o. female  who presents to the emergency department today at the advice of her primary care provider because of apparent abnormal blood work drawn today at PCP.  She says that she saw her PCP today and had lab work drawn. She states she was called by her PCP and told that she needed to go to the emergency department for a blood transfusion.  Unfortunately the patient is unaware what her hemoglobin level was on the blood work checked today by PCP.  Patient states that she has been feeling weak.  This has been getting slowly worse since a recent hospitalization for GI bleed and anemia.  She says that she has been having a hard time being able to take care of herself.  She says that she has also had intermittent episodes of shortness of breath.  Has history of COPD and is on oxygen.  Shortness of breath was worse today.  She did not try using her inhalers. Additionally patient had checked in to the emergency department yesterday, had blood work drawn and left prior to evaluation.   Physical Exam   Triage Vital Signs: ED Triage Vitals  Enc Vitals Group     BP 03/21/22 1938 (!) 160/68     Pulse Rate 03/21/22 1938 74     Resp 03/21/22 1938 20     Temp 03/21/22 1938 98.2 F (36.8 C)     Temp Source 03/21/22 1938 Oral     SpO2 03/21/22 1938 97 %     Weight 03/21/22 1940 127 lb 13.9 oz (58 kg)     Height 03/21/22 1940 '5\' 4"'$  (1.626 m)     Head Circumference --      Peak Flow --      Pain Score 03/21/22 1940 0     Pain Loc --      Pain Edu? --      Excl. in [place]? --     Most recent vital signs: Vitals:   03/21/22 1938  BP: (!) 160/68  Pulse: 74  Resp: 20  Temp: 98.2 F (36.8 C)  SpO2: 97%   General: Awake, alert, oriented. CV:  Good peripheral perfusion. Regular rate and  rhythm. Resp:  Normal effort. Lungs clear Abd:  Diffusely minimally tender to palpation.   ED Results / Procedures / Treatments   Labs (all labs ordered are listed, but only abnormal results are displayed) Labs Reviewed  BASIC METABOLIC PANEL - Abnormal; Notable for the following components:      Result Value   Sodium 131 (*)    Chloride 97 (*)    Glucose, Bld 148 (*)    All other components within normal limits  CBC - Abnormal; Notable for the following components:   RBC 3.58 (*)    Hemoglobin 10.5 (*)    HCT 31.8 (*)    All other components within normal limits  TROPONIN I (HIGH SENSITIVITY)     EKG  I, Nance Pear, attending physician, personally viewed and interpreted this EKG  EKG Time: 1948 Rate: 74 Rhythm: normal sinus rhythm Axis: normal Intervals: qtc 437 QRS: narrow ST changes: no st elevation Impression: normal ekg  RADIOLOGY None   PROCEDURES:  Critical Care performed: No  Procedures   MEDICATIONS ORDERED  IN ED: Medications - No data to display   IMPRESSION / MDM / [place] / ED COURSE  I reviewed the triage vital signs and the nursing notes.                              Differential diagnosis includes, but is not limited to, infection, anemia, electrolyte abnormality.   Patient's presentation is most consistent with acute presentation with potential threat to life or bodily function.  Patient presents to the emergency department today because she was told by her PCP she needed a blood transfusion.  Patient states that this was based off of blood work drawn at PCPs office earlier today.  Blood work here in the emergency department does show anemia with hemoglobin of 10.5.  Hemoglobin that was drawn yesterday when she came to the emergency department was essentially the same at 10.6. Patient without any hypotension or tachycardia.  Unfortunately I could not find the blood work done at PCPs office today.  I did discuss with the  patient that certainly with a hemoglobin of 10.5 and the fact that this has improved since she was discharged from the hospital that she does not require blood transfusion at this time.  I do wonder if blood work checked at primary care's doctor's office was perhaps erroneous since they requested patient come to the emergency department for a blood transfusion however again unfortunately I cannot find the blood work results from PCPs office and patient is unsure what blood level they found in the office today.  Additionally the fact that her hemoglobin has been improving since time of discharge and she feels her symptoms have been worsening I have low suspicion that the anemia is what is causing her symptoms.  I did discuss this with the patient.  I do have concerns that there is some other etiology of the patient's weakness.  I do have concerns for possible pneumonia given that she complains of shortness of breath that has been intermittently worse.  Additionally would be concern for possible urinary tract infection.  Furthermore I would consider possible thyroid disorder or vitamin deficiency.  Regardless I did discuss with the patient that we would be working up other potential causes of her weakness.  Shortly after I left the patient's room I was told by nursing staff that she wanted to leave.  When I returned to the room she did state that she wanted to leave.  It is slightly unclear to me why she did not want [place] to work-up her weakness however I did again offer to and explained to her my concerns for possible infection.  She however was insistent that she would like to be discharged from the hospital.  I did encourage patient to follow-up with primary care who can perform further testing as an outpatient.  FINAL CLINICAL IMPRESSION(S) / ED DIAGNOSES   Final diagnoses:  Weakness    Note:  This document was prepared using Dragon voice recognition software and may include unintentional dictation  errors.    Nance Pear, MD 03/21/22 2056

## 2022-03-21 NOTE — ED Triage Notes (Signed)
Pt brought in via ems from home.  Pt came yesterday and left without seeing md.  Pt states she called her doctor today and was told to come back to be eval for low hgb.  Pt has weakness.  Iv in place  pt alert  pt on 2 liters oxygen.  Pt in recliner

## 2022-03-21 NOTE — Discharge Instructions (Addendum)
As we discussed we were unable to work up your weakness because you chose to leave. One of our concerns was for infection, potentially pneumonia since you have been having episodes of worsening shortness of breath, or potentially urinary tract infection. Left untreated infections can get worse and cause sepsis, organ dysfunction and death. Your blood work did show dehydration, however you left prior to receiving IV fluids. Please follow up with your primary care provider who can order further testing, or please do not hesitate to return to the emergency department if you decide you would like further work up.

## 2022-03-21 NOTE — ED Provider Triage Note (Cosign Needed)
Emergency Medicine Provider Triage Evaluation Note  Sylvia Richardson , a 66 y.o. female  was evaluated in triage.  Pt complains of weakness, concerns of low hemoglobin, some chest pain.  Review of Systems  Positive: See above Negative:   Physical Exam  There were no vitals taken for this visit. Gen:   Awake, no distress   Resp:  Normal effort  MSK:   Moves extremities without difficulty  Other:    Medical Decision Making  Medically screening exam initiated at 7:37 PM.  Appropriate orders placed.  Haydee Jabbour was informed that the remainder of the evaluation will be completed by another provider, this initial triage assessment does not replace that evaluation, and the importance of remaining in the ED until their evaluation is complete.     Versie Starks, PA-C 03/21/22 1938

## 2022-04-03 ENCOUNTER — Ambulatory Visit: Payer: Medicaid Other | Admitting: Urology

## 2022-04-03 NOTE — Progress Notes (Incomplete)
04/03/22 9:41 AM   Sylvia Richardson 22-Feb-1956 [id number]  Referring provider:  Barbaraann Boys, MD [place],   [id number] No chief complaint on file.     HPI: Sylvia Richardson is a 66 y.o.female with a personal history of acute urinary retention, OAB / urge incontinence, right kidney stoe, and chronic constipation who presents today for a 1 month follow-up for UA, PVR, and reassessment of symptoms.   She had  foley placed in ED on 08/23/2021 for presumed retention . CT scan during this visit visualized  signs of renal cortical scarring. Nephrolithiasis on the RIGHT with a 4 mm calculus. Renal cysts are present bilaterally. Urinary bladder is moderately distended extending into the low abdomen/upper pelvis. She had foley removed on 08/24/2021.   She was given a trial of Gemtesa on 10/24/2021  PMH: Past Medical History:  Diagnosis Date   Anxiety    Bipolar disorder ([place])    Cancer ([place])    skin   Carotid artery disease ([place])    Chronic cystitis    COPD (chronic obstructive pulmonary disease) (HCC)    Depression    Dysphagia    Epigastric pain    GERD (gastroesophageal reflux disease)    H/O degenerative disc disease    Headache    Hypercholesteremia    Hypertension    Severe tobacco use disorder    Social phobia    Stroke [facility])     Surgical History: Past Surgical History:  Procedure Laterality Date   BREAST BIOPSY Left    core bx- neg   CESAREAN SECTION     CHOLECYSTECTOMY     COLONOSCOPY WITH PROPOFOL N/A 03/05/2022   Procedure: COLONOSCOPY WITH PROPOFOL;  Surgeon: Lesly Rubenstein, MD;  Location: [facility] ENDOSCOPY;  Service: Endoscopy;  Laterality: N/A;   COLONOSCOPY WITH PROPOFOL N/A 03/12/2022   Procedure: COLONOSCOPY WITH PROPOFOL;  Surgeon: Lucilla Lame, MD;  Location: [facility] ENDOSCOPY;  Service: Endoscopy;  Laterality: N/A;   ESOPHAGOGASTRODUODENOSCOPY     ESOPHAGOGASTRODUODENOSCOPY (EGD) WITH PROPOFOL N/A 03/05/2022    Procedure: ESOPHAGOGASTRODUODENOSCOPY (EGD) WITH PROPOFOL;  Surgeon: Lesly Rubenstein, MD;  Location: [facility] ENDOSCOPY;  Service: Endoscopy;  Laterality: N/A;   HEMORRHOIDECTOMY WITH HEMORRHOID BANDING     history colonic polyps     Laparoscopic repair paraesophageal hiatal hernia     Nissen Fundoplication     TONSILLECTOMY     TUBAL LIGATION      Home Medications:  Allergies as of 04/03/2022       Reactions   Bactrim [sulfamethoxazole-trimethoprim] Diarrhea   Ciprofloxacin Anaphylaxis   Clindamycin/lincomycin Nausea And Vomiting   Zithromax [azithromycin]    Abdomen pain and acid reflux    Choline Fenofibrate    Other reaction(s): Other (See Comments) Other Reaction: LEFT SIDE TONGUE SWELLING Other reaction(s): Other (See Comments) LEFT SIDE TONGUE SWELLING Other Reaction: LEFT SIDE TONGUE SWELLING   Doxycycline Nausea Only   Fish Oil Nausea Only   Levaquin [levofloxacin] Rash   Niacin Diarrhea   Nifedipine Diarrhea   Rosuvastatin    Other reaction(s): Headache        Medication List        Accurate as of April 03, 2022  9:41 AM. If you have any questions, ask your nurse or doctor.          aspirin EC 81 MG tablet Take 81 mg by mouth daily.   atenolol 100 MG tablet Commonly known as: TENORMIN Take 100 mg by mouth daily.  clonazePAM 0.5 MG tablet Commonly known as: KLONOPIN Take 0.5 mg by mouth 4 (four) times daily as needed for anxiety.   Daliresp 500 MCG Tabs tablet Generic drug: roflumilast TAKE 1 TABLET BY MOUTH EVERY DAY(PLEASE KEEP APPT. IN [place])   DULoxetine 60 MG capsule Commonly known as: CYMBALTA Take 120 mg by mouth daily.   ferrous sulfate 325 (65 FE) MG tablet Take 1 tablet (325 mg total) by mouth daily with breakfast.   hydrochlorothiazide 25 MG tablet Commonly known as: HYDRODIURIL Take 25 mg by mouth daily.   ipratropium-albuterol 0.5-2.5 (3) MG/3ML Soln Commonly known as: DUONEB Take 3 mLs by nebulization 4 (four) times daily  as needed.   metoCLOPramide 10 MG tablet Commonly known as: REGLAN Take 10 mg by mouth in the morning, at noon, in the evening, and at bedtime.   montelukast 10 MG tablet Commonly known as: SINGULAIR Take 10 mg by mouth at bedtime.   omeprazole 40 MG capsule Commonly known as: PRILOSEC Take 40 mg by mouth every evening.   predniSONE 10 MG tablet Commonly known as: DELTASONE Take 10 mg by mouth daily.   Proventil HFA 108 (90 Base) MCG/ACT inhaler Generic drug: albuterol INHALE 2 PUFFS INTO THE LUNGS EVERY 4 HOURS AS NEEDED FOR WHEEZING OR SHORTNESS OF BREATH   traZODone 100 MG tablet Commonly known as: DESYREL Take 100-200 mg by mouth at bedtime.        Allergies:  Allergies  Allergen Reactions   Bactrim [Sulfamethoxazole-Trimethoprim] Diarrhea   Ciprofloxacin Anaphylaxis   Clindamycin/Lincomycin Nausea And Vomiting   Zithromax [Azithromycin]     Abdomen pain and acid reflux    Choline Fenofibrate     Other reaction(s): Other (See Comments) Other Reaction: LEFT SIDE TONGUE SWELLING Other reaction(s): Other (See Comments) LEFT SIDE TONGUE SWELLING Other Reaction: LEFT SIDE TONGUE SWELLING   Doxycycline Nausea Only   Fish Oil Nausea Only   Levaquin [Levofloxacin] Rash   Niacin Diarrhea   Nifedipine Diarrhea   Rosuvastatin     Other reaction(s): Headache    Family History: Family History  Problem Relation Age of Onset   Prostate cancer Father    Stroke Maternal Grandfather    Stroke Paternal Grandfather    Breast cancer Neg Hx     Social History:  reports that she has been smoking cigarettes. She has a 204.00 pack-year smoking history. She has never used smokeless tobacco. She reports that she does not drink alcohol and does not use drugs.   Physical Exam: There were no vitals taken for this visit.  Constitutional:  Alert and oriented, No acute distress. HEENT: [place] AT, moist mucus membranes.  Trachea midline, no masses. Cardiovascular: No clubbing,  cyanosis, or edema. Respiratory: Normal respiratory effort, no increased work of breathing. Skin: No rashes, bruises or suspicious lesions. Neurologic: Grossly intact, no focal deficits, moving all 4 extremities. Psychiatric: Normal mood and affect.  Laboratory Data:  Lab Results  Component Value Date   CREATININE 0.61 03/21/2022   Lab Results  Component Value Date   HGBA1C 5.6 03/19/2012    Urinalysis   Pertinent Imaging:    Assessment & Plan:     No follow-ups on file.  I,Kailey Littlejohn,acting as a Education administrator for Hollice Espy, MD.,have documented all relevant documentation on the behalf of Hollice Espy, MD,as directed by  Hollice Espy, MD while in the presence of Hollice Espy, [place] [address] [place],  [id number] [phone]

## 2022-04-24 ENCOUNTER — Ambulatory Visit: Payer: Medicaid Other | Admitting: Urology

## 2022-04-26 ENCOUNTER — Ambulatory Visit (INDEPENDENT_AMBULATORY_CARE_PROVIDER_SITE_OTHER): Payer: Medicaid Other | Admitting: Vascular Surgery

## 2022-04-26 ENCOUNTER — Encounter (INDEPENDENT_AMBULATORY_CARE_PROVIDER_SITE_OTHER): Payer: Medicaid Other

## 2022-05-28 ENCOUNTER — Ambulatory Visit: Payer: Medicaid Other | Admitting: Urology

## 2022-05-29 ENCOUNTER — Ambulatory Visit (INDEPENDENT_AMBULATORY_CARE_PROVIDER_SITE_OTHER): Payer: Medicaid Other | Admitting: Nurse Practitioner

## 2022-05-29 ENCOUNTER — Encounter (INDEPENDENT_AMBULATORY_CARE_PROVIDER_SITE_OTHER): Payer: Medicaid Other

## 2022-07-15 ENCOUNTER — Encounter (INDEPENDENT_AMBULATORY_CARE_PROVIDER_SITE_OTHER): Payer: Self-pay

## 2022-07-23 ENCOUNTER — Encounter (INDEPENDENT_AMBULATORY_CARE_PROVIDER_SITE_OTHER): Payer: Medicaid Other

## 2022-07-23 ENCOUNTER — Ambulatory Visit (INDEPENDENT_AMBULATORY_CARE_PROVIDER_SITE_OTHER): Payer: Medicaid Other | Admitting: Nurse Practitioner

## 2022-07-25 ENCOUNTER — Other Ambulatory Visit: Payer: Self-pay | Admitting: Otolaryngology

## 2022-07-25 DIAGNOSIS — E041 Nontoxic single thyroid nodule: Secondary | ICD-10-CM

## 2022-07-26 ENCOUNTER — Ambulatory Visit (INDEPENDENT_AMBULATORY_CARE_PROVIDER_SITE_OTHER): Payer: Medicare HMO | Admitting: Nurse Practitioner

## 2022-07-26 ENCOUNTER — Encounter (INDEPENDENT_AMBULATORY_CARE_PROVIDER_SITE_OTHER): Payer: Medicaid Other

## 2022-07-29 ENCOUNTER — Emergency Department: Payer: Medicare HMO

## 2022-07-29 ENCOUNTER — Encounter: Payer: Self-pay | Admitting: Emergency Medicine

## 2022-07-29 ENCOUNTER — Other Ambulatory Visit: Payer: Self-pay

## 2022-07-29 ENCOUNTER — Emergency Department
Admission: EM | Admit: 2022-07-29 | Discharge: 2022-07-29 | Disposition: A | Discharge status: Home / Self Care | Payer: Medicare HMO | Attending: Emergency Medicine | Admitting: Emergency Medicine

## 2022-07-29 DIAGNOSIS — I700 Atherosclerosis of aorta: Secondary | ICD-10-CM | POA: Diagnosis not present

## 2022-07-29 DIAGNOSIS — E871 Hypo-osmolality and hyponatremia: Secondary | ICD-10-CM | POA: Insufficient documentation

## 2022-07-29 DIAGNOSIS — N200 Calculus of kidney: Secondary | ICD-10-CM | POA: Diagnosis not present

## 2022-07-29 DIAGNOSIS — I10 Essential (primary) hypertension: Secondary | ICD-10-CM | POA: Diagnosis not present

## 2022-07-29 DIAGNOSIS — J441 Chronic obstructive pulmonary disease with (acute) exacerbation: Secondary | ICD-10-CM | POA: Diagnosis not present

## 2022-07-29 DIAGNOSIS — Z1152 Encounter for screening for COVID-19: Secondary | ICD-10-CM | POA: Diagnosis not present

## 2022-07-29 DIAGNOSIS — R197 Diarrhea, unspecified: Secondary | ICD-10-CM | POA: Diagnosis not present

## 2022-07-29 DIAGNOSIS — R0602 Shortness of breath: Secondary | ICD-10-CM | POA: Diagnosis present

## 2022-07-29 LAB — COMPREHENSIVE METABOLIC PANEL
ALT: 26 U/L (ref 0–44)
AST: 19 U/L (ref 15–41)
Albumin: 3.7 g/dL (ref 3.5–5.0)
Alkaline Phosphatase: 60 U/L (ref 38–126)
Anion gap: 7 (ref 5–15)
BUN: 12 mg/dL (ref 8–23)
CO2: 29 mmol/L (ref 22–32)
Calcium: 9.8 mg/dL (ref 8.9–10.3)
Chloride: 94 mmol/L — ABNORMAL LOW (ref 98–111)
Creatinine, Ser: 0.54 mg/dL (ref 0.44–1.00)
GFR, Estimated: >60 mL/min (ref >60)
Glucose, Bld: 95 mg/dL (ref 70–99)
Potassium: 4.1 mmol/L (ref 3.5–5.1)
Sodium: 130 mmol/L — ABNORMAL LOW (ref 135–145)
Total Bilirubin: 0.5 mg/dL (ref 0.3–1.2)
Total Protein: 6.9 g/dL (ref 6.5–8.1)

## 2022-07-29 LAB — CBC WITH DIFFERENTIAL/PLATELET
Abs Immature Granulocytes: 0.03 10*3/uL (ref 0.00–0.07)
Basophils Absolute: 0 10*3/uL (ref 0.0–0.1)
Basophils Relative: 0 %
Eosinophils Absolute: 0.2 10*3/uL (ref 0.0–0.5)
Eosinophils Relative: 3 %
HCT: 44.4 % (ref 36.0–46.0)
Hemoglobin: 14.9 g/dL (ref 12.0–15.0)
Immature Granulocytes: 0 %
Lymphocytes Relative: 27 %
Lymphs Abs: 2.2 10*3/uL (ref 0.7–4.0)
MCH: 27.9 pg (ref 26.0–34.0)
MCHC: 33.6 g/dL (ref 30.0–36.0)
MCV: 83.1 fL (ref 80.0–100.0)
Monocytes Absolute: 0.8 10*3/uL (ref 0.1–1.0)
Monocytes Relative: 9 %
Neutro Abs: 4.9 10*3/uL (ref 1.7–7.7)
Neutrophils Relative %: 61 %
Platelets: 235 10*3/uL (ref 150–400)
RBC: 5.34 MIL/uL — ABNORMAL HIGH (ref 3.87–5.11)
RDW: 16.5 % — ABNORMAL HIGH (ref 11.5–15.5)
WBC: 8.2 10*3/uL (ref 4.0–10.5)
nRBC: 0 % (ref 0.0–0.2)

## 2022-07-29 LAB — URINALYSIS, ROUTINE W REFLEX MICROSCOPIC
Bilirubin Urine: NEGATIVE
Glucose, UA: NEGATIVE mg/dL
Ketones, ur: NEGATIVE mg/dL
Leukocytes,Ua: NEGATIVE
Nitrite: NEGATIVE
Protein, ur: NEGATIVE mg/dL
Specific Gravity, Urine: 1.008 (ref 1.005–1.030)
pH: 6 (ref 5.0–8.0)

## 2022-07-29 LAB — RESP PANEL BY RT-PCR (FLU A&B, COVID) ARPGX2
Influenza A by PCR: NEGATIVE
Influenza B by PCR: NEGATIVE
SARS Coronavirus 2 by RT PCR: NEGATIVE

## 2022-07-29 LAB — TROPONIN I (HIGH SENSITIVITY): Troponin I (High Sensitivity): 3 ng/L (ref <18)

## 2022-07-29 MED ORDER — METHYLPREDNISOLONE SODIUM SUCC 125 MG IJ SOLR
125.0000 mg | Freq: Once | INTRAMUSCULAR | Status: AC
  Administered 2022-07-29: 125 mg via INTRAVENOUS
  Filled 2022-07-29: qty 2

## 2022-07-29 MED ORDER — IPRATROPIUM-ALBUTEROL 0.5-2.5 (3) MG/3ML IN SOLN
3.0000 mL | Freq: Once | RESPIRATORY_TRACT | Status: AC
  Administered 2022-07-29: 3 mL via RESPIRATORY_TRACT
  Filled 2022-07-29: qty 3

## 2022-07-29 MED ORDER — PREDNISONE 20 MG PO TABS: 40.0000 mg | ORAL_TABLET | Freq: Every day | ORAL | 0 refills | Status: AC

## 2022-07-29 MED ORDER — IOHEXOL 350 MG/ML SOLN
100.0000 mL | Freq: Once | INTRAVENOUS | Status: AC | PRN
  Administered 2022-07-29: 100 mL via INTRAVENOUS

## 2022-07-29 MED ORDER — SODIUM CHLORIDE 0.9 % IV BOLUS
500.0000 mL | Freq: Once | INTRAVENOUS | Status: AC
  Administered 2022-07-29: 500 mL via INTRAVENOUS

## 2022-07-29 NOTE — ED Triage Notes (Signed)
First Nurse Note:  Arrives from home via [facility].  C?O flu like symptoms x 2 weeks, worsening symptoms.  Possible C-Diff exposure.  C/O diarrhea and nausea.  96% RA.  VS wnl.

## 2022-07-29 NOTE — ED Provider Triage Note (Signed)
Emergency Medicine Provider Triage Evaluation Note  Jenefer Woerner , a 66 y.o. female  was evaluated in triage.  Pt complains of 2.5 weeks of diarrhea. Cough and some mild SOB but HX of COPD. No changes from baseline. No reported fever/chills.  Review of Systems  Positive: Diarrhea, sob and cough Negative: Fever. Chills, CP, abd pain  Physical Exam  BP (!) 148/71 (BP Location: Left Arm)   Pulse 74   Temp 98.5 F (36.9 C) (Oral)   Resp 16   Ht '5\' 4"'$  (1.626 m)   Wt 56.7 kg   SpO2 95%   BMI 21.46 kg/m  Gen:   Awake, no distress   Resp:  Normal effort  MSK:   Moves extremities without difficulty  Other:    Medical Decision Making  Medically screening exam initiated at 6:53 PM.  Appropriate orders placed.  Tniyah Nakagawa was informed that the remainder of the evaluation will be completed by another provider, this initial triage assessment does not replace that evaluation, and the importance of remaining in the ED until their evaluation is complete.  Labs, stool samples, chest xray, urinalysis, covid/flu   Darletta Moll, PA-C 07/29/22 1853

## 2022-07-29 NOTE — Discharge Instructions (Signed)
You can take Tylenol 1 g every 8 hours.  Use the steroids to try to help with the inflammation of your lungs.  I Think this is all from your COPD so continue to use your inhalers.  As for your diarrhea I do not see any acute cause of it.  If you continue have diarrhea you take a stool sample to your primary care doctor and discuss with them but we are unable to test today given you do not give a stool in over 5 hours.  Your CT scans however were very reassuring and he return to the ER if develop worsening symptoms or any other concerns  IMPRESSION: 1. Negative for acute pulmonary embolus or aortic dissection. 2. Emphysema. No acute airspace disease. 3. No CT evidence for acute intra-abdominal or pelvic abnormality. 4. Nonobstructing right kidney stone. 5. Aortic atherosclerosis

## 2022-07-29 NOTE — ED Provider Notes (Signed)
[facility] Provider Note    Event Date/Time   First MD Initiated Contact with Patient 07/29/22 2004     (approximate)   History   Abdominal Pain and Diarrhea   HPI  Sylvia Richardson is a 66 y.o. female with COPD who comes in with concerns for abdominal pain and shortness of breath.  Patient reports having symptoms over the past 2 weeks.  She reports the number of diarrhea episodes will vary sometimes just being a few but has been going on on even with taking some Imodium.  Now she reports some significant lower abdominal pain as well as shortness of breath.  She reports that shortness of breath feels different than her normal COPD.  She reports not having it like this previously.  She reports that shortness of breath is worse when she exerts herself with trying to have the bowel movements but is even there at rest.  She denies any history of blood clots or swelling her legs.  Denies any falls or hitting her head.  Physical Exam   Triage Vital Signs: ED Triage Vitals  Enc Vitals Group     BP 07/29/22 1849 (!) 148/71     Pulse Rate 07/29/22 1849 74     Resp 07/29/22 1849 16     Temp 07/29/22 1849 98.5 F (36.9 C)     Temp Source 07/29/22 1849 Oral     SpO2 07/29/22 1849 95 %     Weight 07/29/22 1850 125 lb (56.7 kg)     Height 07/29/22 1850 '5\' 4"'$  (1.626 m)     Head Circumference --      Peak Flow --      Pain Score 07/29/22 1850 8     Pain Loc --      Pain Edu? --      Excl. in [place]? --     Most recent vital signs: Vitals:   07/29/22 1849  BP: (!) 148/71  Pulse: 74  Resp: 16  Temp: 98.5 F (36.9 C)  SpO2: 95%     General: Awake, no distress.  CV:  Good peripheral perfusion.  Resp:  Normal effort.  Wheezing noted Abd:  No distention.  Tender in the lower abdomen. Other:  No swelling in legs.  No calf tenderness   ED Results / Procedures / Treatments   Labs (all labs ordered are listed, but only abnormal results are  displayed) Labs Reviewed  COMPREHENSIVE METABOLIC PANEL - Abnormal; Notable for the following components:      Result Value   Sodium 130 (*)    Chloride 94 (*)    All other components within normal limits  CBC WITH DIFFERENTIAL/PLATELET - Abnormal; Notable for the following components:   RBC 5.34 (*)    RDW 16.5 (*)    All other components within normal limits  URINALYSIS, ROUTINE W REFLEX MICROSCOPIC - Abnormal; Notable for the following components:   Color, Urine YELLOW (*)    APPearance CLEAR (*)    Hgb urine dipstick MODERATE (*)    Bacteria, UA RARE (*)    All other components within normal limits  RESP PANEL BY RT-PCR (FLU A&B, COVID) ARPGX2  C DIFFICILE QUICK SCREEN W PCR REFLEX    GASTROINTESTINAL PANEL BY PCR, STOOL (REPLACES STOOL CULTURE)     EKG  My interpretation of EKG:  Normal sinus rate of 80 without any ST elevation except for a little in V2 but normal intervals.  She has had  some of the similar ST elevation in V2 previously  RADIOLOGY I have reviewed the xray personally and interpreted and no evidence of any pneumonia   PROCEDURES:  Critical Care performed: No  Procedures   MEDICATIONS ORDERED IN ED: Medications - No data to display   IMPRESSION / MDM / [place] / ED COURSE  I reviewed the triage vital signs and the nursing notes.   Patient's presentation is most consistent with acute presentation with potential threat to life or bodily function.  Patient comes in with abdominal pain.  CT scan ordered evaluate for perforation, obstruction, diverticulitis.  CT PE ordered given she reports significantly worsening shortness of breath more than her baseline COPD.  Denies any falls and hitting her head.  COVID test and flu test are negative.  His CMP shows slightly low sodium and chloride but around his baseline 4 months ago.  His CBC is reassuring.  Urine with some blood noted in it but creatinine was normal.  Patient given some fluids,  DuoNeb, steroid.  She reports her breathing is improved oxygen levels remain normal.  We discussed doing a course of steroids and her inhalers at home for COPD exacerbation.  Her CT scan was otherwise reassuring for acute pathology.  She has been unable to give a stool sample here for over 5 hours so unlikely to be C. difficile.  I discussed with patient reassuring images and she expressed understanding.  We will hold off on antibiotics as I suspect that this could cause worsening diarrhea and no signs for infection and worsening cough mucus production.  I considered admission but given patient is feeling better she can follow-up outpatient.  Her blood pressure was elevated she have this rechecked outpatient.  The patient is on the cardiac monitor to evaluate for evidence of arrhythmia and/or significant heart rate changes.      FINAL CLINICAL IMPRESSION(S) / ED DIAGNOSES   Final diagnoses:  Hyponatremia  Hypertension, unspecified type  COPD exacerbation (HCC)  Diarrhea, unspecified type     Rx / DC Orders   ED Discharge Orders          Ordered    predniSONE (DELTASONE) 20 MG tablet  Daily with breakfast        07/29/22 2253             Note:  This document was prepared using Dragon voice recognition software and may include unintentional dictation errors.   [name], MD 07/29/22 [phone]

## 2022-07-29 NOTE — ED Triage Notes (Signed)
Pt to ED via [facility] from home for diarrhea x 2 week. Pt states that she is also having abdominal cramps and nausea. Pt is currently in NAD.

## 2022-07-30 ENCOUNTER — Other Ambulatory Visit: Payer: Medicaid Other

## 2022-08-01 ENCOUNTER — Ambulatory Visit
Admission: RE | Admit: 2022-08-01 | Discharge: 2022-08-01 | Disposition: A | Discharge status: Home / Self Care | Payer: Medicare HMO | Source: Ambulatory Visit | Attending: Otolaryngology | Admitting: Otolaryngology

## 2022-08-01 DIAGNOSIS — E041 Nontoxic single thyroid nodule: Secondary | ICD-10-CM

## 2022-08-13 ENCOUNTER — Other Ambulatory Visit: Payer: Self-pay | Admitting: Otolaryngology

## 2022-08-13 DIAGNOSIS — R1310 Dysphagia, unspecified: Secondary | ICD-10-CM

## 2022-08-13 DIAGNOSIS — R634 Abnormal weight loss: Secondary | ICD-10-CM

## 2022-08-28 ENCOUNTER — Encounter (INDEPENDENT_AMBULATORY_CARE_PROVIDER_SITE_OTHER): Payer: Medicaid Other

## 2022-08-28 ENCOUNTER — Ambulatory Visit (INDEPENDENT_AMBULATORY_CARE_PROVIDER_SITE_OTHER): Payer: Medicare HMO | Admitting: Nurse Practitioner

## 2022-09-02 ENCOUNTER — Ambulatory Visit
Admission: RE | Admit: 2022-09-02 | Discharge: 2022-09-02 | Disposition: A | Discharge status: Home / Self Care | Payer: Medicare HMO | Source: Ambulatory Visit | Attending: Otolaryngology | Admitting: Otolaryngology

## 2022-09-02 DIAGNOSIS — R1310 Dysphagia, unspecified: Secondary | ICD-10-CM | POA: Diagnosis present

## 2022-09-02 DIAGNOSIS — R634 Abnormal weight loss: Secondary | ICD-10-CM | POA: Insufficient documentation

## 2022-09-02 NOTE — Progress Notes (Addendum)
Modified Barium Swallow Progress Note  Patient Details  Name: Sylvia Richardson MRN: [id number] Date of Birth: 1956/01/31  Today's Date: 09/02/2022  Modified Barium Swallow completed.  Full report located under Chart Review in the Imaging Section.  Brief recommendations include the following:  HPI: Patient is a 66 y.o. female with past medical history including severe reflux, s/p EGD, [place], laparoscopic repair of paraesophageal hernia (2018), COPD, anxiety, bipolar disorder, and HTN. Most recent EGD 03/05/2022: "no endoscopic esophageal abnormality to explain patient's dysphagia." GE junction was dilated to 65m. She reports weight loss (10-15 lbs over the past year), avoiding meats, breads, chips, and is only able to eat solid foods. She reports pills sticking on occasion. At times she has to "hock" food particles back up after she swallows. Laryngoscopy on 08/22/22 with findings of erythema, cobblestoning, and edema, laryngeal erythema and interarytnoid pachydermia. Pt reports high level of caffeine consumption: 1/2 pot of coffee, as well as sodas each day.   Clinical Impression  Patient presents with functional oropharyngeal swallow and mild pharyngoesophageal dysphagia. Oral stage is characterized by adequate bolus control, lingual transport and bolus clearance. Swallow initiation occurs at the level of the pyriform sinuses. Pharyngeal phase is noted for adequate hyolaryngeal elevation and excursion, complete epiglottic deflection and vestibular closure, and adequate tongue base retraction. Appearance of small outpouching along posterior pharyngeal wall at level of C5 just superior to pharyngoesophageal segment, which has appearance of fingerlike protrusion. It did not appear to significantly impact bolus flow, however there was intermittent retention around C5 region of pharynx (see still images 12 and 13) with both solids and liquids. Given pt report of difficulty with solids, pills  sticking, and occasionally coughing up solids, this region could be contributing cause of symptoms, however reflux findings per ENT likely a factor. Noted one instance of mild retrograde flow from the cervical esophagus into the pharynx with nectar liquid. Pt reports she feels at times she needs to exert extra force to swallow. Encourage ongoing f/u with GI for management of reflux/ esophageal dysphagia. Recommend she continue soft diet with thin liquids, avoid troublesome foods and reflux triggers. Encouraged pt to check with pharmacy re: crushing meds for easier swallowing. Pt was educated using video regarding evaluation findings.   Swallow Evaluation Recommendations   Recommended Consults: Other (Comment);Consider esophageal assessment (returns to ENT in January)   SLP Diet Recommendations: Dysphagia 3 (Mech soft) solids;Thin liquid   Liquid Administration via: Straw;Cup   Medication Administration: Crushed with puree (check with MD/pharmacy first)   Supervision: Patient able to self feed   Compensations: Slow rate;Small sips/bites;Follow solids with liquid   Postural Changes: Remain semi-upright after after feeds/meals (Comment);Seated upright at 90 degrees   Oral Care Recommendations: Oral care BID       MDeneise Lever [place] CCC-SLP Speech-Language Pathologist ([phone] MAliene Altes12/18/2023,6:31 PM

## 2022-09-13 ENCOUNTER — Other Ambulatory Visit: Payer: Self-pay | Admitting: Family Medicine

## 2022-09-13 DIAGNOSIS — R300 Dysuria: Secondary | ICD-10-CM

## 2022-09-13 DIAGNOSIS — F17210 Nicotine dependence, cigarettes, uncomplicated: Secondary | ICD-10-CM

## 2022-09-23 ENCOUNTER — Ambulatory Visit
Admission: RE | Admit: 2022-09-23 | Discharge: 2022-09-23 | Disposition: A | Discharge status: Home / Self Care | Payer: Medicare HMO | Source: Ambulatory Visit | Attending: Family Medicine | Admitting: Family Medicine

## 2022-09-23 DIAGNOSIS — F17210 Nicotine dependence, cigarettes, uncomplicated: Secondary | ICD-10-CM | POA: Diagnosis not present

## 2022-09-23 DIAGNOSIS — R300 Dysuria: Secondary | ICD-10-CM | POA: Insufficient documentation

## 2022-09-24 ENCOUNTER — Other Ambulatory Visit (INDEPENDENT_AMBULATORY_CARE_PROVIDER_SITE_OTHER): Payer: Self-pay | Admitting: Vascular Surgery

## 2022-09-24 DIAGNOSIS — I6523 Occlusion and stenosis of bilateral carotid arteries: Secondary | ICD-10-CM

## 2022-09-25 ENCOUNTER — Encounter (INDEPENDENT_AMBULATORY_CARE_PROVIDER_SITE_OTHER): Payer: Medicaid Other

## 2022-09-25 ENCOUNTER — Ambulatory Visit (INDEPENDENT_AMBULATORY_CARE_PROVIDER_SITE_OTHER): Payer: Medicare HMO | Admitting: Nurse Practitioner

## 2022-10-15 ENCOUNTER — Ambulatory Visit: Payer: Medicare HMO | Admitting: Urology

## 2022-10-16 ENCOUNTER — Encounter (INDEPENDENT_AMBULATORY_CARE_PROVIDER_SITE_OTHER): Payer: Self-pay | Admitting: Nurse Practitioner

## 2022-10-16 ENCOUNTER — Ambulatory Visit (INDEPENDENT_AMBULATORY_CARE_PROVIDER_SITE_OTHER): Payer: Medicare HMO | Admitting: Nurse Practitioner

## 2022-10-16 ENCOUNTER — Ambulatory Visit (INDEPENDENT_AMBULATORY_CARE_PROVIDER_SITE_OTHER): Payer: Medicare HMO

## 2022-10-16 VITALS — BP 184/82 | HR 73 | Ht 64.0 in | Wt 125.0 lb

## 2022-10-16 DIAGNOSIS — J449 Chronic obstructive pulmonary disease, unspecified: Secondary | ICD-10-CM | POA: Diagnosis not present

## 2022-10-16 DIAGNOSIS — I6523 Occlusion and stenosis of bilateral carotid arteries: Secondary | ICD-10-CM

## 2022-10-16 DIAGNOSIS — I10 Essential (primary) hypertension: Secondary | ICD-10-CM

## 2022-10-28 ENCOUNTER — Encounter (INDEPENDENT_AMBULATORY_CARE_PROVIDER_SITE_OTHER): Payer: Self-pay | Admitting: Nurse Practitioner

## 2022-10-28 NOTE — Progress Notes (Signed)
Subjective:    Patient ID: Sylvia Richardson, female    DOB: [birth year], 67 y.o.   MRN: [id number] Chief Complaint  Patient presents with   Follow-up    1 year carotid    The patient is seen for follow up evaluation of carotid stenosis. The carotid stenosis followed by ultrasound.   The patient denies amaurosis fugax. There is no recent history of TIA symptoms or focal motor deficits. There is no prior documented CVA.  The patient is taking enteric-coated aspirin 81 mg daily.  There is no history of migraine headaches. There is no history of seizures.  No recent shortening of the patient's walking distance or new symptoms consistent with claudication.  No history of rest pain symptoms. No new ulcers or wounds of the lower extremities have occurred.  There is no history of DVT, PE or superficial thrombophlebitis. No recent episodes of angina or shortness of breath documented.   Carotid Duplex done today shows 40 to 59% stenosis of the right ICA with 1 to 39% stenosis of the left ICA.  No change compared to last study in 2022    Review of Systems  All other systems reviewed and are negative.      Objective:   Physical Exam Vitals reviewed.  HENT:     Head: Normocephalic.  Neck:     Vascular: No carotid bruit.  Cardiovascular:     Rate and Rhythm: Normal rate.     Pulses: Normal pulses.  Pulmonary:     Effort: Pulmonary effort is normal.  Skin:    General: Skin is warm and dry.  Neurological:     Mental Status: She is alert and oriented to person, place, and time.  Psychiatric:        Mood and Affect: Mood normal.        Behavior: Behavior normal.        Thought Content: Thought content normal.        Judgment: Judgment normal.     BP (!) 184/82   Pulse 73   Ht 5' 4"$  (1.626 m)   Wt 125 lb (56.7 kg)   BMI 21.46 kg/m   Past Medical History:  Diagnosis Date   Anxiety    Bipolar disorder ([place])    Cancer ([place])    skin   Carotid artery disease (H[facility])     Chronic cystitis    COPD (chronic obstructive pulmonary disease) (HCC)    Depression    Dysphagia    Epigastric pain    GERD (gastroesophageal reflux disease)    H/O degenerative disc disease    Headache    Hypercholesteremia    Hypertension    Severe tobacco use disorder    Social phobia    Stroke [facility])     Social History   Socioeconomic History   Marital status: Divorced    Spouse name: Not on file   Number of children: Not on file   Years of education: Not on file   Highest education level: Not on file  Occupational History   Not on file  Tobacco Use   Smoking status: Every Day    Packs/day: 2.00    Years: 102.00    Total pack years: 204.00    Types: Cigarettes   Smokeless tobacco: Never   Tobacco comments:    10 cigarettes a day   Vaping Use   Vaping Use: Never used  Substance and Sexual Activity   Alcohol use: No   Drug use:  No   Sexual activity: Not on file  Other Topics Concern   Not on file  Social History Narrative   Not on file   Social Determinants of Health   Financial Resource Strain: Not on file  Food Insecurity: Not on file  Transportation Needs: Not on file  Physical Activity: Not on file  Stress: Not on file  Social Connections: Not on file  Intimate Partner Violence: Not on file    Past Surgical History:  Procedure Laterality Date   BREAST BIOPSY Left    core bx- neg   CESAREAN SECTION     CHOLECYSTECTOMY     COLONOSCOPY WITH PROPOFOL N/A 03/05/2022   Procedure: COLONOSCOPY WITH PROPOFOL;  Surgeon: Lesly Rubenstein, MD;  Location: [facility] ENDOSCOPY;  Service: Endoscopy;  Laterality: N/A;   COLONOSCOPY WITH PROPOFOL N/A 03/12/2022   Procedure: COLONOSCOPY WITH PROPOFOL;  Surgeon: Lucilla Lame, MD;  Location: [facility] ENDOSCOPY;  Service: Endoscopy;  Laterality: N/A;   ESOPHAGOGASTRODUODENOSCOPY     ESOPHAGOGASTRODUODENOSCOPY (EGD) WITH PROPOFOL N/A 03/05/2022   Procedure: ESOPHAGOGASTRODUODENOSCOPY (EGD) WITH PROPOFOL;  Surgeon:  Lesly Rubenstein, MD;  Location: [facility] ENDOSCOPY;  Service: Endoscopy;  Laterality: N/A;   HEMORRHOIDECTOMY WITH HEMORRHOID BANDING     history colonic polyps     Laparoscopic repair paraesophageal hiatal hernia     Nissen Fundoplication     TONSILLECTOMY     TUBAL LIGATION      Family History  Problem Relation Age of Onset   Prostate cancer Father    Stroke Maternal Grandfather    Stroke Paternal Grandfather    Breast cancer Neg Hx     Allergies  Allergen Reactions   Bactrim [Sulfamethoxazole-Trimethoprim] Diarrhea   Ciprofloxacin Anaphylaxis   Clindamycin/Lincomycin Nausea And Vomiting   Zithromax [Azithromycin]     Abdomen pain and acid reflux    Choline Fenofibrate     Other reaction(s): Other (See Comments) Other Reaction: LEFT SIDE TONGUE SWELLING Other reaction(s): Other (See Comments) LEFT SIDE TONGUE SWELLING Other Reaction: LEFT SIDE TONGUE SWELLING   Doxycycline Nausea Only   Fish Oil Nausea Only   Levaquin [Levofloxacin] Rash   Niacin Diarrhea   Nifedipine Diarrhea   Rosuvastatin     Other reaction(s): Headache       Latest Ref Rng & Units 07/29/2022    6:52 PM 03/21/2022    7:41 PM 03/20/2022   11:33 AM  CBC  WBC 4.0 - 10.5 K/uL 8.2  6.1  5.4   Hemoglobin 12.0 - 15.0 g/dL 14.9  10.5  10.6   Hematocrit 36.0 - 46.0 % 44.4  31.8  32.0   Platelets 150 - 400 K/uL 235  265  326       CMP     Component Value Date/Time   NA 130 (L) 07/29/2022 1852   NA 136 06/11/2014 1221   K 4.1 07/29/2022 1852   K 4.3 06/11/2014 1221   CL 94 (L) 07/29/2022 1852   CL 102 06/11/2014 1221   CO2 29 07/29/2022 1852   CO2 29 06/11/2014 1221   GLUCOSE 95 07/29/2022 1852   GLUCOSE 118 (H) 06/11/2014 1221   BUN 12 07/29/2022 1852   BUN 9 06/11/2014 1221   CREATININE 0.54 07/29/2022 1852   CREATININE 0.80 06/11/2014 1221   CALCIUM 9.8 07/29/2022 1852   CALCIUM 9.4 06/11/2014 1221   PROT 6.9 07/29/2022 1852   PROT 7.1 06/11/2014 1221   ALBUMIN 3.7 07/29/2022 1852    ALBUMIN 3.2 (L) 06/11/2014 1221  AST 19 07/29/2022 1852   AST 27 06/11/2014 1221   ALT 26 07/29/2022 1852   ALT 46 06/11/2014 1221   ALKPHOS 60 07/29/2022 1852   ALKPHOS 66 06/11/2014 1221   BILITOT 0.5 07/29/2022 1852   BILITOT 0.3 06/11/2014 1221   GFRNONAA >60 07/29/2022 1852   GFRNONAA >60 06/11/2014 1221   GFRNONAA >60 09/07/2013 0450   GFRAA >60 10/20/2019 1111   GFRAA >60 06/11/2014 1221   GFRAA >60 09/07/2013 0450     No results found.     Assessment & Plan:   1. Bilateral carotid artery stenosis Recommend:  Given the patient's asymptomatic subcritical stenosis no further invasive testing or surgery at this time.  Duplex ultrasound shows 40 to 59% stenosis of the right ICA with 1 to 39% in the left ICA.  Continue antiplatelet therapy as prescribed Continue management of CAD, HTN and Hyperlipidemia Healthy heart diet,  encouraged exercise at least 4 times per week Follow up in 12 months with duplex ultrasound and physical exam   2. Essential hypertension Continue antihypertensive medications as already ordered, these medications have been reviewed and there are no changes at this time.  BP elevated, if this continues patient may need to reach out to primary care physician  3. Chronic obstructive pulmonary disease, unspecified COPD type ([place]) Continue pulmonary medications and aerosols as already ordered, these medications have been reviewed and there are no changes at this time.    Current Outpatient Medications on File Prior to Visit  Medication Sig Dispense Refill   aspirin EC 81 MG tablet Take 81 mg by mouth daily.      atenolol (TENORMIN) 100 MG tablet Take 100 mg by mouth daily.  0   clonazePAM (KLONOPIN) 0.5 MG tablet Take 0.5 mg by mouth 4 (four) times daily as needed for anxiety.     DALIRESP 500 MCG TABS tablet TAKE 1 TABLET BY MOUTH EVERY DAY(PLEASE KEEP APPT. IN JULY) 30 tablet 3   DULoxetine (CYMBALTA) 60 MG capsule Take 120 mg by mouth daily.   0    ferrous sulfate 325 (65 FE) MG tablet Take 1 tablet (325 mg total) by mouth daily with breakfast. 30 tablet 3   hydrochlorothiazide (HYDRODIURIL) 25 MG tablet Take 25 mg by mouth daily.     metoCLOPramide (REGLAN) 10 MG tablet Take 10 mg by mouth in the morning, at noon, in the evening, and at bedtime.     montelukast (SINGULAIR) 10 MG tablet Take 10 mg by mouth at bedtime.      omeprazole (PRILOSEC) 40 MG capsule Take 40 mg by mouth every evening.     PROVENTIL HFA 108 (90 Base) MCG/ACT inhaler INHALE 2 PUFFS INTO THE LUNGS EVERY 4 HOURS AS NEEDED FOR WHEEZING OR SHORTNESS OF BREATH 6.7 g 1   traZODone (DESYREL) 100 MG tablet Take 100-200 mg by mouth at bedtime.     No current facility-administered medications on file prior to visit.    There are no Patient Instructions on file for this visit. No follow-ups on file.   Kris Hartmann, NP

## 2022-11-13 ENCOUNTER — Ambulatory Visit: Payer: Medicare HMO | Admitting: Urology

## 2022-11-22 ENCOUNTER — Ambulatory Visit: Payer: Medicare HMO | Admitting: Anesthesiology

## 2022-11-22 ENCOUNTER — Ambulatory Visit
Admission: RE | Admit: 2022-11-22 | Discharge: 2022-11-22 | Disposition: A | Discharge status: Home / Self Care | Payer: Medicare HMO | Source: Ambulatory Visit | Attending: Gastroenterology | Admitting: Gastroenterology

## 2022-11-22 ENCOUNTER — Encounter
Admission: RE | Disposition: A | Discharge status: Home / Self Care | Payer: Self-pay | Source: Ambulatory Visit | Attending: Gastroenterology

## 2022-11-22 DIAGNOSIS — D120 Benign neoplasm of cecum: Secondary | ICD-10-CM | POA: Diagnosis not present

## 2022-11-22 DIAGNOSIS — K5730 Diverticulosis of large intestine without perforation or abscess without bleeding: Secondary | ICD-10-CM | POA: Diagnosis not present

## 2022-11-22 DIAGNOSIS — D125 Benign neoplasm of sigmoid colon: Secondary | ICD-10-CM | POA: Diagnosis not present

## 2022-11-22 DIAGNOSIS — I10 Essential (primary) hypertension: Secondary | ICD-10-CM | POA: Insufficient documentation

## 2022-11-22 DIAGNOSIS — Z1211 Encounter for screening for malignant neoplasm of colon: Secondary | ICD-10-CM | POA: Diagnosis present

## 2022-11-22 DIAGNOSIS — Z9889 Other specified postprocedural states: Secondary | ICD-10-CM | POA: Insufficient documentation

## 2022-11-22 DIAGNOSIS — J449 Chronic obstructive pulmonary disease, unspecified: Secondary | ICD-10-CM | POA: Diagnosis not present

## 2022-11-22 DIAGNOSIS — D123 Benign neoplasm of transverse colon: Secondary | ICD-10-CM | POA: Diagnosis not present

## 2022-11-22 DIAGNOSIS — Z7982 Long term (current) use of aspirin: Secondary | ICD-10-CM | POA: Diagnosis not present

## 2022-11-22 DIAGNOSIS — D124 Benign neoplasm of descending colon: Secondary | ICD-10-CM | POA: Insufficient documentation

## 2022-11-22 DIAGNOSIS — F319 Bipolar disorder, unspecified: Secondary | ICD-10-CM | POA: Diagnosis not present

## 2022-11-22 DIAGNOSIS — K648 Other hemorrhoids: Secondary | ICD-10-CM | POA: Diagnosis not present

## 2022-11-22 DIAGNOSIS — Z9049 Acquired absence of other specified parts of digestive tract: Secondary | ICD-10-CM | POA: Insufficient documentation

## 2022-11-22 DIAGNOSIS — K640 First degree hemorrhoids: Secondary | ICD-10-CM | POA: Insufficient documentation

## 2022-11-22 HISTORY — PX: COLONOSCOPY WITH PROPOFOL: SHX5780

## 2022-11-22 SURGERY — COLONOSCOPY WITH PROPOFOL
Anesthesia: General

## 2022-11-22 MED ORDER — PROPOFOL 10 MG/ML IV BOLUS
INTRAVENOUS | Status: DC | PRN
  Administered 2022-11-22: 50 mg via INTRAVENOUS

## 2022-11-22 MED ORDER — GLYCOPYRROLATE 0.2 MG/ML IJ SOLN
INTRAMUSCULAR | Status: AC
  Filled 2022-11-22: qty 1

## 2022-11-22 MED ORDER — DEXMEDETOMIDINE HCL IN NACL 80 MCG/20ML IV SOLN
INTRAVENOUS | Status: AC
  Filled 2022-11-22: qty 20

## 2022-11-22 MED ORDER — GLYCOPYRROLATE 0.2 MG/ML IJ SOLN
INTRAMUSCULAR | Status: DC | PRN
  Administered 2022-11-22: .2 mg via INTRAVENOUS

## 2022-11-22 MED ORDER — LIDOCAINE HCL (CARDIAC) PF 100 MG/5ML IV SOSY
PREFILLED_SYRINGE | INTRAVENOUS | Status: DC | PRN
  Administered 2022-11-22: 80 mg via INTRAVENOUS

## 2022-11-22 MED ORDER — PROPOFOL 1000 MG/100ML IV EMUL
INTRAVENOUS | Status: AC
  Filled 2022-11-22: qty 200

## 2022-11-22 MED ORDER — LIDOCAINE HCL (PF) 2 % IJ SOLN
INTRAMUSCULAR | Status: AC
  Filled 2022-11-22: qty 20

## 2022-11-22 MED ORDER — PHENYLEPHRINE 80 MCG/ML (10ML) SYRINGE FOR IV PUSH (FOR BLOOD PRESSURE SUPPORT)
PREFILLED_SYRINGE | INTRAVENOUS | Status: AC
  Filled 2022-11-22: qty 10

## 2022-11-22 MED ORDER — PROPOFOL 1000 MG/100ML IV EMUL
INTRAVENOUS | Status: AC
  Filled 2022-11-22: qty 100

## 2022-11-22 MED ORDER — SODIUM CHLORIDE 0.9 % IV SOLN
INTRAVENOUS | Status: DC
  Administered 2022-11-22: 20 mL/h via INTRAVENOUS

## 2022-11-22 MED ORDER — ONDANSETRON HCL 4 MG/2ML IJ SOLN
INTRAMUSCULAR | Status: AC
  Filled 2022-11-22: qty 2

## 2022-11-22 MED ORDER — PROPOFOL 500 MG/50ML IV EMUL
INTRAVENOUS | Status: DC | PRN
  Administered 2022-11-22: 209.252 ug/kg/min via INTRAVENOUS

## 2022-11-22 MED ORDER — PHENYLEPHRINE HCL (PRESSORS) 10 MG/ML IV SOLN
INTRAVENOUS | Status: DC | PRN
  Administered 2022-11-22: 80 ug via INTRAVENOUS

## 2022-11-22 MED ORDER — DEXMEDETOMIDINE HCL 200 MCG/2ML IV SOLN
INTRAVENOUS | Status: DC | PRN
  Administered 2022-11-22: 8 ug via INTRAVENOUS

## 2022-11-22 MED ORDER — STERILE WATER FOR IRRIGATION IR SOLN
Status: DC | PRN
  Administered 2022-11-22: 60 mL

## 2022-11-22 NOTE — H&P (Signed)
Outpatient short stay form Pre-procedure 11/22/2022  Lesly Rubenstein, MD  Primary Physician: Barbaraann Boys, MD  Reason for visit:  History of piecemeal resection  History of present illness:    67 y/o lady with history of COPD, hypertension, bipolar, and tobacco abuse here for colonoscopy for history of piecemeal resection of large cecal polyp and also removal of remaining polyps after colonoscopy 1-2 years ago with post-procedural bleeding. Only takes aspirin. History of cholecystectomy and paraesophageal hernia repair. No family history of GI malignancies.    Current Facility-Administered Medications:    0.9 %  sodium chloride infusion, , Intravenous, Continuous, Conni Knighton, Hilton Cork, MD, Last Rate: 20 mL/hr at 11/22/22 0651, 20 mL/hr at 11/22/22 0651  Medications Prior to Admission  Medication Sig Dispense Refill Last Dose   aspirin EC 81 MG tablet Take 81 mg by mouth daily.    11/21/2022   atenolol (TENORMIN) 100 MG tablet Take 100 mg by mouth daily.  0 11/22/2022 at 0400   clonazePAM (KLONOPIN) 0.5 MG tablet Take 0.5 mg by mouth 4 (four) times daily as needed for anxiety.   11/21/2022   DALIRESP 500 MCG TABS tablet TAKE 1 TABLET BY MOUTH EVERY DAY(PLEASE KEEP APPT. IN JULY) 30 tablet 3 11/22/2022 at 0400   DULoxetine (CYMBALTA) 60 MG capsule Take 120 mg by mouth daily.   0 11/21/2022   ferrous sulfate 325 (65 FE) MG tablet Take 1 tablet (325 mg total) by mouth daily with breakfast. 30 tablet 3 Past Week   hydrochlorothiazide (HYDRODIURIL) 25 MG tablet Take 25 mg by mouth daily.   Past Week   metoCLOPramide (REGLAN) 10 MG tablet Take 10 mg by mouth in the morning, at noon, in the evening, and at bedtime.   Past Week   omeprazole (PRILOSEC) 40 MG capsule Take 40 mg by mouth every evening.   11/21/2022   PROVENTIL HFA 108 (90 Base) MCG/ACT inhaler INHALE 2 PUFFS INTO THE LUNGS EVERY 4 HOURS AS NEEDED FOR WHEEZING OR SHORTNESS OF BREATH 6.7 g 1 11/21/2022   traZODone (DESYREL) 100 MG tablet Take  100-200 mg by mouth at bedtime.   11/21/2022   montelukast (SINGULAIR) 10 MG tablet Take 10 mg by mouth at bedtime.  (Patient not taking: Reported on 11/22/2022)   Not Taking     Allergies  Allergen Reactions   Bactrim [Sulfamethoxazole-Trimethoprim] Diarrhea   Ciprofloxacin Anaphylaxis   Clindamycin/Lincomycin Nausea And Vomiting   Zithromax [Azithromycin]     Abdomen pain and acid reflux    Choline Fenofibrate     Other reaction(s): Other (See Comments) Other Reaction: LEFT SIDE TONGUE SWELLING Other reaction(s): Other (See Comments) LEFT SIDE TONGUE SWELLING Other Reaction: LEFT SIDE TONGUE SWELLING   Doxycycline Nausea Only   Fish Oil Nausea Only   Levaquin [Levofloxacin] Rash   Niacin Diarrhea   Nifedipine Diarrhea   Rosuvastatin     Other reaction(s): Headache     Past Medical History:  Diagnosis Date   Anxiety    Bipolar disorder ([place])    Cancer ([place])    skin   Carotid artery disease (HCC)    Chronic cystitis    COPD (chronic obstructive pulmonary disease) (HCC)    Depression    Dysphagia    Epigastric pain    GERD (gastroesophageal reflux disease)    H/O degenerative disc disease    Headache    Hypercholesteremia    Hypertension    Severe tobacco use disorder    Social phobia    Stroke (  [place])     Review of systems:  Otherwise negative.    Physical Exam  Gen: Alert, oriented. Appears stated age.  HEENT: PERRLA. Lungs: No respiratory distress CV: RRR Abd: soft, benign, no masses Ext: No edema   Planned procedures: Proceed with colonoscopy. The patient understands the nature of the planned procedure, indications, risks, alternatives and potential complications including but not limited to bleeding, infection, perforation, damage to internal organs and possible oversedation/side effects from anesthesia. The patient agrees and gives consent to proceed.  Please refer to procedure notes for findings, recommendations and patient disposition/instructions.      Lesly Rubenstein, MD [facility] : [facility]

## 2022-11-22 NOTE — Anesthesia Postprocedure Evaluation (Signed)
Anesthesia Post Note  Patient: Sylvia Richardson  Procedure(s) Performed: COLONOSCOPY WITH PROPOFOL  Patient location during evaluation: Endoscopy Anesthesia Type: General Level of consciousness: awake and alert Pain management: pain level controlled Vital Signs Assessment: post-procedure vital signs reviewed and stable Respiratory status: spontaneous breathing, nonlabored ventilation, respiratory function stable and patient connected to nasal cannula oxygen Cardiovascular status: blood pressure returned to baseline and stable Postop Assessment: no apparent nausea or vomiting Anesthetic complications: no   No notable events documented.   Last Vitals:  Vitals:   11/22/22 0834 11/22/22 0844  BP: (!) 121/52 (!) 154/63  Pulse:    Resp:    Temp:    SpO2:      Last Pain:  Vitals:   11/22/22 0844  TempSrc:   PainSc: 0-No pain                 Arita Miss

## 2022-11-22 NOTE — Op Note (Signed)
[facility] Patient Name: Sylvia Richardson Procedure Date: 11/22/2022 7:04 AM MRN: [id number] Account #: [id number] Date of Birth: [birth year] Admit Type: Outpatient Age: 67 Room: [facility] ENDO ROOM 1 Gender: Female Note Status: Finalized Instrument Name: Jasper Riling [id number] Procedure:             Colonoscopy Indications:           Surveillance: Piecemeal removal of large sessile                         adenoma last colonoscopy (< 3 yrs) Providers:             Andrey Farmer MD, MD Referring MD:          Ane Payment, MD (Referring MD) Medicines:             Monitored Anesthesia Care Complications:         No immediate complications. Estimated blood loss:                         Minimal. Procedure:             Pre-Anesthesia Assessment:                        - Prior to the procedure, a History and Physical was                         performed, and patient medications and allergies were                         reviewed. The patient is competent. The risks and                         benefits of the procedure and the sedation options and                         risks were discussed with the patient. All questions                         were answered and informed consent was obtained.                         Patient identification and proposed procedure were                         verified by the physician, the nurse, the                         anesthesiologist, the anesthetist and the technician                         in the endoscopy suite. Mental Status Examination:                         alert and oriented. Airway Examination: normal                         oropharyngeal airway and neck mobility. Respiratory  Examination: clear to auscultation. CV Examination:                         normal. Prophylactic Antibiotics: The patient does not                         require prophylactic antibiotics. Prior                          Anticoagulants: The patient has taken no anticoagulant                         or antiplatelet agents. ASA Grade Assessment: III - A                         patient with severe systemic disease. After reviewing                         the risks and benefits, the patient was deemed in                         satisfactory condition to undergo the procedure. The                         anesthesia plan was to use monitored anesthesia care                         (MAC). Immediately prior to administration of                         medications, the patient was re-assessed for adequacy                         to receive sedatives. The heart rate, respiratory                         rate, oxygen saturations, blood pressure, adequacy of                         pulmonary ventilation, and response to care were                         monitored throughout the procedure. The physical                         status of the patient was re-assessed after the                         procedure.                        After obtaining informed consent, the colonoscope was                         passed under direct vision. Throughout the procedure,                         the patient's blood pressure, pulse, and oxygen  saturations were monitored continuously. The                         Colonoscope was introduced through the anus and                         advanced to the the cecum, identified by appendiceal                         orifice and ileocecal valve. The colonoscopy was                         performed without difficulty. The patient tolerated                         the procedure well. The quality of the bowel                         preparation was adequate to identify polyps. The                         ileocecal valve, appendiceal orifice, and rectum were                         photographed. Findings:      The perianal and digital rectal examinations were  normal.      A large post polypectomy scar was found in the cecum. There was residual       polyp tissue. The polyp was removed with a cold snare. Resection and       retrieval were complete. Estimated blood loss was minimal.      Two sessile polyps were found in the transverse colon. The polyps were 2       to 7 mm in size. These polyps were removed with a cold snare. Resection       and retrieval were complete. Estimated blood loss was minimal.      A 1 mm polyp was found in the descending colon. The polyp was sessile.       The polyp was removed with a jumbo cold forceps. Resection and retrieval       were complete. Estimated blood loss was minimal.      Two sessile polyps were found in the sigmoid colon. The polyps were 2 to       4 mm in size. These polyps were removed with a cold snare. Resection and       retrieval were complete. Estimated blood loss was minimal.      Multiple large-mouthed and small-mouthed diverticula were found in the       sigmoid colon.      A large post polypectomy scar was found in the recto-sigmoid colon.       There was residual polypoid tissue. The polyp was removed with a cold       snare. Resection and retrieval were complete. Estimated blood loss was       minimal.      A tattoo was seen in the recto-sigmoid colon.      Internal hemorrhoids were found during retroflexion. The hemorrhoids       were Grade I (internal hemorrhoids that do not prolapse).      The exam was otherwise without abnormality  on direct and retroflexion       views. Impression:            - Post-polypectomy scar in the cecum.                        - Two 2 to 7 mm polyps in the transverse colon,                         removed with a cold snare. Resected and retrieved.                        - One 1 mm polyp in the descending colon, removed with                         a jumbo cold forceps. Resected and retrieved.                        - Two 2 to 4 mm polyps in the sigmoid colon,  removed                         with a cold snare. Resected and retrieved.                        - Diverticulosis in the sigmoid colon.                        - Post-polypectomy scar in the recto-sigmoid colon.                        - A tattoo was seen in the recto-sigmoid colon.                        - Internal hemorrhoids.                        - The examination was otherwise normal on direct and                         retroflexion views. Recommendation:        - Discharge patient to home.                        - Resume previous diet.                        - Continue present medications.                        - Await pathology results.                        - Repeat colonoscopy in 1 year for surveillance.                        - Return to referring physician as previously                         scheduled. Procedure Code(s):     --- Professional ---  45385, Colonoscopy, flexible; with removal of                         tumor(s), polyp(s), or other lesion(s) by snare                         technique                        45380, 59, Colonoscopy, flexible; with biopsy, single                         or multiple Diagnosis Code(s):     --- Professional ---                        Z86.010, Personal history of colonic polyps                        Z98.890, Other specified postprocedural states                        D12.3, Benign neoplasm of transverse colon (hepatic                         flexure or splenic flexure)                        D12.5, Benign neoplasm of sigmoid colon                        D12.4, Benign neoplasm of descending colon                        K64.0, First degree hemorrhoids                        K57.30, Diverticulosis of large intestine without                         perforation or abscess without bleeding CPT copyright 2022 [employer]. All rights reserved. The codes documented in this report are preliminary and  upon coder review may  be revised to meet current compliance requirements. Andrey Farmer MD, MD 11/22/2022 8:35:18 AM Number of Addenda: 0 Note Initiated On: 11/22/2022 7:04 AM Scope Withdrawal Time: 0 hours 19 minutes 53 seconds  Total Procedure Duration: 0 hours 29 minutes 23 seconds  Estimated Blood Loss:  Estimated blood loss was minimal.      [facility]

## 2022-11-22 NOTE — Interval H&P Note (Signed)
History and Physical Interval Note:  11/22/2022 7:46 AM  Sylvia Richardson  has presented today for surgery, with the diagnosis of H/O Colonic Polyps.  The various methods of treatment have been discussed with the patient and family. After consideration of risks, benefits and other options for treatment, the patient has consented to  Procedure(s): COLONOSCOPY WITH PROPOFOL (N/A) as a surgical intervention.  The patient's history has been reviewed, patient examined, no change in status, stable for surgery.  I have reviewed the patient's chart and labs.  Questions were answered to the patient's satisfaction.     Lesly Rubenstein  Ok to proceed with colonoscopy

## 2022-11-22 NOTE — Transfer of Care (Signed)
Immediate Anesthesia Transfer of Care Note  Patient: Sylvia Richardson  Procedure(s) Performed: COLONOSCOPY WITH PROPOFOL  Patient Location: Endoscopy Unit  Anesthesia Type:General  Level of Consciousness: drowsy  Airway & Oxygen Therapy: Patient Spontanous Breathing  Post-op Assessment: Report given to RN and Post -op Vital signs reviewed and stable  Post vital signs: Reviewed and stable  Last Vitals:  Vitals Value Taken Time  BP 133/63 11/22/22 0824  Temp    Pulse 71 11/22/22 0823  Resp 16 11/22/22 0823  SpO2 100 % 11/22/22 0823  Vitals shown include unvalidated device data.  Last Pain:  Vitals:   11/22/22 [id number]  TempSrc: Temporal  PainSc: 0-No pain         Complications: No notable events documented.

## 2022-11-22 NOTE — Anesthesia Preprocedure Evaluation (Signed)
Anesthesia Evaluation  Patient identified by MRN, date of birth, ID band Patient awake    Reviewed: Allergy & Precautions, NPO status , Patient's Chart, lab work & pertinent test results  Airway Mallampati: II  TM Distance: >3 FB Neck ROM: Full    Dental  (+) Teeth Intact   Pulmonary shortness of breath, COPD,  COPD inhaler and oxygen dependent, Current SmokerPatient did not abstain from smoking.    + decreased breath sounds      Cardiovascular Exercise Tolerance: Poor hypertension, Pt. on medications + CAD and + Peripheral Vascular Disease   Rhythm:Regular Rate:Tachycardia     Neuro/Psych  Headaches PSYCHIATRIC DISORDERS Anxiety Depression Bipolar Disorder   TIA   GI/Hepatic Neg liver ROS,GERD  Medicated and Poorly Controlled,,rectal bleeding   Endo/Other  negative endocrine ROS    Renal/GU negative Renal ROS  negative genitourinary   Musculoskeletal  (+) Arthritis ,    Abdominal Normal abdominal exam  (+)   Peds negative pediatric ROS (+)  Hematology   Anesthesia Other Findings   Past Medical History: No date: Anxiety No date: Bipolar disorder ([place]) No date: Cancer ([place])     Comment:  skin No date: Carotid artery disease (H[facility]) No date: Chronic cystitis No date: COPD (chronic obstructive pulmonary disease) ([facility]) No date: Depression No date: Dysphagia No date: Epigastric pain No date: GERD (gastroesophageal reflux disease) No date: H/O degenerative disc disease No date: Headache No date: Hypercholesteremia No date: Hypertension No date: Severe tobacco use disorder No date: Social phobia No date: Stroke [facility])  Past Surgical History: No date: BREAST BIOPSY; Left     Comment:  core bx- neg No date: CESAREAN SECTION No date: CHOLECYSTECTOMY No date: ESOPHAGOGASTRODUODENOSCOPY No date: HEMORRHOIDECTOMY WITH HEMORRHOID BANDING No date: history colonic polyps No date: Laparoscopic repair paraesophageal  hiatal hernia No date: Nissen Fundoplication No date: TONSILLECTOMY No date: TUBAL LIGATION  BMI    Body Mass Index: 21.97 kg/m      Reproductive/Obstetrics negative OB ROS                              Anesthesia Physical Anesthesia Plan  ASA: 3  Anesthesia Plan: General   Post-op Pain Management: Minimal or no pain anticipated   Induction: Intravenous  PONV Risk Score and Plan: 2 and Propofol infusion and TIVA  Airway Management Planned: Natural Airway and Nasal Cannula  Additional Equipment: None  Intra-op Plan:   Post-operative Plan:   Informed Consent: I have reviewed the patients History and Physical, chart, labs and discussed the procedure including the risks, benefits and alternatives for the proposed anesthesia with the patient or authorized representative who has indicated his/her understanding and acceptance.     Dental Advisory Given  Plan Discussed with: CRNA and Surgeon  Anesthesia Plan Comments: (Discussed risks of anesthesia with patient, including possibility of difficulty with spontaneous ventilation under anesthesia necessitating airway intervention, PONV, and rare risks such as cardiac or respiratory or neurological events, and allergic reactions. Discussed the role of CRNA in patient's perioperative care. Patient understands. Patient counseled on benefits of smoking cessation, and increased perioperative risks associated with continued smoking. )         Anesthesia Quick Evaluation

## 2022-11-25 ENCOUNTER — Encounter: Payer: Self-pay | Admitting: Gastroenterology

## 2022-11-25 LAB — SURGICAL PATHOLOGY

## 2022-12-24 ENCOUNTER — Ambulatory Visit: Payer: Medicare HMO | Admitting: Urology

## 2022-12-31 ENCOUNTER — Ambulatory Visit: Payer: Medicare HMO | Admitting: Urology

## 2023-01-03 ENCOUNTER — Other Ambulatory Visit: Payer: Self-pay | Admitting: Otolaryngology

## 2023-01-03 DIAGNOSIS — D106 Benign neoplasm of nasopharynx: Secondary | ICD-10-CM

## 2023-01-13 ENCOUNTER — Other Ambulatory Visit: Payer: Medicare HMO

## 2023-01-15 ENCOUNTER — Ambulatory Visit
Admission: RE | Admit: 2023-01-15 | Discharge: 2023-01-15 | Disposition: A | Discharge status: Home / Self Care | Payer: Medicare HMO | Source: Ambulatory Visit | Attending: Otolaryngology | Admitting: Otolaryngology

## 2023-01-15 DIAGNOSIS — D106 Benign neoplasm of nasopharynx: Secondary | ICD-10-CM

## 2023-01-15 MED ORDER — IOPAMIDOL (ISOVUE-300) INJECTION 61%
75.0000 mL | Freq: Once | INTRAVENOUS | Status: AC | PRN
  Administered 2023-01-15: 75 mL via INTRAVENOUS

## 2023-01-31 ENCOUNTER — Ambulatory Visit (INDEPENDENT_AMBULATORY_CARE_PROVIDER_SITE_OTHER): Payer: Medicare HMO | Admitting: Vascular Surgery

## 2023-01-31 ENCOUNTER — Encounter (INDEPENDENT_AMBULATORY_CARE_PROVIDER_SITE_OTHER): Payer: Self-pay | Admitting: Vascular Surgery

## 2023-01-31 VITALS — BP 171/75 | HR 69 | Resp 16 | Wt 118.6 lb

## 2023-01-31 DIAGNOSIS — I771 Stricture of artery: Secondary | ICD-10-CM

## 2023-01-31 DIAGNOSIS — I10 Essential (primary) hypertension: Secondary | ICD-10-CM | POA: Diagnosis not present

## 2023-01-31 DIAGNOSIS — I6523 Occlusion and stenosis of bilateral carotid arteries: Secondary | ICD-10-CM | POA: Diagnosis not present

## 2023-01-31 NOTE — Assessment & Plan Note (Signed)
Follow with duplex going forward.

## 2023-01-31 NOTE — Assessment & Plan Note (Signed)
I have independently reviewed her CT scan.  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  She does describe symptoms referable to this with left arm pain particular with activity.  No open wounds or infection.  I had a long discussion with she and her mother today regarding the situation.  Generally, we will treat these endovascularly the majority of the time.  A femoral access for left arm angiogram will be planned in the near future at her convenience if they desire attempted intervention.  This is a very bulky calcific plaque, and it may not be amenable to treatment from a femoral approach or even a radial approach endovascularly may have discussed the possibility of surgery at some point although I discussed with them that would be a far more invasive procedure.  We would have to have a long discussion regarding the risks and benefits prior to considering surgical therapy and that would not be done on the same day as the angiogram.  They voiced their understanding and desire to proceed.

## 2023-01-31 NOTE — Progress Notes (Signed)
MRN : [id number]  Sylvia Richardson is a 67 y.o. (05/17/1956) female who presents with chief complaint of  Chief Complaint  Patient presents with   Follow-up    Ct results  .  History of Present Illness: Patient is referred for evaluation of an incidental finding on a recent CT scan of the neck performed for swallowing issues.  Patient's had difficulty eating and has lost weight.  She has a litany of medical issues as listed below.  She does describe pain in her left arm with minimal use.  Particularly activities where she has to frequently use the arm her left arm tires and gives out easily.  I have independently reviewed her CT scan.  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  Current Outpatient Medications  Medication Sig Dispense Refill   aspirin EC 81 MG tablet Take 81 mg by mouth daily.      atenolol (TENORMIN) 100 MG tablet Take 100 mg by mouth daily.  0   clonazePAM (KLONOPIN) 0.5 MG tablet Take 0.5 mg by mouth 4 (four) times daily as needed for anxiety.     DALIRESP 500 MCG TABS tablet TAKE 1 TABLET BY MOUTH EVERY DAY(PLEASE KEEP APPT. IN JULY) 30 tablet 3   DULoxetine (CYMBALTA) 60 MG capsule Take 120 mg by mouth daily.   0   esomeprazole (NEXIUM) 40 MG capsule Take 40 mg by mouth daily after breakfast.     ferrous sulfate 325 (65 FE) MG tablet Take 1 tablet (325 mg total) by mouth daily with breakfast. 30 tablet 3   hydrochlorothiazide (HYDRODIURIL) 25 MG tablet Take 25 mg by mouth daily.     metoCLOPramide (REGLAN) 10 MG tablet Take 10 mg by mouth in the morning, at noon, in the evening, and at bedtime.     omeprazole (PRILOSEC) 40 MG capsule Take 40 mg by mouth every evening.     PROVENTIL HFA 108 (90 Base) MCG/ACT inhaler INHALE 2 PUFFS INTO THE LUNGS EVERY 4 HOURS AS NEEDED FOR WHEEZING OR SHORTNESS OF BREATH 6.7  g 1   traZODone (DESYREL) 100 MG tablet Take 100-200 mg by mouth at bedtime.     montelukast (SINGULAIR) 10 MG tablet Take 10 mg by mouth at bedtime.  (Patient not taking: Reported on 11/22/2022)     No current facility-administered medications for this visit.    Past Medical History:  Diagnosis Date   Anxiety    Bipolar disorder (HCC)    Cancer (HCC)    skin   Carotid artery disease (HCC)    Chronic cystitis    COPD (chronic obstructive pulmonary disease) (HCC)    Depression    Dysphagia    Epigastric pain    GERD (gastroesophageal reflux disease)    H/O degenerative disc disease    Headache    Hypercholesteremia    Hypertension    Severe tobacco use disorder    Social phobia    Stroke [facility])     Past Surgical History:  Procedure Laterality Date   BREAST BIOPSY Left    core bx- neg   CESAREAN SECTION     CHOLECYSTECTOMY     COLONOSCOPY WITH PROPOFOL N/A 03/05/2022   Procedure: COLONOSCOPY WITH PROPOFOL;  Surgeon: Regis Bill, MD;  Location: [facility] ENDOSCOPY;  Service: Endoscopy;  Laterality: N/A;  COLONOSCOPY WITH PROPOFOL N/A 03/12/2022   Procedure: COLONOSCOPY WITH PROPOFOL;  Surgeon: Midge Minium, MD;  Location: [facility] ENDOSCOPY;  Service: Endoscopy;  Laterality: N/A;   COLONOSCOPY WITH PROPOFOL N/A 11/22/2022   Procedure: COLONOSCOPY WITH PROPOFOL;  Surgeon: Regis Bill, MD;  Location: [facility] ENDOSCOPY;  Service: Endoscopy;  Laterality: N/A;   ESOPHAGOGASTRODUODENOSCOPY     ESOPHAGOGASTRODUODENOSCOPY (EGD) WITH PROPOFOL N/A 03/05/2022   Procedure: ESOPHAGOGASTRODUODENOSCOPY (EGD) WITH PROPOFOL;  Surgeon: Regis Bill, MD;  Location: [facility] ENDOSCOPY;  Service: Endoscopy;  Laterality: N/A;   HEMORRHOIDECTOMY WITH HEMORRHOID BANDING     history colonic polyps     Laparoscopic repair paraesophageal hiatal hernia     Nissen Fundoplication     TONSILLECTOMY     TUBAL LIGATION       Social History   Tobacco Use   Smoking status: Every Day    Packs/day:  2.00    Years: 102.00    Additional pack years: 0.00    Total pack years: 204.00    Types: Cigarettes   Smokeless tobacco: Never   Tobacco comments:    10 cigarettes a day   Vaping Use   Vaping Use: Never used  Substance Use Topics   Alcohol use: No   Drug use: No       Family History  Problem Relation Age of Onset   Prostate cancer Father    Stroke Maternal Grandfather    Stroke Paternal Grandfather    Breast cancer Neg Hx      Allergies  Allergen Reactions   Bactrim [Sulfamethoxazole-Trimethoprim] Diarrhea   Ciprofloxacin Anaphylaxis   Clindamycin/Lincomycin Nausea And Vomiting   Zithromax [Azithromycin]     Abdomen pain and acid reflux    Choline Fenofibrate     Other reaction(s): Other (See Comments) Other Reaction: LEFT SIDE TONGUE SWELLING Other reaction(s): Other (See Comments) LEFT SIDE TONGUE SWELLING Other Reaction: LEFT SIDE TONGUE SWELLING   Doxycycline Nausea Only   Fish Oil Nausea Only   Levaquin [Levofloxacin] Rash   Niacin Diarrhea   Nifedipine Diarrhea   Rosuvastatin     Other reaction(s): Headache     REVIEW OF SYSTEMS (Negative unless checked)  Constitutional: [x] Weight loss  [] Fever  [] Chills Cardiac: [] Chest pain   [] Chest pressure   [] Palpitations   [] Shortness of breath when laying flat   [] Shortness of breath at rest   [] Shortness of breath with exertion. Vascular:  [] Pain in legs with walking   [] Pain in legs at rest   [] Pain in legs when laying flat   [] Claudication   [] Pain in feet when walking  [] Pain in feet at rest  [] Pain in feet when laying flat   [] History of DVT   [] Phlebitis   [] Swelling in legs   [] Varicose veins   [] Non-healing ulcers Pulmonary:   [] Uses home oxygen   [] Productive cough   [] Hemoptysis   [] Wheeze  [x] COPD   [] Asthma Neurologic:  [] Dizziness  [] Blackouts   [] Seizures   [x] History of stroke   [] History of TIA  [] Aphasia   [] Temporary blindness   [x] Dysphagia   [x] Weakness or numbness in arms   [] Weakness or  numbness in legs Musculoskeletal:  [x] Arthritis   [] Joint swelling   [x] Joint pain   [] Low back pain Hematologic:  [] Easy bruising  [] Easy bleeding   [] Hypercoagulable state   [x] Anemic   Gastrointestinal:  [] Blood in stool   [] Vomiting blood  [] Gastroesophageal reflux/heartburn   [] Abdominal pain Genitourinary:  [] Chronic kidney disease   [] Difficult urination  [] Frequent  urination  [] Burning with urination   [] Hematuria Skin:  [] Rashes   [] Ulcers   [] Wounds Psychological:  [x] History of anxiety   [x]  History of major depression.  Physical Examination  BP (!) 171/75 (BP Location: Right Arm)   Pulse 69   Resp 16   Wt 118 lb 9.6 oz (53.8 kg)   BMI 20.36 kg/m  Gen:  WD/WN, NAD. Appears much older than stated age. Head: [place]/AT, No temporalis wasting. Ear/Nose/Throat: Hearing grossly intact, nares w/o erythema or drainage Eyes: Conjunctiva clear. Sclera non-icteric Neck: Supple.  Trachea midline Pulmonary:  Good air movement, no use of accessory muscles.  Cardiac: RRR, no JVD Vascular:  Vessel Right Left  Radial 1+ Palpable Not Palpable                   Musculoskeletal: M/S 5/5 throughout.  No deformity or atrophy. No edema. Neurologic: Sensation grossly intact in extremities.  Symmetrical.  Speech is fluent.  Psychiatric: Judgment intact, Mood & affect appropriate for pt's clinical situation. Dermatologic: No rashes or ulcers noted.  No cellulitis or open wounds.      Labs Recent Results (from the past 2160 hour(s))  Surgical pathology     Status: None   Collection Time: 11/22/22  7:59 AM  Result Value Ref Range   SURGICAL PATHOLOGY      SURGICAL PATHOLOGY CASE: [phone] PATIENT: Roetta Sessions Surgical Pathology Report     Specimen Submitted: A. Colon polyp x5, cecum; cold snare B. Colon polyp x2, transverse; cold snare C. Colon polyp, descending; cbx D. Colon polyp x3, sigmoid; cold snare  Clinical History: H/O colonic polyps.  Colon polyps,  diverticulosis, hemorrhoids    DIAGNOSIS: A. COLON POLYPS X 5, CECUM; COLD SNARE: - MULTIPLE FRAGMENTS OF TUBULAR ADENOMAS. - NEGATIVE FOR HIGH-GRADE DYSPLASIA AND MALIGNANCY.  B. COLON POLYPS X 2, TRANSVERSE; COLD SNARE: - FRAGMENTS (X 5) OF TUBULAR ADENOMAS. - NEGATIVE FOR HIGH-GRADE DYSPLASIA AND MALIGNANCY.  C. COLON POLYP, DESCENDING; COLD BIOPSY: - TUBULAR ADENOMA. - NEGATIVE FOR HIGH-GRADE DYSPLASIA AND MALIGNANCY.  D. COLON POLYPS X 3, SIGMOID; COLD SNARE: - SINGLE FRAGMENT OF HYPERPLASTIC POLYP. - FRAGMENTS (X 2) OF BENIGN COLONIC MUCOSA WITH SUPERFICIAL REACTIVE/HYPERPLASTIC CHANGES. - NEGATIVE FOR DYSPLASIA AND MALIGNANCY.  G ROSS DESCRIPTION: A. Labeled: Cold snare polyps x 5 cecum colon Received: Formalin Collection time: 7:59 AM on 11/22/2022 Placed into formalin time: 7:59 AM on 11/22/2022 Tissue fragment(s): Multiple Size: Aggregate, 1.2 x 1.0 x 0.1 cm Description: Tan to pink soft tissue fragments Entirely submitted in 1 cassette.  B. Labeled: Cold snare polyp x 2 transverse colon Received: Formalin Collection time: 8:06 AM on 11/22/2022 Placed into formalin time: 8:06 AM on 11/22/2022 Tissue fragment(s): Multiple Size: Aggregate, 1.7 x 0.3 x 0.1 cm Description: Tan soft tissue fragments Entirely submitted in 1 cassette.  C. Labeled: Cbx polyp x 1 descending colon Received: Formalin Collection time: 8:11 AM on 11/22/2022 Placed into formalin time: 8:11 AM on 11/22/2022 Tissue fragment(s): 1 Size: 0.5 x 0.4 x 0.1 cm Description: Tan soft tissue fragment Entirely submitted in 1 cassette.  D. Labeled: Cold snare polyp x 3 sigmoid colon Received: Formalin Collection time: 8:14 AM on 3/8/ 2024 Placed into formalin time: 8:14 AM on 11/22/2022 Tissue fragment(s): Multiple Size: Aggregate, 1.2 x 0.6 x 0.1 cm Description: Pink soft tissue fragments Entirely submitted in 1 cassette.  CM 11/22/2022  Final Diagnosis performed by Katherine Mantle, MD.   Electronically  signed 11/25/2022 11:21:03AM The electronic signature indicates that the  named Attending Pathologist has evaluated the specimen Technical component performed at [facility], [address] [place], [place] [address] Lab: [phone] Dir: Jolene Schimke, MD, MMM  Professional component performed at [facility], [facility], [address] [place], [place], [place] [address] Lab: [phone] Dir: Beryle Quant, MD     Radiology CT SOFT TISSUE NECK W CONTRAST  Result Date: 01/20/2023 CLINICAL DATA:  Evaluate nasopharyngeal mass EXAM: CT NECK WITH CONTRAST TECHNIQUE: Multidetector CT imaging of the neck was performed using the standard protocol following the bolus administration of intravenous contrast. RADIATION DOSE REDUCTION: This exam was performed according to the departmental dose-optimization program which includes automated exposure control, adjustment of the mA and/or kV according to patient size and/or use of iterative reconstruction technique. CONTRAST:  75mL ISOVUE-300 IOPAMIDOL (ISOVUE-300) INJECTION 61% COMPARISON:  None Available. FINDINGS: Pharynx and larynx: Cystic density mass in the midline nasopharynx measuring 11 mm with either septated or adjacent cysts, unchanged since 2016. No enhancing mass is seen. Salivary glands: No inflammation, mass, or stone. Thyroid: Normal. Lymph nodes: None enlarged or heterogeneous density peer Vascular: Severe atherosclerosis with occlusion or pre occlusion of the proximal left subclavian artery. High-grade distal left vertebral stenosis is also noted, direction of flow uncertain on this scan. Limited intracranial: Negative Visualized orbits: Minimal coverage Mastoids and visualized paranasal sinuses: Diffusely clear Skeleton: Ordinary cervical spine degeneration. Upper chest: Centrilobular emphysema. IMPRESSION: 1. 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 2. Severe atherosclerosis with occlusion  or pre occlusion of the proximal left subclavian artery. Extensive atheromatous narrowing of the left vertebral artery as well. 3. Emphysema. Electronically Signed   By: Tiburcio Pea M.D.   On: 01/20/2023 09:13    Assessment/Plan  Bilateral carotid artery stenosis Follow with duplex going forward.  Hypertension blood pressure control important in reducing the progression of atherosclerotic disease. On appropriate oral medications.   Subclavian arterial stenosis (HCC) I have independently reviewed her CT scan.  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  She does describe symptoms referable to this with left arm pain particular with activity.  No open wounds or infection.  I had a long discussion with she and her mother today regarding the situation.  Generally, we will treat these endovascularly the majority of the time.  A femoral access for left arm angiogram will be planned in the near future at her convenience if they desire attempted intervention.  This is a very bulky calcific plaque, and it may not be amenable to treatment from a femoral approach or even a radial approach endovascularly may have discussed the possibility of surgery at some point although I discussed with them that would be a far more invasive procedure.  We would have to have a long discussion regarding the risks and benefits prior to considering surgical therapy and that would not be done on the same day as the angiogram.  They voiced their understanding and desire to proceed.    Festus Barren, MD  01/31/2023 2:31 PM    This note was created with Dragon medical transcription system.  Any errors from dictation are purely unintentional

## 2023-01-31 NOTE — Assessment & Plan Note (Signed)
blood pressure control important in reducing the progression of atherosclerotic disease. On appropriate oral medications.  

## 2023-02-09 ENCOUNTER — Emergency Department: Payer: Medicare HMO

## 2023-02-09 ENCOUNTER — Other Ambulatory Visit: Payer: Self-pay

## 2023-02-09 ENCOUNTER — Emergency Department
Admission: EM | Admit: 2023-02-09 | Discharge: 2023-02-09 | Disposition: A | Discharge status: Home / Self Care | Payer: Medicare HMO | Attending: Student in an Organized Health Care Education/Training Program | Admitting: Student in an Organized Health Care Education/Training Program

## 2023-02-09 DIAGNOSIS — R42 Dizziness and giddiness: Secondary | ICD-10-CM | POA: Diagnosis not present

## 2023-02-09 DIAGNOSIS — R55 Syncope and collapse: Secondary | ICD-10-CM | POA: Insufficient documentation

## 2023-02-09 DIAGNOSIS — R197 Diarrhea, unspecified: Secondary | ICD-10-CM | POA: Diagnosis not present

## 2023-02-09 DIAGNOSIS — E860 Dehydration: Secondary | ICD-10-CM | POA: Diagnosis present

## 2023-02-09 DIAGNOSIS — R109 Unspecified abdominal pain: Secondary | ICD-10-CM | POA: Insufficient documentation

## 2023-02-09 DIAGNOSIS — R0602 Shortness of breath: Secondary | ICD-10-CM | POA: Diagnosis not present

## 2023-02-09 DIAGNOSIS — R059 Cough, unspecified: Secondary | ICD-10-CM | POA: Insufficient documentation

## 2023-02-09 LAB — LIPASE, BLOOD: Lipase: 49 U/L (ref 11–51)

## 2023-02-09 LAB — CBC WITH DIFFERENTIAL/PLATELET
Abs Immature Granulocytes: 0.03 10*3/uL (ref 0.00–0.07)
Basophils Absolute: 0 10*3/uL (ref 0.0–0.1)
Basophils Relative: 1 %
Eosinophils Absolute: 0.2 10*3/uL (ref 0.0–0.5)
Eosinophils Relative: 4 %
HCT: 42.5 % (ref 36.0–46.0)
Hemoglobin: 14.4 g/dL (ref 12.0–15.0)
Immature Granulocytes: 1 %
Lymphocytes Relative: 22 %
Lymphs Abs: 1.4 10*3/uL (ref 0.7–4.0)
MCH: 30.6 pg (ref 26.0–34.0)
MCHC: 33.9 g/dL (ref 30.0–36.0)
MCV: 90.2 fL (ref 80.0–100.0)
Monocytes Absolute: 0.5 10*3/uL (ref 0.1–1.0)
Monocytes Relative: 7 %
Neutro Abs: 4.1 10*3/uL (ref 1.7–7.7)
Neutrophils Relative %: 65 %
Platelets: 184 10*3/uL (ref 150–400)
RBC: 4.71 MIL/uL (ref 3.87–5.11)
RDW: 12.2 % (ref 11.5–15.5)
WBC: 6.2 10*3/uL (ref 4.0–10.5)
nRBC: 0 % (ref 0.0–0.2)

## 2023-02-09 LAB — COMPREHENSIVE METABOLIC PANEL
ALT: 25 U/L (ref 0–44)
AST: 26 U/L (ref 15–41)
Albumin: 3.4 g/dL — ABNORMAL LOW (ref 3.5–5.0)
Alkaline Phosphatase: 64 U/L (ref 38–126)
Anion gap: 6 (ref 5–15)
BUN: 13 mg/dL (ref 8–23)
CO2: 28 mmol/L (ref 22–32)
Calcium: 9.4 mg/dL (ref 8.9–10.3)
Chloride: 96 mmol/L — ABNORMAL LOW (ref 98–111)
Creatinine, Ser: 0.46 mg/dL (ref 0.44–1.00)
GFR, Estimated: >60 mL/min (ref >60)
Glucose, Bld: 134 mg/dL — ABNORMAL HIGH (ref 70–99)
Potassium: 4.3 mmol/L (ref 3.5–5.1)
Sodium: 130 mmol/L — ABNORMAL LOW (ref 135–145)
Total Bilirubin: 0.4 mg/dL (ref 0.3–1.2)
Total Protein: 6.4 g/dL — ABNORMAL LOW (ref 6.5–8.1)

## 2023-02-09 LAB — TROPONIN I (HIGH SENSITIVITY)
Troponin I (High Sensitivity): 3 ng/L (ref <18)
Troponin I (High Sensitivity): 3 ng/L (ref <18)

## 2023-02-09 MED ORDER — SODIUM CHLORIDE 0.9 % IV BOLUS
500.0000 mL | Freq: Once | INTRAVENOUS | Status: AC
  Administered 2023-02-09: 500 mL via INTRAVENOUS

## 2023-02-09 MED ORDER — IOHEXOL 350 MG/ML SOLN: 75.0000 mL | Freq: Once | INTRAVENOUS | Status: DC | PRN

## 2023-02-09 MED ORDER — IOHEXOL 300 MG/ML  SOLN
100.0000 mL | Freq: Once | INTRAMUSCULAR | Status: AC | PRN
  Administered 2023-02-09: 100 mL via INTRAVENOUS

## 2023-02-09 NOTE — ED Provider Notes (Signed)
[facility] Provider Note    Event Date/Time   First MD Initiated Contact with Patient 02/09/23 1223     (approximate)   History   Near Syncope   HPI  Sylvia Richardson is a 67 y.o. female Modena Jansky to the ER for evaluation abdominal pain cough, shortness of breath diarrhea for the past several days.  Feeling weak and lightheaded.  Was just recently started on lisinopril and she feels that that is causing most of her symptoms.  Did have a history of diverticulosis.  No measured temperature.  No recent antibiotics.     Physical Exam   Triage Vital Signs: ED Triage Vitals [02/09/23 1227]  Enc Vitals Group     BP (!) 147/62     Pulse Rate 73     Resp 16     Temp 98.1 F (36.7 C)     Temp Source Oral     SpO2 97 %     Weight      Height      Head Circumference      Peak Flow      Pain Score      Pain Loc      Pain Edu?      Excl. in GC?     Most recent vital signs: Vitals:   02/09/23 1227 02/09/23 1230  BP: (!) 147/62 (!) 147/62  Pulse: 73   Resp: 16 16  Temp: 98.1 F (36.7 C) 98.5 F (36.9 C)  SpO2: 97% 94%     Constitutional: Alert  Eyes: Conjunctivae are normal.  Head: Atraumatic. Nose: No congestion/rhinnorhea. Mouth/Throat: Mucous membranes are moist.   Neck: Painless ROM.  Cardiovascular:   Good peripheral circulation. Respiratory: Normal respiratory effort.  No retractions.  Gastrointestinal: Soft and nontender.  Musculoskeletal:  no deformity Neurologic:  MAE spontaneously. No gross focal neurologic deficits are appreciated.  Skin:  Skin is warm, dry and intact. No rash noted. Psychiatric: Mood and affect are normal. Speech and behavior are normal.    ED Results / Procedures / Treatments   Labs (all labs ordered are listed, but only abnormal results are displayed) Labs Reviewed  COMPREHENSIVE METABOLIC PANEL - Abnormal; Notable for the following components:      Result Value   Sodium 130 (*)    Chloride 96  (*)    Glucose, Bld 134 (*)    Total Protein 6.4 (*)    Albumin 3.4 (*)    All other components within normal limits  CBC WITH DIFFERENTIAL/PLATELET  LIPASE, BLOOD  URINALYSIS, ROUTINE W REFLEX MICROSCOPIC  TROPONIN I (HIGH SENSITIVITY)  TROPONIN I (HIGH SENSITIVITY)     EKG  ED ECG REPORT I, Willy Eddy, the attending physician, personally viewed and interpreted this ECG.   Date: 02/09/2023  EKG Time: 13:03  Rate: 70  Rhythm: sinus  Axis: normal  Intervals: normal  ST&T Change: no stemi, nonspecific st abn    RADIOLOGY Please see ED Course for my review and interpretation.  I personally reviewed all radiographic images ordered to evaluate for the above acute complaints and reviewed radiology reports and findings.  These findings were personally discussed with the patient.  Please see medical record for radiology report.    PROCEDURES:  Critical Care performed: No  Procedures   MEDICATIONS ORDERED IN ED: Medications  sodium chloride 0.9 % bolus 500 mL (0 mLs Intravenous Stopped 02/09/23 1611)  iohexol (OMNIPAQUE) 300 MG/ML solution 100 mL (100 mLs Intravenous Contrast Given 02/09/23  1412)     IMPRESSION / MDM / ASSESSMENT AND PLAN / ED COURSE  I reviewed the triage vital signs and the nursing notes.                              Differential diagnosis includes, but is not limited to, dehydration, medication effect, anemia, diverticulitis, colitis, appendicitis, dysrhythmia, pneumonia  Patient presenting to the ER for evaluation of symptoms as described above.  Based on symptoms, risk factors and considered above differential, this presenting complaint could reflect a potentially life-threatening illness therefore the patient will be placed on continuous pulse oximetry and telemetry for monitoring.  Laboratory evaluation will be sent to evaluate for the above complaints.  Patient nontoxic-appearing.  She is highly suspicious that she is having reaction to her  lisinopril.  No sign of angioedema.  May be having some intolerance to the medication we will give some IV fluids possible dehydration.  Does have some abdominal pain will order CT imaging.  Will order chest x-ray and will reassess.   Clinical Course as of 02/09/23 1657  Sun Feb 09, 2023  1249 Chest x-ray on my review and interpretation without evidence of consolidation or pneumothorax. [PR]  1329 Patient blood work is reassuring [PR]  1426 CT Imaging by my review and interpretation without evidence obstructive pattern abscess or perforation.  Will await formal radiology report. [PR]  1654 Repeat troponin negative.  Patient well-appearing feels improved after some IV hydration.  Does appear stable appropriate for outpatient follow-up. [PR]    Clinical Course User Index [PR] Willy Eddy, MD     FINAL CLINICAL IMPRESSION(S) / ED DIAGNOSES   Final diagnoses:  Dehydration     Rx / DC Orders   ED Discharge Orders     None        Note:  This document was prepared using Dragon voice recognition software and may include unintentional dictation errors.    Willy Eddy, MD 02/09/23 [phone]

## 2023-02-09 NOTE — Discharge Instructions (Signed)
IMPRESSION:  1. No acute findings within the abdomen or pelvis.  2. Prior gastric surgery.  3. 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 No  specific follow-up recommendations. This recommendation follows ACR  consensus guidelines: White Paper of the ACR Incidental Findings  Committee II on Vascular Findings. J Am Coll Radiol 2013;  45:409-811.  4. Aortic atherosclerosis.  5. Two too small to be actually characterized hypoattenuated lesions  in the left kidney.    Aortic Atherosclerosis (ICD10-I70.0).

## 2023-02-09 NOTE — ED Notes (Signed)
Call light is on the bed rail. Patient instructed to call when she needs to void.

## 2023-02-09 NOTE — ED Triage Notes (Addendum)
Per EMS report, patient c/o dizziness, weakness, diarrhea for 3 days after she began Lisinopril. Patient c/o feeling nauseous. Patient is alert and oriented x4. Patient also c/o a cough since starting Lisinopril.   B/P 128/52 75 pulse 94%  Normal sinus on strip obtained from EMT.

## 2023-02-09 NOTE — ED Notes (Signed)
Patient given a cup of water per request and with Dr. Danella Penton approval.

## 2023-08-28 ENCOUNTER — Encounter: Payer: Self-pay | Admitting: Ophthalmology

## 2023-08-29 NOTE — Anesthesia Preprocedure Evaluation (Signed)
Anesthesia Evaluation  Patient identified by MRN, date of birth, ID band Patient awake    Reviewed: Allergy & Precautions, H&P , NPO status , Patient's Chart, lab work & pertinent test results  Airway Mallampati: III  TM Distance: >3 FB Neck ROM: Full    Dental no notable dental hx.    Pulmonary asthma , COPD, Current Smoker and Patient abstained from smoking. Note from office visit 08-22-23: COPD and emphysema. She is dyspneic on exertion and is with mMRC at 3-4. She has smoked since age 12 and smokes actively 2 packs daily. She is now with appx 100pack years.   Pulmonary exam normal breath sounds clear to auscultation       Cardiovascular hypertension, + Peripheral Vascular Disease  Normal cardiovascular exam Rhythm:Regular Rate:Normal     Neuro/Psych  Headaches PSYCHIATRIC DISORDERS Anxiety Depression Bipolar Disorder   TIACVA negative neurological ROS  negative psych ROS   GI/Hepatic negative GI ROS, Neg liver ROS,GERD  ,,  Endo/Other  negative endocrine ROS    Renal/GU Renal diseasenegative Renal ROS  negative genitourinary   Musculoskeletal negative musculoskeletal ROS (+) Arthritis ,    Abdominal   Peds negative pediatric ROS (+)  Hematology negative hematology ROS (+)   Anesthesia Other Findings S/P nissen fundoplication Hypertension  COPD (chronic obstructive pulmonary disease) (HCC) H/O degenerative disc disease Hypercholesteremia Bipolar disorder (HCC)  Stroke (HCC) Carotid artery disease (HCC)  Cancer (HCC) Epigastric pain  Dysphagia Anxiety  Severe tobacco use disorder Social phobia  Headache Chronic cystitis  Depression Severe GERD (gastroesophageal reflux disease)  Wears hearing aid in both ears TIA 07-06-14 Social phobia   Reproductive/Obstetrics negative OB ROS                             Anesthesia Physical Anesthesia Plan  ASA: 3  Anesthesia Plan: MAC    Post-op Pain Management:    Induction: Intravenous  PONV Risk Score and Plan:   Airway Management Planned: Natural Airway and Nasal Cannula  Additional Equipment:   Intra-op Plan:   Post-operative Plan:   Informed Consent: I have reviewed the patients History and Physical, chart, labs and discussed the procedure including the risks, benefits and alternatives for the proposed anesthesia with the patient or authorized representative who has indicated his/her understanding and acceptance.     Dental Advisory Given  Plan Discussed with: Anesthesiologist, CRNA and Surgeon  Anesthesia Plan Comments: (Patient consented for risks of anesthesia including but not limited to:  - adverse reactions to medications - damage to eyes, teeth, lips or other oral mucosa - nerve damage due to positioning  - sore throat or hoarseness - Damage to heart, brain, nerves, lungs, other parts of body or loss of life  Patient voiced understanding and assent.)       Anesthesia Quick Evaluation

## 2023-09-02 NOTE — Discharge Instructions (Signed)

## 2023-09-04 ENCOUNTER — Encounter
Admission: RE | Disposition: A | Discharge status: Home / Self Care | Payer: Self-pay | Source: Home / Self Care | Attending: Ophthalmology

## 2023-09-04 ENCOUNTER — Ambulatory Visit: Payer: Self-pay | Admitting: Anesthesiology

## 2023-09-04 ENCOUNTER — Other Ambulatory Visit: Payer: Self-pay

## 2023-09-04 ENCOUNTER — Ambulatory Visit: Payer: Medicare HMO | Admitting: Anesthesiology

## 2023-09-04 ENCOUNTER — Ambulatory Visit
Admission: RE | Admit: 2023-09-04 | Discharge: 2023-09-04 | Disposition: A | Discharge status: Home / Self Care | Payer: Medicare HMO | Attending: Ophthalmology | Admitting: Ophthalmology

## 2023-09-04 ENCOUNTER — Encounter: Payer: Self-pay | Admitting: Ophthalmology

## 2023-09-04 DIAGNOSIS — K219 Gastro-esophageal reflux disease without esophagitis: Secondary | ICD-10-CM | POA: Diagnosis not present

## 2023-09-04 DIAGNOSIS — I739 Peripheral vascular disease, unspecified: Secondary | ICD-10-CM | POA: Diagnosis not present

## 2023-09-04 DIAGNOSIS — J439 Emphysema, unspecified: Secondary | ICD-10-CM | POA: Insufficient documentation

## 2023-09-04 DIAGNOSIS — J449 Chronic obstructive pulmonary disease, unspecified: Secondary | ICD-10-CM | POA: Insufficient documentation

## 2023-09-04 DIAGNOSIS — Z8673 Personal history of transient ischemic attack (TIA), and cerebral infarction without residual deficits: Secondary | ICD-10-CM | POA: Insufficient documentation

## 2023-09-04 DIAGNOSIS — I10 Essential (primary) hypertension: Secondary | ICD-10-CM | POA: Diagnosis not present

## 2023-09-04 DIAGNOSIS — F319 Bipolar disorder, unspecified: Secondary | ICD-10-CM | POA: Diagnosis not present

## 2023-09-04 DIAGNOSIS — F17210 Nicotine dependence, cigarettes, uncomplicated: Secondary | ICD-10-CM | POA: Diagnosis not present

## 2023-09-04 DIAGNOSIS — F419 Anxiety disorder, unspecified: Secondary | ICD-10-CM | POA: Diagnosis not present

## 2023-09-04 DIAGNOSIS — M1990 Unspecified osteoarthritis, unspecified site: Secondary | ICD-10-CM | POA: Insufficient documentation

## 2023-09-04 DIAGNOSIS — H2512 Age-related nuclear cataract, left eye: Secondary | ICD-10-CM | POA: Diagnosis present

## 2023-09-04 HISTORY — DX: Presence of external hearing-aid: Z97.4

## 2023-09-04 HISTORY — DX: Unspecified asthma, uncomplicated: J45.909

## 2023-09-04 HISTORY — PX: CATARACT EXTRACTION W/PHACO: SHX586

## 2023-09-04 HISTORY — DX: Transient cerebral ischemic attack, unspecified: G45.9

## 2023-09-04 HISTORY — DX: Lichen sclerosus et atrophicus: L90.0

## 2023-09-04 HISTORY — DX: Atherosclerosis of aorta: I70.0

## 2023-09-04 HISTORY — DX: Other specified chronic obstructive pulmonary disease: J44.89

## 2023-09-04 SURGERY — PHACOEMULSIFICATION, CATARACT, WITH IOL INSERTION
Anesthesia: Monitor Anesthesia Care | Laterality: Left

## 2023-09-04 MED ORDER — TETRACAINE HCL 0.5 % OP SOLN
OPHTHALMIC | Status: AC
Start: 2023-09-04 — End: ?
  Filled 2023-09-04: qty 4

## 2023-09-04 MED ORDER — SIGHTPATH DOSE#1 BSS IO SOLN
INTRAOCULAR | Status: DC | PRN
  Administered 2023-09-04: 15 mL via INTRAOCULAR

## 2023-09-04 MED ORDER — FENTANYL CITRATE (PF) 100 MCG/2ML IJ SOLN
INTRAMUSCULAR | Status: DC | PRN
  Administered 2023-09-04 (×2): 50 ug via INTRAVENOUS

## 2023-09-04 MED ORDER — LIDOCAINE HCL (PF) 2 % IJ SOLN
INTRAOCULAR | Status: DC | PRN
  Administered 2023-09-04: 1 mL via INTRAOCULAR

## 2023-09-04 MED ORDER — SIGHTPATH DOSE#1 BSS IO SOLN
INTRAOCULAR | Status: DC | PRN
  Administered 2023-09-04: 88 mL via OPHTHALMIC

## 2023-09-04 MED ORDER — MIDAZOLAM HCL 2 MG/2ML IJ SOLN
INTRAMUSCULAR | Status: DC | PRN
  Administered 2023-09-04: 2 mg via INTRAVENOUS

## 2023-09-04 MED ORDER — POLYMYXIN B-TRIMETHOPRIM 10000-0.1 UNIT/ML-% OP SOLN
OPHTHALMIC | Status: DC | PRN
  Administered 2023-09-04: 2 [drp] via OPHTHALMIC

## 2023-09-04 MED ORDER — BRIMONIDINE TARTRATE-TIMOLOL 0.2-0.5 % OP SOLN
OPHTHALMIC | Status: DC | PRN
  Administered 2023-09-04: 1 [drp] via OPHTHALMIC

## 2023-09-04 MED ORDER — ARMC OPHTHALMIC DILATING DROPS
OPHTHALMIC | Status: AC
  Filled 2023-09-04: qty 0.5

## 2023-09-04 MED ORDER — SIGHTPATH DOSE#1 NA HYALUR & NA CHOND-NA HYALUR IO KIT
PACK | INTRAOCULAR | Status: DC | PRN
  Administered 2023-09-04: 1 via OPHTHALMIC

## 2023-09-04 MED ORDER — MIDAZOLAM HCL 2 MG/2ML IJ SOLN
INTRAMUSCULAR | Status: AC
  Filled 2023-09-04: qty 2

## 2023-09-04 MED ORDER — FENTANYL CITRATE (PF) 100 MCG/2ML IJ SOLN
INTRAMUSCULAR | Status: AC
  Filled 2023-09-04: qty 2

## 2023-09-04 MED ORDER — TETRACAINE HCL 0.5 % OP SOLN
1.0000 [drp] | OPHTHALMIC | Status: DC | PRN
  Administered 2023-09-04 (×3): 1 [drp] via OPHTHALMIC

## 2023-09-04 MED ORDER — ARMC OPHTHALMIC DILATING DROPS
1.0000 | OPHTHALMIC | Status: DC | PRN
  Administered 2023-09-04 (×3): 1 via OPHTHALMIC

## 2023-09-04 SURGICAL SUPPLY — 10 items
CATARACT SUITE SIGHTPATH (MISCELLANEOUS) ×1
DISSECTOR HYDRO NUCLEUS 50X22 (MISCELLANEOUS) ×1 IMPLANT
DRSG TEGADERM 2-3/8X2-3/4 SM (GAUZE/BANDAGES/DRESSINGS) ×1 IMPLANT
FEE CATARACT SUITE SIGHTPATH (MISCELLANEOUS) ×1 IMPLANT
GLOVE SURG SYN 7.5 E (GLOVE) ×1
GLOVE SURG SYN 7.5 PF PI (GLOVE) ×1 IMPLANT
GLOVE SURG SYN 8.5 E (GLOVE) ×1
GLOVE SURG SYN 8.5 PF PI (GLOVE) ×1 IMPLANT
LENS IOL RAYNER 19.5 (Intraocular Lens) ×1 IMPLANT
LENS IOL RAYONE EMV 19.5 (Intraocular Lens) IMPLANT

## 2023-09-04 NOTE — Transfer of Care (Signed)
Immediate Anesthesia Transfer of Care Note  Patient: Sylvia Richardson  Procedure(s) Performed: CATARACT EXTRACTION PHACO AND INTRAOCULAR LENS PLACEMENT (IOC) LEFT 10.48 01.09.7 (Left)  Patient Location: PACU  Anesthesia Type: MAC  Level of Consciousness: awake, alert  and patient cooperative  Airway and Oxygen Therapy: Patient Spontanous Breathing and Patient connected to supplemental oxygen  Post-op Assessment: Post-op Vital signs reviewed, Patient's Cardiovascular Status Stable, Respiratory Function Stable, Patent Airway and No signs of Nausea or vomiting  Post-op Vital Signs: Reviewed and stable  Complications: No notable events documented.

## 2023-09-04 NOTE — Op Note (Signed)
OPERATIVE NOTE  Sylvia Richardson [id number] 09/04/2023   PREOPERATIVE DIAGNOSIS: Nuclear sclerotic cataract left eye. H25.12   POSTOPERATIVE DIAGNOSIS: Nuclear sclerotic cataract left eye. H25.12   PROCEDURE:  Phacoemusification with posterior chamber intraocular lens placement of the left eye  Ultrasound time: Procedure(s): CATARACT EXTRACTION PHACO AND INTRAOCULAR LENS PLACEMENT (IOC) LEFT 10.48 01.09.7 (Left)  LENS:   Implant Name Type Inv. Item Serial No. Manufacturer Lot No. LRB No. Used Action  LENS IOL RAYNER 19.5 - [id number] Intraocular Lens LENS IOL RAYNER 19.5  S[employer] [id number] Left 1 Implanted      SURGEON:  Julious Payer. [name], MD   ANESTHESIA:  Topical with tetracaine drops, augmented with 1% preservative-free intracameral lidocaine.   COMPLICATIONS:  None.   DESCRIPTION OF PROCEDURE:  The patient was identified in the holding room and transported to the operating room and placed in the supine position under the operating microscope.  The left eye was identified as the operative eye, which was prepped and draped in the usual sterile ophthalmic fashion.   A 1 millimeter clear-corneal paracentesis was made inferotemporally. Preservative-free 1% lidocaine mixed with 1:1,000 bisulfite-free aqueous solution of epinephrine was injected into the anterior chamber. The anterior chamber was then filled with Viscoat viscoelastic. A 2.4 millimeter keratome was used to make a clear-corneal incision superotemporally. A curvilinear capsulorrhexis was made with a cystotome and capsulorrhexis forceps. Balanced salt solution was used to hydrodissect and hydrodelineate the nucleus. Phacoemulsification was then used to remove the lens nucleus and epinucleus. The remaining cortex was then removed using the irrigation and aspiration handpiece. Provisc was then placed into the capsular bag to distend it for lens placement. A +19.50 D [id number] intraocular lens was then injected into the  capsular bag. The remaining viscoelastic was aspirated.   Wounds were hydrated with balanced salt solution.  The anterior chamber was inflated to a physiologic pressure with balanced salt solution.  No wound leaks were noted. Vigamox was injected intracamerally.  Timolol and Brimonidine drops were applied to the eye.  The patient was taken to the recovery room in stable condition without complications of anesthesia or surgery.  Rolly Pancake [place] 09/04/2023, 12:16 PM

## 2023-09-04 NOTE — H&P (Signed)
[facility]   Primary Care Physician:  Orene Desanctis, MD Ophthalmologist: Dr. Deberah Pelton  Pre-Procedure History & Physical: HPI:  Sylvia Richardson is a 67 y.o. female here for cataract surgery.   Past Medical History:  Diagnosis Date   Anxiety    Bipolar disorder (HCC)    Cancer (HCC)    skin   Carotid artery disease (HCC)    Chronic cystitis    COPD (chronic obstructive pulmonary disease) (HCC)    Depression    Dysphagia    Epigastric pain    GERD (gastroesophageal reflux disease)    H/O degenerative disc disease    Headache    Hypercholesteremia    Hypertension    Severe tobacco use disorder    Social phobia    Stroke [facility])    TIA several yrs ago.  no deficits   Wears hearing aid in both ears    Has.  does not wear.    Past Surgical History:  Procedure Laterality Date   BREAST BIOPSY Left    core bx- neg   CESAREAN SECTION     CHOLECYSTECTOMY     COLONOSCOPY WITH PROPOFOL N/A 03/05/2022   Procedure: COLONOSCOPY WITH PROPOFOL;  Surgeon: Regis Bill, MD;  Location: [facility] ENDOSCOPY;  Service: Endoscopy;  Laterality: N/A;   COLONOSCOPY WITH PROPOFOL N/A 03/12/2022   Procedure: COLONOSCOPY WITH PROPOFOL;  Surgeon: Midge Minium, MD;  Location: [facility] ENDOSCOPY;  Service: Endoscopy;  Laterality: N/A;   COLONOSCOPY WITH PROPOFOL N/A 11/22/2022   Procedure: COLONOSCOPY WITH PROPOFOL;  Surgeon: Regis Bill, MD;  Location: [facility] ENDOSCOPY;  Service: Endoscopy;  Laterality: N/A;   ESOPHAGOGASTRODUODENOSCOPY     ESOPHAGOGASTRODUODENOSCOPY (EGD) WITH PROPOFOL N/A 03/05/2022   Procedure: ESOPHAGOGASTRODUODENOSCOPY (EGD) WITH PROPOFOL;  Surgeon: Regis Bill, MD;  Location: [facility] ENDOSCOPY;  Service: Endoscopy;  Laterality: N/A;   HEMORRHOIDECTOMY WITH HEMORRHOID BANDING     history colonic polyps     Laparoscopic repair paraesophageal hiatal hernia     Nissen Fundoplication     TONSILLECTOMY     TUBAL LIGATION      Prior to Admission  medications   Medication Sig Start Date End Date Taking? Authorizing Provider  aspirin EC 81 MG tablet Take 81 mg by mouth daily.  10/02/10  Yes [provider]  atenolol (TENORMIN) 100 MG tablet Take 100 mg by mouth daily.   Yes [provider]  clonazePAM (KLONOPIN) 0.5 MG tablet Take 1 mg by mouth 4 (four) times daily as needed for anxiety.   Yes [provider]  DALIRESP 500 MCG TABS tablet TAKE 1 TABLET BY MOUTH EVERY DAY(PLEASE KEEP APPT. IN JULY) 02/28/21  Yes Yevonne Pax, MD  DULoxetine (CYMBALTA) 60 MG capsule Take 120 mg by mouth daily.    Yes [provider]  esomeprazole (NEXIUM) 40 MG capsule Take 40 mg by mouth daily after breakfast. 12/02/22 01/14/24 Yes [provider]  ibuprofen (ADVIL) 200 MG tablet Take 200 mg by mouth every 6 (six) hours as needed.   Yes [provider]  metoCLOPramide (REGLAN) 10 MG tablet Take 10 mg by mouth in the morning, at noon, in the evening, and at bedtime.   Yes [provider]  omeprazole (PRILOSEC) 40 MG capsule Take 40 mg by mouth every evening. 03/06/22  Yes [provider]  predniSONE (DELTASONE) 10 MG tablet Take 10 mg by mouth daily as needed.   Yes [provider]  PROVENTIL HFA 108 (90 Base) MCG/ACT inhaler  INHALE 2 PUFFS INTO THE LUNGS EVERY 4 HOURS AS NEEDED FOR WHEEZING OR SHORTNESS OF BREATH 05/01/20  Yes Yevonne Pax, MD  traZODone (DESYREL) 100 MG tablet Take 100-200 mg by mouth at bedtime.   Yes [provider]    Allergies as of 08/18/2023 - Review Complete 02/09/2023  Allergen Reaction Noted   Bactrim [sulfamethoxazole-trimethoprim] Diarrhea 07/09/2018   Ciprofloxacin Anaphylaxis 08/26/2015   Clindamycin/lincomycin Nausea And Vomiting 11/24/2018   Zithromax [azithromycin]  06/08/2020   Choline fenofibrate  11/17/2014   Doxycycline Nausea Only 06/19/2016   Fish oil Nausea Only 01/23/2016   Levaquin [levofloxacin] Rash 03/31/2018   Niacin  Diarrhea 04/25/2015   Nifedipine Diarrhea 03/23/2014   Rosuvastatin  04/25/2015    Family History  Problem Relation Age of Onset   Prostate cancer Father    Stroke Maternal Grandfather    Stroke Paternal Grandfather    Breast cancer Neg Hx     Social History   Socioeconomic History   Marital status: Divorced    Spouse name: Not on file   Number of children: Not on file   Years of education: Not on file   Highest education level: Not on file  Occupational History   Not on file  Tobacco Use   Smoking status: Every Day    Current packs/day: 1.00    Average packs/day: 2.0 packs/day for 47.0 years (93.5 ttl pk-yrs)    Types: Cigarettes    Start date: 1978   Smokeless tobacco: Never   Tobacco comments:    10 cigarettes a day   Vaping Use   Vaping status: Never Used  Substance and Sexual Activity   Alcohol use: No   Drug use: No   Sexual activity: Not on file  Other Topics Concern   Not on file  Social History Narrative   Not on file   Social Drivers of Health   Financial Resource Strain: Low Risk  (08/25/2023)   Received from [facility]   Overall Financial Resource Strain (CARDIA)    Difficulty of Paying Living Expenses: Not very hard  Food Insecurity: No Food Insecurity (08/25/2023)   Received from [facility]   Hunger Vital Sign    Worried About Running Out of Food in the Last Year: Never true    Ran Out of Food in the Last Year: Never true  Transportation Needs: Unmet Transportation Needs (08/25/2023)   Received from [facility] - Transportation    In the past 12 months, has lack of transportation kept you from medical appointments or from getting medications?: Yes    Lack of Transportation (Non-Medical): Yes  Physical Activity: Inactive (08/30/2021)   Received from [facility], [facility]   Exercise Vital Sign    Days of Exercise per Week: 0 days    Minutes  of Exercise per Session: 0 min  Stress: Stress Concern Present (08/30/2021)   Received from [facility], [facility]   [employer] of Occupational Health - Occupational Stress Questionnaire    Feeling of Stress : Rather much  Social Connections: Socially Isolated (08/30/2021)   Received from [facility], [facility]   Social Connection and Isolation Panel [NHANES]    Frequency of Communication with Friends and Family: More than three times a week    Frequency of Social Gatherings with Friends and Family: More than three times a week  Attends Religious Services: Never    Active Member of Clubs or Organizations: No    Attends Banker Meetings: Never    Marital Status: Divorced  Catering manager Violence: Not on file    Review of Systems: See HPI, otherwise negative ROS  Physical Exam: Ht 5\' 4"  (1.626 m)   Wt 53.1 kg   BMI 20.08 kg/m  General:   Alert, cooperative in NAD Head:  Normocephalic and atraumatic. Respiratory:  Normal work of breathing. Cardiovascular:  RRR  Impression/Plan: Sylvia Richardson is here for cataract surgery.  Risks, benefits, limitations, and alternatives regarding cataract surgery have been reviewed with the patient.  Questions have been answered.  All parties agreeable.   Estanislado Pandy, MD  09/04/2023, 7:13 AM

## 2023-09-05 ENCOUNTER — Encounter: Payer: Self-pay | Admitting: Ophthalmology

## 2023-09-05 NOTE — Anesthesia Postprocedure Evaluation (Signed)
Anesthesia Post Note  Patient: Sylvia Richardson  Procedure(s) Performed: CATARACT EXTRACTION PHACO AND INTRAOCULAR LENS PLACEMENT (IOC) LEFT 10.48 01.09.7 (Left)  Patient location during evaluation: PACU Anesthesia Type: MAC Level of consciousness: awake and alert Pain management: pain level controlled Vital Signs Assessment: post-procedure vital signs reviewed and stable Respiratory status: spontaneous breathing, nonlabored ventilation, respiratory function stable and patient connected to nasal cannula oxygen Cardiovascular status: stable and blood pressure returned to baseline Postop Assessment: no apparent nausea or vomiting Anesthetic complications: no   No notable events documented.   Last Vitals:  Vitals:   09/04/23 1217 09/04/23 1222  BP:  (!) 178/71  Resp:    Temp: (!) 36.4 C   SpO2:  95%    Last Pain:  Vitals:   09/04/23 1222  TempSrc:   PainSc: 0-No pain                 Cuong Moorman C Ronica Vivian

## 2023-10-01 ENCOUNTER — Other Ambulatory Visit: Payer: Self-pay

## 2023-10-01 ENCOUNTER — Emergency Department
Admission: EM | Admit: 2023-10-01 | Discharge: 2023-10-02 | Disposition: A | Discharge status: Home / Self Care | Payer: Medicare HMO | Attending: Emergency Medicine | Admitting: Emergency Medicine

## 2023-10-01 DIAGNOSIS — F430 Acute stress reaction: Secondary | ICD-10-CM | POA: Diagnosis not present

## 2023-10-01 DIAGNOSIS — J449 Chronic obstructive pulmonary disease, unspecified: Secondary | ICD-10-CM | POA: Insufficient documentation

## 2023-10-01 DIAGNOSIS — F17210 Nicotine dependence, cigarettes, uncomplicated: Secondary | ICD-10-CM | POA: Diagnosis not present

## 2023-10-01 DIAGNOSIS — Z79899 Other long term (current) drug therapy: Secondary | ICD-10-CM | POA: Insufficient documentation

## 2023-10-01 DIAGNOSIS — I10 Essential (primary) hypertension: Secondary | ICD-10-CM | POA: Diagnosis not present

## 2023-10-01 DIAGNOSIS — F4321 Adjustment disorder with depressed mood: Secondary | ICD-10-CM | POA: Diagnosis not present

## 2023-10-01 DIAGNOSIS — R404 Transient alteration of awareness: Secondary | ICD-10-CM | POA: Diagnosis present

## 2023-10-01 DIAGNOSIS — Z85828 Personal history of other malignant neoplasm of skin: Secondary | ICD-10-CM | POA: Insufficient documentation

## 2023-10-01 DIAGNOSIS — J45909 Unspecified asthma, uncomplicated: Secondary | ICD-10-CM | POA: Insufficient documentation

## 2023-10-01 LAB — CBC
HCT: 44.1 % (ref 36.0–46.0)
Hemoglobin: 14.9 g/dL (ref 12.0–15.0)
MCH: 30.3 pg (ref 26.0–34.0)
MCHC: 33.8 g/dL (ref 30.0–36.0)
MCV: 89.8 fL (ref 80.0–100.0)
Platelets: 201 10*3/uL (ref 150–400)
RBC: 4.91 MIL/uL (ref 3.87–5.11)
RDW: 12.4 % (ref 11.5–15.5)
WBC: 5.9 10*3/uL (ref 4.0–10.5)
nRBC: 0 % (ref 0.0–0.2)

## 2023-10-01 LAB — URINE DRUG SCREEN, QUALITATIVE (ARMC ONLY)
Amphetamines, Ur Screen: NOT DETECTED
Barbiturates, Ur Screen: NOT DETECTED
Benzodiazepine, Ur Scrn: NOT DETECTED
Cannabinoid 50 Ng, Ur ~~LOC~~: NOT DETECTED
Cocaine Metabolite,Ur ~~LOC~~: NOT DETECTED
MDMA (Ecstasy)Ur Screen: NOT DETECTED
Methadone Scn, Ur: NOT DETECTED
Opiate, Ur Screen: NOT DETECTED
Phencyclidine (PCP) Ur S: NOT DETECTED
Tricyclic, Ur Screen: NOT DETECTED

## 2023-10-01 LAB — COMPREHENSIVE METABOLIC PANEL
ALT: 22 U/L (ref 0–44)
AST: 20 U/L (ref 15–41)
Albumin: 3.8 g/dL (ref 3.5–5.0)
Alkaline Phosphatase: 64 U/L (ref 38–126)
Anion gap: 8 (ref 5–15)
BUN: 14 mg/dL (ref 8–23)
CO2: 28 mmol/L (ref 22–32)
Calcium: 9.8 mg/dL (ref 8.9–10.3)
Chloride: 94 mmol/L — ABNORMAL LOW (ref 98–111)
Creatinine, Ser: 0.49 mg/dL (ref 0.44–1.00)
GFR, Estimated: >60 mL/min (ref >60)
Glucose, Bld: 94 mg/dL (ref 70–99)
Potassium: 4.5 mmol/L (ref 3.5–5.1)
Sodium: 130 mmol/L — ABNORMAL LOW (ref 135–145)
Total Bilirubin: 0.5 mg/dL (ref 0.0–1.2)
Total Protein: 6.7 g/dL (ref 6.5–8.1)

## 2023-10-01 LAB — ACETAMINOPHEN LEVEL: Acetaminophen (Tylenol), Serum: <10 ug/mL — ABNORMAL LOW (ref 10–30)

## 2023-10-01 LAB — ETHANOL: Alcohol, Ethyl (B): <10 mg/dL (ref <10)

## 2023-10-01 LAB — SALICYLATE LEVEL: Salicylate Lvl: <7 mg/dL — ABNORMAL LOW (ref 7.0–30.0)

## 2023-10-01 MED ORDER — CLONIDINE HCL 0.1 MG PO TABS
0.2000 mg | ORAL_TABLET | Freq: Once | ORAL | Status: AC
  Administered 2023-10-01: 0.2 mg via ORAL
  Filled 2023-10-01: qty 2

## 2023-10-01 NOTE — Discharge Instructions (Addendum)
Please seek medical attention and help for any thoughts about wanting to harm yourself, harm others, any concerning change in behavior, severe depression, inappropriate drug use or any other new or concerning symptoms. ° °

## 2023-10-01 NOTE — ED Triage Notes (Signed)
 Pt to ED [facility] for possible drug OD on Klonopin . From home. Husband died within past 24 hours. Pt states her heart was hurting and was having trouble sleeping. States took pills to help her rest. Reports took total of (3) 0.5mg  Klonopin  starting this am. States took one pill 3 separate times through out day.  Ems initially reported pt possible took "a handful". Prescription filled 08/27/21. Pt denies SI/HI Pt tearful, appears sad. Alert and oriented.

## 2023-10-01 NOTE — BH Assessment (Signed)
 Comprehensive Clinical Assessment (CCA) Note  10/01/2023 Sylvia Richardson [id number]  Chief Complaint: Patient is a 68 year old female presenting to [facility] ED voluntarily. Per triage note Pt to ED [facility] for possible drug OD on Klonopin . From home. Husband died within past 24 hours. Pt states her heart was hurting and was having trouble sleeping. States took pills to help her rest. Reports took total of (3) 0.5mg  Klonopin  starting this am. States took one pill 3 separate times through out day. Ems initially reported pt possible took "a handful". Prescription filled 08/27/21. Pt denies SI/HI. Pt tearful, appears sad. Alert and oriented. During assessment patient appears alert and oriented x4, calm and cooperative, mood is pleasant. Patient reports "they thought I overdosed but I didn't, my sister called 911 and told them I took too many." "My husband had been in hospice and I had been up with him, I told my sister that I took 3 Klonopin  over the course of the day not all at once, and I was finally sleeping so when she came over to try to wake me up I wasn't getting up because I was tired." Patient reports that her husband just recently passed this morning around 1am, she reports "I was upset, it broke my heart and I got sick that's why I was awake." Patient denies this being an attempt to hurt herself and she denies any prior attempts. Patient denies SI/HI/AH/VH.  Chief Complaint  Patient presents with   Drug Overdose   Visit Diagnosis: Grief    CCA Screening, Triage and Referral (STR)  Patient Reported Information How did you hear about us ? Other (Comment)  Referral name: No data recorded Referral phone number: No data recorded  Whom do you see for routine medical problems? No data recorded Practice/Facility Name: No data recorded Practice/Facility Phone Number: No data recorded Name of Contact: No data recorded Contact Number: No data recorded Contact Fax Number: No data  recorded Prescriber Name: No data recorded Prescriber Address (if known): No data recorded  What Is the Reason for Your Visit/Call Today? Pt to ED [facility] for possible drug OD on Klonopin . From home. Husband died within past 24 hours. Pt states her heart was hurting and was having trouble sleeping. States took pills to help her rest. Reports took total of (3) 0.5mg  Klonopin  starting this am. States took one pill 3 separate times through out day.   Ems initially reported pt possible took "a handful". Prescription filled 08/27/21.  Pt denies SI/HI  Pt tearful, appears sad. Alert and oriented  How Long Has This Been Causing You Problems? <Week  What Do You Feel Would Help You the Most Today? No data recorded  Have You Recently Been in Any Inpatient Treatment (Hospital/Detox/Crisis Center/28-Day Program)? No data recorded Name/Location of Program/Hospital:No data recorded How Long Were You There? No data recorded When Were You Discharged? No data recorded  Have You Ever Received Services From [facility] Before? No data recorded Who Do You See at [facility]? No data recorded  Have You Recently Had Any Thoughts About Hurting Yourself? No  Are You Planning to Commit Suicide/Harm Yourself At This time? No   Have you Recently Had Thoughts About Hurting Someone Marigene Shoulder? No  Explanation: No data recorded  Have You Used Any Alcohol or Drugs in the Past 24 Hours? No  How Long Ago Did You Use Drugs or Alcohol? No data recorded What Did You Use and How Much? No data recorded  Do You Currently Have  a Therapist/Psychiatrist? No  Name of Therapist/Psychiatrist: No data recorded  Have You Been Recently Discharged From Any Office Practice or Programs? No  Explanation of Discharge From Practice/Program: No data recorded    CCA Screening Triage Referral Assessment Type of Contact: Face-to-Face  Is this Initial or Reassessment? No data recorded Date Telepsych consult ordered in [facility]:  No data  recorded Time Telepsych consult ordered in [facility]:  No data recorded  Patient Reported Information Reviewed? No data recorded Patient Left Without Being Seen? No data recorded Reason for Not Completing Assessment: No data recorded  Collateral Involvement: No data recorded  Does Patient Have a Court Appointed Legal Guardian? No data recorded Name and Contact of Legal Guardian: No data recorded If Minor and Not Living with Parent(s), Who has Custody? No data recorded Is CPS involved or ever been involved? Never  Is APS involved or ever been involved? Never   Patient Determined To Be At Risk for Harm To Self or Others Based on Review of Patient Reported Information or Presenting Complaint? No  Method: No data recorded Availability of Means: No data recorded Intent: No data recorded Notification Required: No data recorded Additional Information for Danger to Others Potential: No data recorded Additional Comments for Danger to Others Potential: No data recorded Are There Guns or Other Weapons in Your Home? No  Types of Guns/Weapons: No data recorded Are These Weapons Safely Secured?                            No data recorded Who Could Verify You Are Able To Have These Secured: No data recorded Do You Have any Outstanding Charges, Pending Court Dates, Parole/Probation? No data recorded Contacted To Inform of Risk of Harm To Self or Others: No data recorded  Location of Assessment: [facility] ED   Does Patient Present under Involuntary Commitment? No  IVC Papers Initial File Date: No data recorded  [place] of Residence: [place]   Patient Currently Receiving the Following Services: No data recorded  Determination of Need: Emergent (2 hours)   Options For Referral: No data recorded    CCA Biopsychosocial Intake/Chief Complaint:  No data recorded Current Symptoms/Problems: No data recorded  Patient Reported Schizophrenia/Schizoaffective Diagnosis in Past: No   Strengths:  Patient is able to communicate her needs  Preferences: No data recorded Abilities: No data recorded  Type of Services Patient Feels are Needed: No data recorded  Initial Clinical Notes/Concerns: No data recorded  Mental Health Symptoms Depression:  Change in energy/activity; Fatigue   Duration of Depressive symptoms: Less than two weeks   Mania:  None   Anxiety:   None   Psychosis:  None   Duration of Psychotic symptoms: No data recorded  Trauma:  None   Obsessions:  None   Compulsions:  None   Inattention:  None   Hyperactivity/Impulsivity:  None   Oppositional/Defiant Behaviors:  None   Emotional Irregularity:  None   Other Mood/Personality Symptoms:  No data recorded   Mental Status Exam Appearance and self-care  Stature:  Average   Weight:  Thin   Clothing:  Casual   Grooming:  Normal   Cosmetic use:  None   Posture/gait:  Normal   Motor activity:  Not Remarkable   Sensorium  Attention:  Normal   Concentration:  Normal   Orientation:  X5   Recall/memory:  Normal   Affect and Mood  Affect:  Appropriate   Mood:  Other (  Comment)   Relating  Eye contact:  Normal   Facial expression:  Responsive   Attitude toward examiner:  Cooperative   Thought and Language  Speech flow: Clear and Coherent   Thought content:  Appropriate to Mood and Circumstances   Preoccupation:  None   Hallucinations:  None   Organization:  No data recorded  [employer] of Knowledge:  Fair   Intelligence:  Average   Abstraction:  Normal   Judgement:  Good   Reality Testing:  Adequate   Insight:  Fair   Decision Making:  Normal   Social Functioning  Social Maturity:  Responsible   Social Judgement:  Normal   Stress  Stressors:  Grief/losses   Coping Ability:  Normal   Skill Deficits:  None   Supports:  Family     Religion: Religion/Spirituality Are You A Religious Person?: No  Leisure/Recreation: Leisure /  Recreation Do You Have Hobbies?: No  Exercise/Diet: Exercise/Diet Do You Exercise?: No Have You Gained or Lost A Significant Amount of Weight in the Past Six Months?: No Do You Follow a Special Diet?: No Do You Have Any Trouble Sleeping?: Yes Explanation of Sleeping Difficulties: Patient reports some difficulty sleeping   CCA Employment/Education Employment/Work Situation: Employment / Work Situation Employment Situation: Retired Has Patient ever Been in Equities trader?: No  Education: Education Is Patient Currently Attending School?: No Did You Have An Individualized Education Program (IIEP): No Did You Have Any Difficulty At [employer]?: No Patient's Education Has Been Impacted by Current Illness: No   CCA Family/Childhood History Family and Relationship History: Family history Marital status: Widowed Widowed, when?: 10/01/2023 Does patient have children?: Yes How many children?: 1 How is patient's relationship with their children?: Patient has a good relationship with her son  Childhood History:  Childhood History Did patient suffer any verbal/emotional/physical/sexual abuse as a child?: No Did patient suffer from severe childhood neglect?: No Has patient ever been sexually abused/assaulted/raped as an adolescent or adult?: No Was the patient ever a victim of a crime or a disaster?: No Witnessed domestic violence?: No Has patient been affected by domestic violence as an adult?: No  Child/Adolescent Assessment:     CCA Substance Use Alcohol/Drug Use: Alcohol / Drug Use Pain Medications: see mar Prescriptions: see mar Over the Counter: see mar History of alcohol / drug use?: No history of alcohol / drug abuse                         ASAM's:  Six Dimensions of Multidimensional Assessment  Dimension 1:  Acute Intoxication and/or Withdrawal Potential:      Dimension 2:  Biomedical Conditions and Complications:      Dimension 3:  Emotional, Behavioral,  or Cognitive Conditions and Complications:     Dimension 4:  Readiness to Change:     Dimension 5:  Relapse, Continued use, or Continued Problem Potential:     Dimension 6:  Recovery/Living Environment:     ASAM Severity Score:    ASAM Recommended Level of Treatment:     Substance use Disorder (SUD)    Recommendations for Services/Supports/Treatments:    DSM5 Diagnoses: Patient Active Problem List   Diagnosis Date Noted   Subclavian arterial stenosis (HCC) 01/31/2023   Rectal bleeding 03/12/2022   Lower GI bleed    COPD with acute exacerbation (HCC) 08/24/2021   Hypoxia 08/24/2021   Acute urinary retention 08/24/2021   Constipation 08/24/2021   Bipolar disorder (HCC)  Hx of nonmelanoma skin cancer 03/13/2021   Anxiety, generalized 12/13/2020   Recurrent cold sores 11/02/2020   Age-related osteoporosis without current pathological fracture 03/19/2019   PAD (peripheral artery disease) (HCC) 04/22/2018   Lichen sclerosus 08/04/2017   Bilateral carotid artery stenosis 02/14/2017   Calculus of kidney 01/23/2016   History of TIAs 06/20/2015   Social phobia 02/16/2015   Headache disorder 12/15/2014   Renal infarct (HCC) 11/18/2014   SUI (stress urinary incontinence, female) 11/18/2014   Arthropathy 11/17/2014   Gastro-esophageal reflux disease without esophagitis 11/17/2014   Malignant neoplasm (HCC) 11/17/2014   Candidiasis of perineum 03/23/2014   Incomplete emptying of bladder 03/23/2014   Pure hyperglyceridemia 12/02/2013   Hyponatremia 12/02/2013   Hyposmolality and/or hyponatremia 12/02/2013   Bladder pain 11/18/2013   Urge incontinence 11/18/2013   Microscopic hematuria 06/30/2013   Urinary incontinence without sensory awareness 06/30/2013   Anxiety state 02/20/2013   Chronic pain 02/20/2013   Localized osteoarthrosis, lower leg 02/20/2013   Low back pain 02/20/2013   Other depressive disorder 02/20/2013   Pain in joint involving lower leg 02/20/2013    Hypertension 06/15/2010   Atopic dermatitis and related condition 03/21/2010   COPD (chronic obstructive pulmonary disease) (HCC) 03/21/2010   Nicotine  dependence, unspecified, uncomplicated 03/21/2010   Malaise and fatigue 01/03/2010   Mixed emotional features as adjustment reaction 01/03/2010   Impaired fasting glucose 11/28/2009   Obstructive chronic bronchitis (HCC) 09/07/2009   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   External hemorrhoids 06/13/2009   Chronic allergic conjunctivitis 06/12/2009   Gastro-esophageal reflux disease with esophagitis 06/12/2009   Hypertonicity of bladder 06/12/2009   Otalgia 06/12/2009    Patient Centered Plan: Patient is on the following Treatment Plan(s):  Anxiety   Referrals to Alternative Service(s): Referred to Alternative Service(s):   Place:   Date:   Time:    Referred to Alternative Service(s):   Place:   Date:   Time:    Referred to Alternative Service(s):   Place:   Date:   Time:    Referred to Alternative Service(s):   Place:   Date:   Time:      @BHCOLLABOFCARE @  [employer], LCAS-A

## 2023-10-01 NOTE — ED Notes (Signed)
 NP Hunt on computer with pt in her room.

## 2023-10-01 NOTE — ED Notes (Signed)
 Family in with 10 minute visit with pt.  PR Louann at door side during visit.

## 2023-10-01 NOTE — Consult Note (Addendum)
[facility] Consult Note  Patient Name: Taje Tondreau MRN: [id number] DOB: 1956/06/03 DATE OF Consult: 10/01/2023  PRIMARY PSYCHIATRIC DIAGNOSES  1.  Grief 2.  Acute Stress Reaction   RECOMMENDATIONS  Recommendations: Medication recommendations:  Continue home medication regimen, as written by psychiatrist: -- Klonopin 1mg  po BID PRN for anxiety -- Cymbalta 120mg  po daily for depression/ anxiety -- Trazodone 100-200mg  po at bedtime PRN for insomnia   Non-Medication/therapeutic recommendations:  -- Patient to attend upcoming, outpatient psychiatrist appointment on 10/03/2023 -- Patient agreeable to receive outpatient therapy referral/ resources along with grief counseling -- Encouraged to continue taking psychotropic medications as written by her psychiatrist  Is inpatient psychiatric hospitalization recommended for this patient?  -- No patient does not meet criteria for inpatient psychiatric hospitalization at this time.   Communication: Treatment team members (and family members if applicable) who were involved in treatment/care discussions and planning, and with whom we spoke or engaged with via secure text/chat, include the following: Dr. Derrill Kay and ED primary team   Thank you for involving [place] in the care of this patient. If you have any additional questions or concerns, please call [phone] and ask for me or the provider on-call.  TELEPSYCHIATRY ATTESTATION & CONSENT  As the provider for this telehealth consult, I attest that I verified the patient's identity using two separate identifiers, introduced myself to the patient, provided my credentials, disclosed my location, and performed this encounter via a HIPAA-compliant, real-time, face-to-face, two-way, interactive audio and video platform and with the full consent and agreement of the patient (or guardian as applicable.)  Patient physical location: ED in [facility]. Telehealth provider  physical location: home office in state of [place]  Video start time: [address] [facility] Time) Video end time: 2209 [facility] Time)   IDENTIFYING DATA  Jhania Etherington is a 68 y.o. year-old female for whom a psychiatric consultation has been ordered by the primary provider. The patient was identified using two separate identifiers.  CHIEF COMPLAINT/REASON FOR CONSULT  Possible Drug Overdose   HISTORY OF PRESENT ILLNESS (HPI)  The patient is a 68yo female who presented to the emergency department after her sister found her less responsive than normal. Patient's husband reportedly passed away within the last 24 hours. Patient had told her sister that she took #3 Klonopin 0.5mg  pills over the course of the day to help her sleep. When sister found her less responsive she grew concerned for possible drug overdose. Patient has told ED staff that this was not a suicide attempt and has denied SI/HI,AVH throughout her visit. UDS is also negative for benzodiazepines and all other substances. BAL negative. The primary team is asking psychiatry to assist with evaluation of possible drug overdose.   Patient evaluated this evening unaccompanied. She is calm and cooperative. Alert and oriented x4. Pleasant. No agitation observed. No erratic or disorganized behavior observed. Good eye contact. Patient shares that her husband passed away this morning around 0100-0200. He had been ill for quite some time, on hospice at home. Patient had been main caregiver since initiation of hospice care. Patient states his body was picked up from the home around 0400. Patient states at this time her "nerves got real bad" she became "hysterical" and began having a "nervous breakdown". Patient states she is prescribed Klonopin 1mg  BID PRN by her psychiatrist, Dr. Fannie Knee, at [facility] in [place], [place]. Around 0400 today patient took 1mg  of Klonopin. States after she took the medication she "kept getting  more  upset, I tried to lay down but I couldn't fall asleep". Patient states she called a friend for support and then took another 1mg  tablet of Klonopin around 0800. Patient states she made more calls to family, friends, and social security. At this point she states her "nerves were shot" and couldn't relax. She took 0.5mg , half tablet, of Klonopin between 1100-1200.   Patient states around 1500 this afternoon, her sister went to the store for her. Patient wanted to sleep and get some rest but still couldn't settle down. She states she hadn't slept in many nights due to caring for her husband at all hours of the night. She took another Klonopin 0.5mg , half tablet, between 1500-1600. Patient states she finally fell asleep at this point. Her sister came over to the house to bring over her groceries and sister couldn't wake her up because "I was so tired". Sister then called 911. Patient denies this was a suicide attempt. She denies intent to harm herself or end her life. She denies past suicide attempts/ suicide gestures. No history of self-injurious behaviors. No active nor passive suicidal and homicidal ideations.   In total, patient took 3mg  of Klonopin today over the course of 12 hours. She understands that she is normally only prescribed 2mg  daily but states "I didn' know what to do, I just need to rest". Patient denies hallucinations, paranoia. No delusions apparent otherwise. She does not appear to be responding to internal stimuli, no thought blocking noted. Patient states she will now live alone but sister will be staying with her. Denies access to weapons/ guns in the home.   UDS negative BAL negative   PAST PSYCHIATRIC HISTORY  History of Depression, Anxiety No prior psychiatric hospitalizations Attends outpatient care at [facility], Dr. Fannie Knee, and has upcoming appointment on 10/03/2023.  Otherwise as per HPI above.  PAST MEDICAL HISTORY  Past Medical History:  Diagnosis Date    Anxiety    Asthma    Asthma-COPD overlap syndrome (HCC)    Atherosclerosis of abdominal aorta (HCC)    Bipolar disorder (HCC)    Cancer (HCC)    skin   Carotid artery disease (HCC)    Chronic cystitis    COPD (chronic obstructive pulmonary disease) (HCC)    Depression    Dysphagia    Epigastric pain    GERD (gastroesophageal reflux disease)    H/O degenerative disc disease    Headache    Hypercholesteremia    Hypertension    Lichen sclerosus    Severe tobacco use disorder    Social phobia    Stroke [facility])    TIA several yrs ago.  no deficits   TIA (transient ischemic attack)    Wears hearing aid in both ears    Has.  does not wear.     HOME MEDICATIONS  PTA Medications  Medication Sig   atenolol (TENORMIN) 100 MG tablet Take 100 mg by mouth daily.   DULoxetine (CYMBALTA) 60 MG capsule Take 120 mg by mouth daily.    traZODone (DESYREL) 100 MG tablet Take 100-200 mg by mouth at bedtime.   metoCLOPramide (REGLAN) 10 MG tablet Take 10 mg by mouth in the morning, at noon, in the evening, and at bedtime.   aspirin EC 81 MG tablet Take 81 mg by mouth daily.    clonazePAM (KLONOPIN) 0.5 MG tablet Take 1 mg by mouth 4 (four) times daily as needed for anxiety.   PROVENTIL HFA 108 (90 Base) MCG/ACT  inhaler INHALE 2 PUFFS INTO THE LUNGS EVERY 4 HOURS AS NEEDED FOR WHEEZING OR SHORTNESS OF BREATH   DALIRESP 500 MCG TABS tablet TAKE 1 TABLET BY MOUTH EVERY DAY(PLEASE KEEP APPT. IN JULY)   omeprazole (PRILOSEC) 40 MG capsule Take 40 mg by mouth every evening.   esomeprazole (NEXIUM) 40 MG capsule Take 40 mg by mouth daily after breakfast.   ibuprofen (ADVIL) 200 MG tablet Take 200 mg by mouth every 6 (six) hours as needed.   predniSONE (DELTASONE) 10 MG tablet Take 10 mg by mouth daily as needed.     ALLERGIES  Allergies  Allergen Reactions   Bactrim [Sulfamethoxazole-Trimethoprim] Diarrhea   Ciprofloxacin Anaphylaxis   Clindamycin/Lincomycin Nausea And Vomiting   Zithromax  [Azithromycin]     Abdomen pain and acid reflux    Choline Fenofibrate     Other reaction(s): Other (See Comments) Other Reaction: LEFT SIDE TONGUE SWELLING Other reaction(s): Other (See Comments) LEFT SIDE TONGUE SWELLING Other Reaction: LEFT SIDE TONGUE SWELLING   Doxycycline Nausea Only   Fish Oil Nausea Only   Levaquin [Levofloxacin] Rash   Niacin Diarrhea   Nifedipine Diarrhea   Rosuvastatin     Other reaction(s): Headache    SOCIAL & SUBSTANCE USE HISTORY  Social History   Socioeconomic History   Marital status: Divorced    Spouse name: Not on file   Number of children: Not on file   Years of education: Not on file   Highest education level: Not on file  Occupational History   Not on file  Tobacco Use   Smoking status: Every Day    Current packs/day: 1.00    Average packs/day: 2.0 packs/day for 47.0 years (93.5 ttl pk-yrs)    Types: Cigarettes    Start date: 88   Smokeless tobacco: Never   Tobacco comments:    10 cigarettes a day   Vaping Use   Vaping status: Never Used  Substance and Sexual Activity   Alcohol use: No   Drug use: No   Sexual activity: Not on file  Other Topics Concern   Not on file  Social History Narrative   Not on file   Social Drivers of Health   Financial Resource Strain: Low Risk  (08/25/2023)   Received from [facility]   Overall Financial Resource Strain (CARDIA)    Difficulty of Paying Living Expenses: Not very hard  Food Insecurity: No Food Insecurity (08/25/2023)   Received from [facility]   Hunger Vital Sign    Worried About Running Out of Food in the Last Year: Never true    Ran Out of Food in the Last Year: Never true  Transportation Needs: Unmet Transportation Needs (08/25/2023)   Received from [facility] - Transportation    In the past 12 months, has lack of transportation kept you from medical appointments or from getting medications?: Yes    Lack of  Transportation (Non-Medical): Yes  Physical Activity: Inactive (08/30/2021)   Received from [facility], [facility]   Exercise Vital Sign    Days of Exercise per Week: 0 days    Minutes of Exercise per Session: 0 min  Stress: Stress Concern Present (08/30/2021)   Received from [facility], [facility]   [employer] of Occupational Health - Occupational Stress Questionnaire    Feeling of Stress : Rather much  Social Connections: Socially Isolated (08/30/2021)   Received  from [facility], [facility]   Social Connection and Isolation Panel [NHANES]    Frequency of Communication with Friends and Family: More than three times a week    Frequency of Social Gatherings with Friends and Family: More than three times a week    Attends Religious Services: Never    Database administrator or Organizations: No    Attends Engineer, structural: Never    Marital Status: Divorced   Social History   Tobacco Use  Smoking Status Every Day   Current packs/day: 1.00   Average packs/day: 2.0 packs/day for 47.0 years (93.5 ttl pk-yrs)   Types: Cigarettes   Start date: 1978  Smokeless Tobacco Never  Tobacco Comments   10 cigarettes a day    Social History   Substance and Sexual Activity  Alcohol Use No   Social History   Substance and Sexual Activity  Drug Use No    Additional pertinent information: lives alone  FAMILY HISTORY  Family History  Problem Relation Age of Onset   Prostate cancer Father    Stroke Maternal Grandfather    Stroke Paternal Grandfather    Breast cancer Neg Hx    Family Psychiatric History (if known):  None   MENTAL STATUS EXAM (MSE)  Mental Status Exam: General Appearance: Fairly Groomed  Orientation:  Full (Time, Place, and Person)  Memory:  Recent;   Good  Concentration:  Concentration: Good  Recall:  Good  Attention  Good  Eye  Contact:  Good  Speech:  Clear and Coherent  Language:  Good  Volume:  Decreased  Mood: Sad, Grieving   Affect:  Congruent  Thought Process:  Linear  Thought Content:  Logical  Suicidal Thoughts:  No  Homicidal Thoughts:  No  Judgement:  Fair  Insight:  Fair  Psychomotor Activity:  Normal  Akathisia:  No  Fund of Knowledge:  Good    Assets:  Communication Skills Housing Physical Health Social Support  Cognition:  WNL  ADL's:  Intact  AIMS (if indicated):       VITALS  Blood pressure (!) 192/82, pulse 72, temperature 98 F (36.7 C), temperature source Oral, resp. rate 18, height 5\' 4"  (1.626 m), weight 61.2 kg, SpO2 93%.  LABS  Admission on 10/01/2023  Component Date Value Ref Range Status   Sodium 10/01/2023 130 (L)  135 - 145 mmol/L Final   Potassium 10/01/2023 4.5  3.5 - 5.1 mmol/L Final   Chloride 10/01/2023 94 (L)  98 - 111 mmol/L Final   CO2 10/01/2023 28  22 - 32 mmol/L Final   Glucose, Bld 10/01/2023 94  70 - 99 mg/dL Final   Glucose reference range applies only to samples taken after fasting for at least 8 hours.   BUN 10/01/2023 14  8 - 23 mg/dL Final   Creatinine, Ser 10/01/2023 0.49  0.44 - 1.00 mg/dL Final   Calcium 16/06/9603 9.8  8.9 - 10.3 mg/dL Final   Total Protein 54/05/8118 6.7  6.5 - 8.1 g/dL Final   Albumin 14/78/2956 3.8  3.5 - 5.0 g/dL Final   AST 21/30/8657 20  15 - 41 U/L Final   ALT 10/01/2023 22  0 - 44 U/L Final   Alkaline Phosphatase 10/01/2023 64  38 - 126 U/L Final   Total Bilirubin 10/01/2023 0.5  0.0 - 1.2 mg/dL Final   GFR, Estimated 10/01/2023 >60  >60 mL/min Final   Comment: (NOTE) Calculated using the CKD-EPI Creatinine  Equation (2021)    Anion gap 10/01/2023 8  5 - 15 Final   Performed at [facility], [address] [place], [place] [address]   Alcohol, Ethyl (B) 10/01/2023 <10  <10 mg/dL Final   Comment: (NOTE) Lowest detectable limit for serum alcohol is 10 mg/dL.  For medical purposes only. Performed at  [facility], [address] [place], [place] [address]    Salicylate Lvl 10/01/2023 <7.0 (L)  7.0 - 30.0 mg/dL Final   Performed at [facility], [address] [place], [place] [address]   Acetaminophen (Tylenol), Serum 10/01/2023 <10 (L)  10 - 30 ug/mL Final   Comment: (NOTE) Therapeutic concentrations vary significantly. A range of 10-30 ug/mL  may be an effective concentration for many patients. However, some  are best treated at concentrations outside of this range. Acetaminophen concentrations >150 ug/mL at 4 hours after ingestion  and >50 ug/mL at 12 hours after ingestion are often associated with  toxic reactions.  Performed at [facility], [address] [place], [place] [address]    WBC 10/01/2023 5.9  4.0 - 10.5 K/uL Final   RBC 10/01/2023 4.91  3.87 - 5.11 MIL/uL Final   Hemoglobin 10/01/2023 14.9  12.0 - 15.0 g/dL Final   HCT 65/78/4696 44.1  36.0 - 46.0 % Final   MCV 10/01/2023 89.8  80.0 - 100.0 fL Final   MCH 10/01/2023 30.3  26.0 - 34.0 pg Final   MCHC 10/01/2023 33.8  30.0 - 36.0 g/dL Final   RDW 29/52/8413 12.4  11.5 - 15.5 % Final   Platelets 10/01/2023 201  150 - 400 K/uL Final   nRBC 10/01/2023 0.0  0.0 - 0.2 % Final   Performed at [facility], [address] [place], [place] [address]   Tricyclic, Ur Screen 10/01/2023 NONE DETECTED  NONE DETECTED Final   Amphetamines, Ur Screen 10/01/2023 NONE DETECTED  NONE DETECTED Final   MDMA (Ecstasy)Ur Screen 10/01/2023 NONE DETECTED  NONE DETECTED Final   Cocaine Metabolite,Ur [place] 10/01/2023 NONE DETECTED  NONE DETECTED Final   Opiate, Ur Screen 10/01/2023 NONE DETECTED  NONE DETECTED Final   Phencyclidine (PCP) Ur S 10/01/2023 NONE DETECTED  NONE DETECTED Final   Cannabinoid 50 Ng, Ur [place] 10/01/2023 NONE DETECTED  NONE DETECTED Final   Barbiturates, Ur Screen 10/01/2023 NONE DETECTED  NONE DETECTED Final   Benzodiazepine, Ur Scrn 10/01/2023 NONE DETECTED  NONE DETECTED  Final   Methadone Scn, Ur 10/01/2023 NONE DETECTED  NONE DETECTED Final   Comment: (NOTE) Tricyclics + metabolites, urine    Cutoff 1000 ng/mL Amphetamines + metabolites, urine  Cutoff 1000 ng/mL MDMA (Ecstasy), urine              Cutoff 500 ng/mL Cocaine Metabolite, urine          Cutoff 300 ng/mL Opiate + metabolites, urine        Cutoff 300 ng/mL Phencyclidine (PCP), urine         Cutoff 25 ng/mL Cannabinoid, urine                 Cutoff 50 ng/mL Barbiturates + metabolites, urine  Cutoff 200 ng/mL Benzodiazepine, urine              Cutoff 200 ng/mL Methadone, urine                   Cutoff 300 ng/mL  The urine drug screen provides only a preliminary, unconfirmed analytical test result  and should not be used for non-medical purposes. Clinical consideration and professional judgment should be applied to any positive drug screen result due to possible interfering substances. A more specific alternate chemical method must be used in order to obtain a confirmed analytical result. Gas chromatography / mass spectrometry (GC/MS) is the preferred confirm                          atory method. Performed at [facility], [address] [place], [place] [address]     PSYCHIATRIC REVIEW OF SYSTEMS (ROS)  ROS: Notable for the following relevant positive findings: Review of Systems  Psychiatric/Behavioral:  Positive for depression. Negative for hallucinations, substance abuse and suicidal ideas. The patient is nervous/anxious and has insomnia.     Additional findings:      Musculoskeletal: No abnormal movements observed      Gait & Station: Laying/Sitting      Pain Screening: Denies      Nutrition & Dental Concerns: No concerns at this time   RISK FORMULATION/ASSESSMENT  Is the patient experiencing any suicidal or homicidal ideations: No       Explain if yes:  Protective factors considered for safety management: access to care, established outpatient psychiatric care, housing,  family/ social support, no access to weapons  Risk factors/concerns considered for safety management: widowed  Depression Recent loss Age over 54  Is there a safety management plan with the patient and treatment team to minimize risk factors and promote protective factors: Yes           Explain: Patient currently in the ED, medication management, comfort measures. Patient with upcoming outpatient appointment 1/17. Plan to return home and attend outpatient care.  Is crisis care placement or psychiatric hospitalization recommended: No     Based on my current evaluation and risk assessment, patient is determined at this time to be at:  Low risk  *RISK ASSESSMENT Risk assessment is a dynamic process; it is possible that this patient's condition, and risk level, may change. This should be re-evaluated and managed over time as appropriate. Please re-consult psychiatric consult services if additional assistance is needed in terms of risk assessment and management. If your team decides to discharge this patient, please advise the patient how to best access emergency psychiatric services, or to call 911, if their condition worsens or they feel unsafe in any way.   Assunta Gambles, NP Telepsychiatry Consult Services

## 2023-10-01 NOTE — ED Notes (Signed)
 Poison control called.  Md aware.

## 2023-10-01 NOTE — ED Notes (Signed)
Md in with pt.   

## 2023-10-01 NOTE — ED Notes (Signed)
 Pt brought in via ems from home.  Pt reports she took clonazepam  0.5 mg x3 today  pt states her husband passed away today.  Pt denies SI or HI  pt denies etoh use.  Pt states I'm just sad.  Nsr on monitor.  Pt ambulated to bathroom with assistance.  Iv in place.

## 2023-10-01 NOTE — ED Provider Notes (Signed)
 [facility] Provider Note    Event Date/Time   First MD Initiated Contact with Patient 10/01/23 1815     (approximate)   History   Drug Overdose   HPI  Sylvia Richardson is a 68 y.o. female who presented to the emergency department today because of concerns for decreased responsiveness and concern for possible overdose.  The patient states that she lost her husband in the past 24 hours.  He had been in hospice.  She states she did take 3 Klonopin  today.  Her sister was someone who called EMS after she found patient to be less responsive than normal.  The patient denies any thoughts of self-harm.     Physical Exam   Triage Vital Signs: ED Triage Vitals  Encounter Vitals Group     BP 10/01/23 1820 (!) 193/80     Systolic BP Percentile --      Diastolic BP Percentile --      Pulse Rate 10/01/23 1820 79     Resp 10/01/23 1820 17     Temp 10/01/23 1820 97.9 F (36.6 C)     Temp Source 10/01/23 1934 Oral     SpO2 10/01/23 1820 95 %     Weight 10/01/23 1818 135 lb (61.2 kg)     Height 10/01/23 1818 5\' 4"  (1.626 m)     Head Circumference --      Peak Flow --      Pain Score 10/01/23 1818 0     Pain Loc --      Pain Education --      Exclude from Growth Chart --     Most recent vital signs: Vitals:   10/01/23 1925 10/01/23 1934  BP: (!) 203/84 (!) 203/84  Pulse: 79 76  Resp: 14 17  Temp:  98 F (36.7 C)  SpO2: 94% 92%   General: Awake, alert, oriented. CV:  Good peripheral perfusion.  Resp:  Normal effort.  Abd:  No distention.  Other:  Tearful.   ED Results / Procedures / Treatments   Labs (all labs ordered are listed, but only abnormal results are displayed) Labs Reviewed  COMPREHENSIVE METABOLIC PANEL - Abnormal; Notable for the following components:      Result Value   Sodium 130 (*)    Chloride 94 (*)    All other components within normal limits  SALICYLATE LEVEL - Abnormal; Notable for the following components:    Salicylate Lvl <7.0 (*)    All other components within normal limits  ACETAMINOPHEN  LEVEL - Abnormal; Notable for the following components:   Acetaminophen  (Tylenol ), Serum <10 (*)    All other components within normal limits  ETHANOL  CBC  URINE DRUG SCREEN, QUALITATIVE ([facility] ONLY)     EKG  I, Marylynn Soho, attending physician, personally viewed and interpreted this EKG  EKG Time: 1818 Rate: 77 Rhythm: sinus rhythm Axis: right axis deviation Intervals: qtc 446 QRS: narrow, q waves v1 ST changes: no st elevation Impression: abnormal ekg   RADIOLOGY None  PROCEDURES:  Critical Care performed: No   MEDICATIONS ORDERED IN ED: Medications - No data to display   IMPRESSION / MDM / ASSESSMENT AND PLAN / ED COURSE  I reviewed the triage vital signs and the nursing notes.                              Differential diagnosis includes, but is not limited  to, grief, depression, anxiety  Patient's presentation is most consistent with acute presentation with potential threat to life or bodily function.   Patient presented to the emergency department today because of concerns for decreased responsiveness and possible overdose.  Patient denies any intentional self-harm or overdose. However given concern will have psychiatry evaluate.  The patient has been placed in psychiatric observation due to the need to provide a safe environment for the patient while obtaining psychiatric consultation and evaluation, as well as ongoing medical and medication management to treat the patient's condition.  The patient has not been placed under full IVC at this time.      FINAL CLINICAL IMPRESSION(S) / ED DIAGNOSES   Final diagnoses:  Grief     Note:  This document was prepared using Dragon voice recognition software and may include unintentional dictation errors.    Marylynn Soho, MD 10/01/23 [phone]

## 2023-10-21 ENCOUNTER — Encounter (INDEPENDENT_AMBULATORY_CARE_PROVIDER_SITE_OTHER): Payer: Medicare HMO

## 2023-10-21 ENCOUNTER — Ambulatory Visit (INDEPENDENT_AMBULATORY_CARE_PROVIDER_SITE_OTHER): Payer: Medicare HMO | Admitting: Vascular Surgery

## 2023-10-21 ENCOUNTER — Encounter (INDEPENDENT_AMBULATORY_CARE_PROVIDER_SITE_OTHER): Payer: Self-pay

## 2023-11-13 ENCOUNTER — Ambulatory Visit: Admit: 2023-11-13 | Payer: Medicare HMO | Admitting: Ophthalmology

## 2023-11-13 SURGERY — PHACOEMULSIFICATION, CATARACT, WITH IOL INSERTION
Anesthesia: Topical | Laterality: Right

## 2023-12-30 ENCOUNTER — Other Ambulatory Visit: Payer: Self-pay | Admitting: Pediatrics

## 2023-12-30 DIAGNOSIS — Z1231 Encounter for screening mammogram for malignant neoplasm of breast: Secondary | ICD-10-CM

## 2024-01-02 ENCOUNTER — Ambulatory Visit
Admission: RE | Admit: 2024-01-02 | Discharge: 2024-01-02 | Disposition: A | Discharge status: Home / Self Care | Source: Ambulatory Visit | Attending: Pediatrics | Admitting: Pediatrics

## 2024-01-02 DIAGNOSIS — Z1231 Encounter for screening mammogram for malignant neoplasm of breast: Secondary | ICD-10-CM | POA: Insufficient documentation

## 2024-01-29 ENCOUNTER — Other Ambulatory Visit: Payer: Self-pay | Admitting: Pulmonary Disease

## 2024-01-29 DIAGNOSIS — F17210 Nicotine dependence, cigarettes, uncomplicated: Secondary | ICD-10-CM

## 2024-01-29 DIAGNOSIS — F17200 Nicotine dependence, unspecified, uncomplicated: Secondary | ICD-10-CM

## 2024-02-02 ENCOUNTER — Ambulatory Visit
Admission: RE | Admit: 2024-02-02 | Discharge: 2024-02-02 | Disposition: A | Discharge status: Home / Self Care | Source: Ambulatory Visit | Attending: Pulmonary Disease | Admitting: Pulmonary Disease

## 2024-02-02 DIAGNOSIS — F17200 Nicotine dependence, unspecified, uncomplicated: Secondary | ICD-10-CM | POA: Diagnosis present

## 2024-02-02 DIAGNOSIS — F17210 Nicotine dependence, cigarettes, uncomplicated: Secondary | ICD-10-CM | POA: Insufficient documentation

## 2024-02-03 ENCOUNTER — Encounter (INDEPENDENT_AMBULATORY_CARE_PROVIDER_SITE_OTHER): Payer: Self-pay

## 2024-02-05 ENCOUNTER — Emergency Department
Admission: EM | Admit: 2024-02-05 | Discharge: 2024-02-05 | Disposition: A | Discharge status: Home / Self Care | Attending: Emergency Medicine | Admitting: Emergency Medicine

## 2024-02-05 ENCOUNTER — Other Ambulatory Visit: Payer: Self-pay

## 2024-02-05 ENCOUNTER — Encounter: Payer: Self-pay | Admitting: *Deleted

## 2024-02-05 DIAGNOSIS — I10 Essential (primary) hypertension: Secondary | ICD-10-CM | POA: Diagnosis not present

## 2024-02-05 DIAGNOSIS — R21 Rash and other nonspecific skin eruption: Secondary | ICD-10-CM | POA: Diagnosis present

## 2024-02-05 DIAGNOSIS — J449 Chronic obstructive pulmonary disease, unspecified: Secondary | ICD-10-CM | POA: Insufficient documentation

## 2024-02-05 DIAGNOSIS — B86 Scabies: Secondary | ICD-10-CM | POA: Insufficient documentation

## 2024-02-05 DIAGNOSIS — Z720 Tobacco use: Secondary | ICD-10-CM | POA: Insufficient documentation

## 2024-02-05 MED ORDER — PERMETHRIN 5 % EX CREA: 1.0000 | TOPICAL_CREAM | Freq: Once | CUTANEOUS | 0 refills | Status: AC

## 2024-02-05 MED ORDER — HYDROXYZINE HCL 25 MG PO TABS
25.0000 mg | ORAL_TABLET | Freq: Once | ORAL | Status: AC
  Administered 2024-02-05: 25 mg via ORAL
  Filled 2024-02-05: qty 1

## 2024-02-05 MED ORDER — HYDROXYZINE HCL 10 MG PO TABS: 10.0000 mg | ORAL_TABLET | Freq: Three times a day (TID) | ORAL | 0 refills | Status: AC | PRN

## 2024-02-05 NOTE — ED Provider Notes (Signed)
 [facility] Provider Note    Event Date/Time   First MD Initiated Contact with Patient 02/05/24 2140     (approximate)   History   Rash    HPI  Sylvia Richardson is a 68 y.o. female    with a past medical history of COPD, pulmonary emphysema, tobacco use hypertension left subclavian artery occlusion GERD,who presents to the ED complaining of rash. According to the patient, rash started 3 weeks ago getting worse, associated to pruritus.. Patient lives with her sister. They have a dog an outdoor cat.      Physical Exam   Triage Vital Signs: ED Triage Vitals [02/05/24 2108]  Encounter Vitals Group     BP (!) 238/93     Systolic BP Percentile      Diastolic BP Percentile      Pulse Rate 73     Resp 18     Temp 98.4 F (36.9 C)     Temp Source Oral     SpO2 95 %     Weight 114 lb (51.7 kg)     Height 5\' 4"  (1.626 m)     Head Circumference      Peak Flow      Pain Score 10     Pain Loc      Pain Education      Exclude from Growth Chart     Most recent vital signs: Vitals:   02/05/24 2108  BP: (!) 238/93  Pulse: 73  Resp: 18  Temp: 98.4 F (36.9 C)  SpO2: 95%     Constitutional: Alert, NAD. Able to speak in complete sentences.  Productive coughing, hypertensive in triage Eyes: Conjunctivae are normal.  Head: Atraumatic. Nose: No congestion/rhinnorhea. Mouth/Throat: Mucous membranes are moist.   Neck: Painless ROM. Supple. No JVD, nodes, thyromegaly  Cardiovascular:   Good peripheral circulation.RRR no murmurs, gallops, rubs  Respiratory: Normal respiratory effort.  No retractions. Mobilization of secretions bilaterally Gastrointestinal: Soft and nontender.  Musculoskeletal:  no deformity Neurologic:  MAE spontaneously. No gross focal neurologic deficits are appreciated.  Skin:  Skin is warm, dry and intact. Rash noted in anterior chest, arms, extensor area of the elbows, waist anterior area of tights . Lesions are multiple   erythematous papules, with areas of excoriation Psychiatric: Mood and affect are normal. Speech and behavior are normal.    ED Results / Procedures / Treatments   Labs (all labs ordered are listed, but only abnormal results are displayed) Labs Reviewed - No data to display   EKG     RADIOLOGY    PROCEDURES:  Critical Care performed:   Procedures   MEDICATIONS ORDERED IN ED: Medications - No data to display Clinical Course as of 02/05/24 2221  Thu Feb 05, 2024  2215 Rechecked  blood pressure.  [AE]    Clinical Course User Index [AE] Awilda Lennox, PA-C    IMPRESSION / MDM / ASSESSMENT AND PLAN / ED COURSE  I reviewed the triage vital signs and the nursing notes.  Differential diagnosis includes, but is not limited to, scabies, eczema, allergic reaction  Patient's presentation is most consistent with acute, uncomplicated illness.  Patient's diagnosis is consistent with scabies.  Findings at physical exam are reassuring, finding multiple erythematous papules and excoriation in areas that are covered. I did review the patient's allergies and medications.The patient is in stable and satisfactory condition for discharge home.   Patient will be discharged home with prescriptions for permethrin. Patient  is to follow up with PCP as needed or otherwise directed. Patient is given ED precautions to return to the ED for any worsening or new symptoms. Discussed plan of care with patient, answered all of patient's questions, Patient agreeable to plan of care. Advised patient to take medications according to the instructions on the label. Discussed possible side effects of new medications. Patient verbalized understanding.    FINAL CLINICAL IMPRESSION(S) / ED DIAGNOSES   Final diagnoses:  Scabies     Rx / DC Orders   ED Discharge Orders          Ordered    permethrin (ELIMITE) 5 % cream   Once        02/05/24 2218             Note:  This document was prepared  using Dragon voice recognition software and may include unintentional dictation errors.   Awilda Lennox, PA-C 02/05/24 2223    Twilla Galea, MD 02/05/24 2229

## 2024-02-05 NOTE — Discharge Instructions (Addendum)
 You have been diagnosed with scabies.  Please wash all your clothes, sheets with hot water .  Please do 1 application of the cream all over your body including soles and palms.  Next morning please take a shower to remove all the cream from your body.  It is necessary to repeat application of the cream 2 weeks later.  Please come back to ED or go to your PCP if you have new symptoms or symptoms worsen.

## 2024-02-05 NOTE — ED Triage Notes (Addendum)
 Pt ambulatory to triage with a cane.  Pt has itching rash on chest, arms and back. Pt using otc cream without relief   pt reports taking bp meds today.    pt alert.

## 2024-03-22 ENCOUNTER — Other Ambulatory Visit (INDEPENDENT_AMBULATORY_CARE_PROVIDER_SITE_OTHER): Payer: Self-pay | Admitting: Vascular Surgery

## 2024-03-22 DIAGNOSIS — I6523 Occlusion and stenosis of bilateral carotid arteries: Secondary | ICD-10-CM

## 2024-03-23 ENCOUNTER — Encounter (INDEPENDENT_AMBULATORY_CARE_PROVIDER_SITE_OTHER): Payer: Self-pay | Admitting: Vascular Surgery

## 2024-03-23 ENCOUNTER — Encounter (INDEPENDENT_AMBULATORY_CARE_PROVIDER_SITE_OTHER)

## 2024-03-23 ENCOUNTER — Ambulatory Visit (INDEPENDENT_AMBULATORY_CARE_PROVIDER_SITE_OTHER)

## 2024-03-23 ENCOUNTER — Ambulatory Visit (INDEPENDENT_AMBULATORY_CARE_PROVIDER_SITE_OTHER): Admitting: Nurse Practitioner

## 2024-03-23 ENCOUNTER — Ambulatory Visit (INDEPENDENT_AMBULATORY_CARE_PROVIDER_SITE_OTHER): Admitting: Vascular Surgery

## 2024-03-23 VITALS — BP 166/82 | HR 62 | Resp 16 | Ht 64.0 in | Wt 111.6 lb

## 2024-03-23 DIAGNOSIS — I6523 Occlusion and stenosis of bilateral carotid arteries: Secondary | ICD-10-CM

## 2024-03-23 DIAGNOSIS — I771 Stricture of artery: Secondary | ICD-10-CM | POA: Diagnosis not present

## 2024-03-23 DIAGNOSIS — I10 Essential (primary) hypertension: Secondary | ICD-10-CM | POA: Diagnosis not present

## 2024-03-23 NOTE — Progress Notes (Unsigned)
 MRN : [id number]  Sylvia Richardson is a 68 y.o. (1956-03-26) female who presents with chief complaint of No chief complaint on file. SABRA  History of Present Illness: Patient returns today in follow up of ***  Current Outpatient Medications  Medication Sig Dispense Refill   aspirin EC 81 MG tablet Take 81 mg by mouth daily.      atenolol  (TENORMIN ) 100 MG tablet Take 100 mg by mouth daily.  0   clonazePAM  (KLONOPIN ) 0.5 MG tablet Take 1 mg by mouth 4 (four) times daily as needed for anxiety.     DALIRESP  500 MCG TABS tablet TAKE 1 TABLET BY MOUTH EVERY DAY(PLEASE KEEP APPT. IN JULY) 30 tablet 3   DULoxetine  (CYMBALTA ) 60 MG capsule Take 120 mg by mouth daily.   0   hydrOXYzine  (ATARAX ) 10 MG tablet Take 1 tablet (10 mg total) by mouth 3 (three) times daily as needed. 30 tablet 0   ibuprofen  (ADVIL ) 200 MG tablet Take 200 mg by mouth every 6 (six) hours as needed.     metoCLOPramide (REGLAN) 10 MG tablet Take 10 mg by mouth in the morning, at noon, in the evening, and at bedtime.     omeprazole (PRILOSEC) 40 MG capsule Take 40 mg by mouth every evening.     predniSONE  (DELTASONE ) 10 MG tablet Take 10 mg by mouth daily as needed.     PROVENTIL  HFA 108 (90 Base) MCG/ACT inhaler INHALE 2 PUFFS INTO THE LUNGS EVERY 4 HOURS AS NEEDED FOR WHEEZING OR SHORTNESS OF BREATH 6.7 g 1   traZODone (DESYREL) 100 MG tablet Take 100-200 mg by mouth at bedtime.     No current facility-administered medications for this visit.    Past Medical History:  Diagnosis Date   Anxiety    Asthma    Asthma-COPD overlap syndrome (HCC)    Atherosclerosis of abdominal aorta (HCC)    Bipolar disorder (HCC)    Cancer (HCC)    skin   Carotid artery disease (HCC)    Chronic cystitis    COPD (chronic obstructive pulmonary disease) (HCC)    Depression    Dysphagia    Epigastric pain    GERD (gastroesophageal reflux disease)    H/O degenerative disc disease    Headache    Hypercholesteremia     Hypertension    Lichen sclerosus    Severe tobacco use disorder    Social phobia    Stroke [facility])    TIA several yrs ago.  no deficits   TIA (transient ischemic attack)    Wears hearing aid in both ears    Has.  does not wear.    Past Surgical History:  Procedure Laterality Date   BREAST BIOPSY Left    core bx- neg   CATARACT EXTRACTION W/PHACO Left 09/04/2023   Procedure: CATARACT EXTRACTION PHACO AND INTRAOCULAR LENS PLACEMENT (IOC) LEFT 10.48 01.09.7;  Surgeon: Enola Feliciano Hugger, MD;  Location: [facility];  Service: Ophthalmology;  Laterality: Left;   CESAREAN SECTION     CHOLECYSTECTOMY     COLONOSCOPY WITH PROPOFOL  N/A 03/05/2022   Procedure: COLONOSCOPY WITH PROPOFOL ;  Surgeon: Maryruth Ole DASEN, MD;  Location: [facility];  Service: Endoscopy;  Laterality: N/A;   COLONOSCOPY WITH PROPOFOL  N/A 03/12/2022   Procedure: COLONOSCOPY WITH PROPOFOL ;  Surgeon: Jinny Carmine, MD;  Location: [facility];  Service: Endoscopy;  Laterality: N/A;   COLONOSCOPY WITH PROPOFOL  N/A 11/22/2022   Procedure: COLONOSCOPY WITH PROPOFOL ;  Surgeon: Maryruth Ole  T, MD;  Location: [facility];  Service: Endoscopy;  Laterality: N/A;   ESOPHAGOGASTRODUODENOSCOPY     ESOPHAGOGASTRODUODENOSCOPY (EGD) WITH PROPOFOL  N/A 03/05/2022   Procedure: ESOPHAGOGASTRODUODENOSCOPY (EGD) WITH PROPOFOL ;  Surgeon: Maryruth Ole DASEN, MD;  Location: [facility];  Service: Endoscopy;  Laterality: N/A;   HEMORRHOIDECTOMY WITH HEMORRHOID BANDING     history colonic polyps     Laparoscopic repair paraesophageal hiatal hernia     Nissen Fundoplication     TONSILLECTOMY     TUBAL LIGATION       Social History   Tobacco Use   Smoking status: Every Day    Current packs/day: 1.00    Average packs/day: 2.0 packs/day for 47.5 years (94.0 ttl pk-yrs)    Types: Cigarettes    Start date: 1978   Smokeless tobacco: Never   Tobacco comments:    10 cigarettes a day   Vaping Use   Vaping status:  Never Used  Substance Use Topics   Alcohol use: No   Drug use: No      Family History  Problem Relation Age of Onset   Prostate cancer Father    Stroke Maternal Grandfather    Stroke Paternal Grandfather    Breast cancer Neg Hx      Allergies  Allergen Reactions   Bactrim  [Sulfamethoxazole -Trimethoprim ] Diarrhea   Ciprofloxacin Anaphylaxis   Clindamycin /Lincomycin Nausea And Vomiting   Zithromax  [Azithromycin ]     Abdomen pain and acid reflux    Choline Fenofibrate     Other reaction(s): Other (See Comments) Other Reaction: LEFT SIDE TONGUE SWELLING Other reaction(s): Other (See Comments) LEFT SIDE TONGUE SWELLING Other Reaction: LEFT SIDE TONGUE SWELLING   Doxycycline Nausea Only   Fish Oil Nausea Only   Levaquin  [Levofloxacin ] Rash   Niacin Diarrhea   Nifedipine Diarrhea   Rosuvastatin     Other reaction(s): Headache     REVIEW OF SYSTEMS (Negative unless checked)   Constitutional: [x] Weight loss  [] Fever  [] Chills Cardiac: [] Chest pain   [] Chest pressure   [] Palpitations   [] Shortness of breath when laying flat   [] Shortness of breath at rest   [] Shortness of breath with exertion. Vascular:  [] Pain in legs with walking   [] Pain in legs at rest   [] Pain in legs when laying flat   [] Claudication   [] Pain in feet when walking  [] Pain in feet at rest  [] Pain in feet when laying flat   [] History of DVT   [] Phlebitis   [] Swelling in legs   [] Varicose veins   [] Non-healing ulcers Pulmonary:   [] Uses home oxygen   [] Productive cough   [] Hemoptysis   [] Wheeze  [x] COPD   [] Asthma Neurologic:  [] Dizziness  [] Blackouts   [] Seizures   [x] History of stroke   [] History of TIA  [] Aphasia   [] Temporary blindness   [x] Dysphagia   [x] Weakness or numbness in arms   [] Weakness or numbness in legs Musculoskeletal:  [x] Arthritis   [] Joint swelling   [x] Joint pain   [] Low back pain Hematologic:  [] Easy bruising  [] Easy bleeding   [] Hypercoagulable state   [x] Anemic   Gastrointestinal:   [] Blood in stool   [] Vomiting blood  [] Gastroesophageal reflux/heartburn   [] Abdominal pain Genitourinary:  [] Chronic kidney disease   [] Difficult urination  [] Frequent urination  [] Burning with urination   [] Hematuria Skin:  [] Rashes   [] Ulcers   [] Wounds Psychological:  [x] History of anxiety   [x]  History of major depression.  Physical Examination  There were no vitals taken for this visit. Gen:  WD/WN, NAD Head: [place]/AT, No temporalis wasting. Ear/Nose/Throat: Hearing grossly intact, nares w/o erythema or drainage Eyes: Conjunctiva clear. Sclera non-icteric Neck: Supple.  Trachea midline Pulmonary:  Good air movement, no use of accessory muscles.  Cardiac: RRR, no JVD Vascular:  Vessel Right Left  Radial Palpable Palpable                          PT Palpable Palpable  DP Palpable Palpable   Gastrointestinal: soft, non-tender/non-distended. No guarding/reflex.  Musculoskeletal: M/S 5/5 throughout.  No deformity or atrophy. *** edema. Neurologic: Sensation grossly intact in extremities.  Symmetrical.  Speech is fluent.  Psychiatric: Judgment intact, Mood & affect appropriate for pt's clinical situation. Dermatologic: No rashes or ulcers noted.  No cellulitis or open wounds.      Labs No results found for this or any previous visit (from the past 2160 hours).  Radiology No results found.  Assessment/Plan  No problem-specific Assessment & Plan notes found for this encounter.  Bilateral carotid artery stenosis Follow with duplex going forward.   Hypertension blood pressure control important in reducing the progression of atherosclerotic disease. On appropriate oral medications.  Selinda Gu, MD  03/23/2024 9:45 AM    This note was created with Dragon medical transcription system.  Any errors from dictation are purely unintentional

## 2024-03-24 NOTE — Assessment & Plan Note (Signed)
 Carotid duplex today shows 1 to 39% ICA stenosis bilaterally.  Abnormal flow was seen in the left subclavian artery which is consistent with previous CT scan.  She is not have any current disabling symptoms and no major changes since her last visit so I would not recommend any intervention at this time.  We will continue to follow her yearly with noninvasive studies.

## 2024-04-07 ENCOUNTER — Other Ambulatory Visit: Payer: Self-pay

## 2024-04-07 ENCOUNTER — Emergency Department

## 2024-04-07 ENCOUNTER — Emergency Department
Admission: EM | Admit: 2024-04-07 | Discharge: 2024-04-07 | Disposition: A | Discharge status: Home / Self Care | Source: Ambulatory Visit | Attending: Emergency Medicine | Admitting: Emergency Medicine

## 2024-04-07 DIAGNOSIS — J449 Chronic obstructive pulmonary disease, unspecified: Secondary | ICD-10-CM | POA: Diagnosis not present

## 2024-04-07 DIAGNOSIS — E871 Hypo-osmolality and hyponatremia: Secondary | ICD-10-CM | POA: Insufficient documentation

## 2024-04-07 DIAGNOSIS — R1031 Right lower quadrant pain: Secondary | ICD-10-CM | POA: Insufficient documentation

## 2024-04-07 DIAGNOSIS — R079 Chest pain, unspecified: Secondary | ICD-10-CM | POA: Diagnosis not present

## 2024-04-07 DIAGNOSIS — I10 Essential (primary) hypertension: Secondary | ICD-10-CM | POA: Insufficient documentation

## 2024-04-07 LAB — BASIC METABOLIC PANEL WITH GFR
Anion gap: 9 (ref 5–15)
BUN: 15 mg/dL (ref 8–23)
CO2: 29 mmol/L (ref 22–32)
Calcium: 10.1 mg/dL (ref 8.9–10.3)
Chloride: 93 mmol/L — ABNORMAL LOW (ref 98–111)
Creatinine, Ser: 0.65 mg/dL (ref 0.44–1.00)
GFR, Estimated: >60 mL/min (ref >60)
Glucose, Bld: 93 mg/dL (ref 70–99)
Potassium: 4.4 mmol/L (ref 3.5–5.1)
Sodium: 131 mmol/L — ABNORMAL LOW (ref 135–145)

## 2024-04-07 LAB — URINALYSIS, ROUTINE W REFLEX MICROSCOPIC
Bilirubin Urine: NEGATIVE
Glucose, UA: NEGATIVE mg/dL
Hgb urine dipstick: NEGATIVE
Ketones, ur: NEGATIVE mg/dL
Nitrite: NEGATIVE
Protein, ur: NEGATIVE mg/dL
Specific Gravity, Urine: 1.013 (ref 1.005–1.030)
pH: 6 (ref 5.0–8.0)

## 2024-04-07 LAB — CBC
HCT: 45.7 % (ref 36.0–46.0)
Hemoglobin: 15.2 g/dL — ABNORMAL HIGH (ref 12.0–15.0)
MCH: 29.9 pg (ref 26.0–34.0)
MCHC: 33.3 g/dL (ref 30.0–36.0)
MCV: 90 fL (ref 80.0–100.0)
Platelets: 236 K/uL (ref 150–400)
RBC: 5.08 MIL/uL (ref 3.87–5.11)
RDW: 12.2 % (ref 11.5–15.5)
WBC: 6.8 K/uL (ref 4.0–10.5)
nRBC: 0 % (ref 0.0–0.2)

## 2024-04-07 LAB — HEPATIC FUNCTION PANEL
ALT: 18 U/L (ref 0–44)
AST: 20 U/L (ref 15–41)
Albumin: 3.7 g/dL (ref 3.5–5.0)
Alkaline Phosphatase: 59 U/L (ref 38–126)
Bilirubin, Direct: <0.1 mg/dL (ref 0.0–0.2)
Total Bilirubin: 0.5 mg/dL (ref 0.0–1.2)
Total Protein: 7.1 g/dL (ref 6.5–8.1)

## 2024-04-07 LAB — TROPONIN I (HIGH SENSITIVITY)
Troponin I (High Sensitivity): 5 ng/L (ref <18)
Troponin I (High Sensitivity): 4 ng/L (ref <18)

## 2024-04-07 LAB — LIPASE, BLOOD: Lipase: 53 U/L — ABNORMAL HIGH (ref 11–51)

## 2024-04-07 MED ORDER — ONDANSETRON HCL 4 MG/2ML IJ SOLN
4.0000 mg | Freq: Once | INTRAMUSCULAR | Status: AC
  Administered 2024-04-07: 4 mg via INTRAVENOUS
  Filled 2024-04-07: qty 2

## 2024-04-07 MED ORDER — IOHEXOL 300 MG/ML  SOLN
80.0000 mL | Freq: Once | INTRAMUSCULAR | Status: AC | PRN
  Administered 2024-04-07: 80 mL via INTRAVENOUS

## 2024-04-07 NOTE — ED Triage Notes (Signed)
 Pt comes in via pov sent [facility] from [facility] with complaints of chest pain that started about a week ago. Pt states that she feels dizzy and weak. Pt complains of pain 5/10. Pt states that she has also been having nausea,abdominal pain, and diarrhea the past week as well. Pt has a history of copd, but has no complaints of SOB. Pt is alert and oriented x4 with no signs of acute distress at this time.

## 2024-04-07 NOTE — Discharge Instructions (Addendum)
 Your exam, labs, chest x-ray, EKG, and abdominal CT scan are all normal and reassuring at this time.  No evidence of a serious lung or heart issue causing your nonspecific chest pain.  Also no CT evidence of any colitis, appendicitis, kidney stones or kidney infection to explain your right sided abdominal discomfort.  Continue to take your home medicines as prescribed.  Follow-up with your primary provider or GI specialist as discussed.

## 2024-04-07 NOTE — ED Provider Notes (Signed)
 [facility] Emergency Department Provider Note     Event Date/Time   First MD Initiated Contact with Patient 04/07/24 1712     (approximate)   History   Chest Pain   HPI  Sylvia Richardson is a 68 y.o. female with a history of HTN, HLD, COPD, bipolar disorder, anxiety, depression, and GERD, presents to the ED from a local urgent care.  By report, she has had about a week of intermittent chest pain.  She reports some associated dizziness and generalized weakness.  She describes her pain at a 5 out of 10.  She also notes some intermittent nausea and abdominal discomfort as well as several days of intermittent loose watery stools.  Patient denies any frank fevers, chest pain, or shortness of breath.  She denies any bad food exposure, recent travel, or sick contacts.  She presents to the ED for further evaluation of her complaints.  Physical Exam   Triage Vital Signs: ED Triage Vitals  Encounter Vitals Group     BP 04/07/24 1627 (!) 174/74     Girls Systolic BP Percentile --      Girls Diastolic BP Percentile --      Boys Systolic BP Percentile --      Boys Diastolic BP Percentile --      Pulse Rate 04/07/24 1627 68     Resp 04/07/24 1627 17     Temp 04/07/24 1627 98.9 F (37.2 C)     Temp src --      SpO2 04/07/24 1627 97 %     Weight 04/07/24 1630 110 lb (49.9 kg)     Height 04/07/24 1630 5' 4 (1.626 m)     Head Circumference --      Peak Flow --      Pain Score 04/07/24 1630 5     Pain Loc --      Pain Education --      Exclude from Growth Chart --     Most recent vital signs: Vitals:   04/07/24 1627 04/07/24 1946  BP: (!) 174/74 (!) 155/75  Pulse: 68 64  Resp: 17 17  Temp: 98.9 F (37.2 C) 98.3 F (36.8 C)  SpO2: 97% 95%    General Awake, no distress. NAD HEENT NCAT. PERRL. EOMI. No rhinorrhea. Mucous membranes are moist. CV:  Good peripheral perfusion. RRR RESP:  Normal effort. CTA ABD:  No distention.  Soft and nontender.   Normoactive bowel sounds noted.  No rebound, guarding, or rigidity noted.  No organomegaly elicited.  No CVA tenderness noted.   ED Results / Procedures / Treatments   Labs (all labs ordered are listed, but only abnormal results are displayed) Labs Reviewed  BASIC METABOLIC PANEL WITH GFR - Abnormal; Notable for the following components:      Result Value   Sodium 131 (*)    Chloride 93 (*)    All other components within normal limits  CBC - Abnormal; Notable for the following components:   Hemoglobin 15.2 (*)    All other components within normal limits  LIPASE, BLOOD - Abnormal; Notable for the following components:   Lipase 53 (*)    All other components within normal limits  URINALYSIS, ROUTINE W REFLEX MICROSCOPIC - Abnormal; Notable for the following components:   Color, Urine YELLOW (*)    APPearance CLEAR (*)    Leukocytes,Ua TRACE (*)    Bacteria, UA RARE (*)    All other components within  normal limits  HEPATIC FUNCTION PANEL  TROPONIN I (HIGH SENSITIVITY)  TROPONIN I (HIGH SENSITIVITY)    EKG  Vent. rate 68 BPM PR interval 158 ms QRS duration 86 ms QT/QTcB 410/435 ms P-R-T axes 80 87 78 Normal sinus rhythm Minimal voltage criteria for LVH, may be normal variant ( Sokolow-Lyon ) Borderline ECG No STEMI  RADIOLOGY  I personally viewed and evaluated these images as part of my medical decision making, as well as reviewing the written report by the radiologist.  ED Provider Interpretation: No acute findings  CT ABDOMEN PELVIS W CONTRAST Result Date: 04/07/2024 CLINICAL DATA:  Right lower quadrant pain, diarrhea, chest pain. Dizziness and weakness. EXAM: CT ABDOMEN AND PELVIS WITH CONTRAST TECHNIQUE: Multidetector CT imaging of the abdomen and pelvis was performed using the standard protocol following bolus administration of intravenous contrast. RADIATION DOSE REDUCTION: This exam was performed according to the departmental dose-optimization program which  includes automated exposure control, adjustment of the mA and/or kV according to patient size and/or use of iterative reconstruction technique. CONTRAST:  80mL OMNIPAQUE  IOHEXOL  300 MG/ML  SOLN COMPARISON:  02/09/2023 and 07/29/2022. FINDINGS: Lower chest: No acute findings. Atherosclerotic calcification of the aorta, aortic valve and coronary arteries. Heart size normal. No pericardial effusion. No pleural effusion. Distal esophagus is grossly unremarkable. Hepatobiliary: Small cyst in the dome of the liver. No specific follow-up necessary. Cholecystectomy. No biliary ductal dilatation. Pancreas: Negative. Spleen: Negative. Adrenals/Urinary Tract: Right adrenal gland is unremarkable. 11 mm left adrenal nodule, unchanged from 07/29/2022, indicative of a benign adenoma. No specific follow-up necessary. Low-attenuation lesions in the kidneys. No specific follow-up necessary. Ureters are decompressed. Bladder is grossly unremarkable. Stomach/Bowel: Hiatal hernia repair. Stomach, small bowel, appendix and colon are unremarkable. Vascular/Lymphatic: Atherosclerotic calcification of the aorta. Infrarenal aorta measures up to 2.4 cm. No pathologically enlarged lymph nodes. Reproductive: Uterus is visualized.  No adnexal mass. Other: Small bilateral inguinal hernias contain fat. No free fluid. Mesenteries and peritoneum are unremarkable. Musculoskeletal: Degenerative changes in the spine. IMPRESSION: 1. No acute findings. 2. Left adrenal adenoma. 3. Aortic atherosclerosis (ICD10-I70.0). Coronary artery calcification. Electronically Signed   By: Newell Eke M.D.   On: 04/07/2024 19:18   DG Chest 2 View Result Date: 04/07/2024 CLINICAL DATA:  Chest pain, dizziness and weakness. EXAM: CHEST - 2 VIEW COMPARISON:  02/09/2023 and CT chest 02/02/2024. FINDINGS: Trachea is midline. Heart size stable. Thoracic aorta is calcified. Chronically elevated right hemidiaphragm. Lungs are clear. No pleural fluid. Degenerative changes  in the spine. IMPRESSION: No acute findings. Electronically Signed   By: Newell Eke M.D.   On: 04/07/2024 17:05    PROCEDURES:  Critical Care performed: No  Procedures   MEDICATIONS ORDERED IN ED: Medications  ondansetron  (ZOFRAN ) injection 4 mg (4 mg Intravenous Given 04/07/24 1843)  iohexol  (OMNIPAQUE ) 300 MG/ML solution 80 mL (80 mLs Intravenous Contrast Given 04/07/24 1847)     IMPRESSION / MDM / ASSESSMENT AND PLAN / ED COURSE  I reviewed the triage vital signs and the nursing notes.                              Differential diagnosis includes, but is not limited to, ACS, aortic dissection, pulmonary embolism, cardiac tamponade, pneumothorax, pneumonia, pericarditis, myocarditis, GI-related causes including esophagitis/gastritis, and musculoskeletal chest wall pain.     Patient's presentation is most consistent with acute complicated illness / injury requiring diagnostic workup.  Patient's diagnosis is consistent with nonspecific  chest pain and right lower quadrant abdominal pain.  Patient present with intermittent symptoms including epigastric abdominal chest pain, and right lower quadrant Donnell discomfort.  She is also noted some intermittent loose stools.  Exam is overall reassuring without evidence of critical anemia, leukocytosis, or electrolyte abnormality.  Cardiac workup is reassuring including a normal troponin and a reassuring EKG.  Chest x-ray and CT scans are both negative for any acute findings to [place] patient symptoms.  Patient is overall reassured by her normal workup at this time.  Patient will be discharged home with instructions to take her home meds. Patient is to follow up with her primary provider as suggested, as needed or otherwise directed. Patient is given ED precautions to return to the ED for any worsening or new symptoms.     FINAL CLINICAL IMPRESSION(S) / ED DIAGNOSES   Final diagnoses:  Nonspecific chest pain  Right lower quadrant abdominal  pain     Rx / DC Orders   ED Discharge Orders     None        Note:  This document was prepared using Dragon voice recognition software and may include unintentional dictation errors.    Loyd Candida LULLA Aldona, PA-C 04/08/24 0005    Dorothyann Drivers, MD 04/08/24 [phone]

## 2024-05-01 ENCOUNTER — Emergency Department
Admission: EM | Admit: 2024-05-01 | Discharge: 2024-05-01 | Discharge status: Against Medical Advice | Attending: Emergency Medicine | Admitting: Emergency Medicine

## 2024-05-01 ENCOUNTER — Other Ambulatory Visit: Payer: Self-pay

## 2024-05-01 DIAGNOSIS — R197 Diarrhea, unspecified: Secondary | ICD-10-CM | POA: Insufficient documentation

## 2024-05-01 DIAGNOSIS — Z5321 Procedure and treatment not carried out due to patient leaving prior to being seen by health care provider: Secondary | ICD-10-CM | POA: Diagnosis not present

## 2024-05-01 DIAGNOSIS — R1310 Dysphagia, unspecified: Secondary | ICD-10-CM | POA: Insufficient documentation

## 2024-05-01 LAB — CBC
HCT: 41 % (ref 36.0–46.0)
Hemoglobin: 14 g/dL (ref 12.0–15.0)
MCH: 30.1 pg (ref 26.0–34.0)
MCHC: 34.1 g/dL (ref 30.0–36.0)
MCV: 88.2 fL (ref 80.0–100.0)
Platelets: 202 K/uL (ref 150–400)
RBC: 4.65 MIL/uL (ref 3.87–5.11)
RDW: 12.3 % (ref 11.5–15.5)
WBC: 7.8 K/uL (ref 4.0–10.5)
nRBC: 0 % (ref 0.0–0.2)

## 2024-05-01 LAB — COMPREHENSIVE METABOLIC PANEL WITH GFR
ALT: 14 U/L (ref 0–44)
AST: 18 U/L (ref 15–41)
Albumin: 3.3 g/dL — ABNORMAL LOW (ref 3.5–5.0)
Alkaline Phosphatase: 68 U/L (ref 38–126)
Anion gap: 8 (ref 5–15)
BUN: 11 mg/dL (ref 8–23)
CO2: 29 mmol/L (ref 22–32)
Calcium: 9.8 mg/dL (ref 8.9–10.3)
Chloride: 95 mmol/L — ABNORMAL LOW (ref 98–111)
Creatinine, Ser: 0.44 mg/dL (ref 0.44–1.00)
GFR, Estimated: >60 mL/min (ref >60)
Glucose, Bld: 114 mg/dL — ABNORMAL HIGH (ref 70–99)
Potassium: 3.6 mmol/L (ref 3.5–5.1)
Sodium: 132 mmol/L — ABNORMAL LOW (ref 135–145)
Total Bilirubin: 0.7 mg/dL (ref 0.0–1.2)
Total Protein: 6.5 g/dL (ref 6.5–8.1)

## 2024-05-01 LAB — LIPASE, BLOOD: Lipase: 169 U/L — ABNORMAL HIGH (ref 11–51)

## 2024-05-01 NOTE — ED Triage Notes (Signed)
 BIB EMS from home for diarrheax1 month, hx of difficulty swallowing, had difficulty swallowing today, but then states she was able to drink since the difficulty swallowing.

## 2024-05-01 NOTE — ED Triage Notes (Signed)
 BIB ems from home for diarrhea x1 month VS 174/84, hr 70, 97.8, cbg 118  A&ox4, nadn

## 2024-05-18 ENCOUNTER — Other Ambulatory Visit: Payer: Self-pay

## 2024-05-18 ENCOUNTER — Ambulatory Visit
Admission: RE | Admit: 2024-05-18 | Discharge: 2024-05-18 | Disposition: A | Discharge status: Home / Self Care | Attending: Gastroenterology | Admitting: Gastroenterology

## 2024-05-18 ENCOUNTER — Ambulatory Visit: Admitting: Certified Registered Nurse Anesthetist

## 2024-05-18 ENCOUNTER — Encounter
Admission: RE | Disposition: A | Discharge status: Home / Self Care | Payer: Self-pay | Source: Home / Self Care | Attending: Gastroenterology

## 2024-05-18 DIAGNOSIS — J4489 Other specified chronic obstructive pulmonary disease: Secondary | ICD-10-CM | POA: Insufficient documentation

## 2024-05-18 DIAGNOSIS — E7800 Pure hypercholesterolemia, unspecified: Secondary | ICD-10-CM | POA: Diagnosis not present

## 2024-05-18 DIAGNOSIS — I10 Essential (primary) hypertension: Secondary | ICD-10-CM | POA: Insufficient documentation

## 2024-05-18 DIAGNOSIS — I6930 Unspecified sequelae of cerebral infarction: Secondary | ICD-10-CM | POA: Insufficient documentation

## 2024-05-18 DIAGNOSIS — D120 Benign neoplasm of cecum: Secondary | ICD-10-CM | POA: Diagnosis not present

## 2024-05-18 DIAGNOSIS — K219 Gastro-esophageal reflux disease without esophagitis: Secondary | ICD-10-CM | POA: Diagnosis not present

## 2024-05-18 DIAGNOSIS — K640 First degree hemorrhoids: Secondary | ICD-10-CM | POA: Diagnosis not present

## 2024-05-18 DIAGNOSIS — K529 Noninfective gastroenteritis and colitis, unspecified: Secondary | ICD-10-CM | POA: Insufficient documentation

## 2024-05-18 DIAGNOSIS — K621 Rectal polyp: Secondary | ICD-10-CM | POA: Diagnosis not present

## 2024-05-18 DIAGNOSIS — K5730 Diverticulosis of large intestine without perforation or abscess without bleeding: Secondary | ICD-10-CM | POA: Insufficient documentation

## 2024-05-18 DIAGNOSIS — F17200 Nicotine dependence, unspecified, uncomplicated: Secondary | ICD-10-CM | POA: Insufficient documentation

## 2024-05-18 DIAGNOSIS — D123 Benign neoplasm of transverse colon: Secondary | ICD-10-CM | POA: Insufficient documentation

## 2024-05-18 HISTORY — PX: POLYPECTOMY: SHX149

## 2024-05-18 HISTORY — PX: COLONOSCOPY: SHX5424

## 2024-05-18 SURGERY — COLONOSCOPY
Anesthesia: General

## 2024-05-18 MED ORDER — LIDOCAINE HCL (CARDIAC) PF 100 MG/5ML IV SOSY
PREFILLED_SYRINGE | INTRAVENOUS | Status: DC | PRN
  Administered 2024-05-18: 50 mg via INTRAVENOUS

## 2024-05-18 MED ORDER — PROPOFOL 10 MG/ML IV BOLUS
INTRAVENOUS | Status: DC | PRN
  Administered 2024-05-18: 30 mg via INTRAVENOUS
  Administered 2024-05-18: 70 mg via INTRAVENOUS

## 2024-05-18 MED ORDER — PROPOFOL 500 MG/50ML IV EMUL
INTRAVENOUS | Status: DC | PRN
  Administered 2024-05-18: 100 ug/kg/min via INTRAVENOUS

## 2024-05-18 MED ORDER — SODIUM CHLORIDE 0.9 % IV SOLN: INTRAVENOUS | Status: DC

## 2024-05-18 MED ORDER — PROPOFOL 1000 MG/100ML IV EMUL
INTRAVENOUS | Status: AC
  Filled 2024-05-18: qty 100

## 2024-05-18 NOTE — H&P (Signed)
 Outpatient short stay form Pre-procedure 05/18/2024  Ole ONEIDA Schick, MD  Primary Physician: Delfina Pao, MD  Reason for visit:  Chronic diarrhea, abdominal pain, weight loss, history of adenomatous polyps  History of present illness:    68 y/o lady with history of COPD, hypertension, bipolar, and tobacco abuse here for colonoscopy for chronic diarrhea, abdominal pain, unintentional weight loss, and history of adenomatous polyps. Last colonoscopy was a little over 1 year ago. No blood thinners except aspirin. History of cholecystectomy and hiatal hernia repair.    Current Facility-Administered Medications:    0.9 %  sodium chloride  infusion, , Intravenous, Continuous, Khaza Blansett, Ole ONEIDA, MD, Last Rate: 20 mL/hr at 05/18/24 1211, New Bag at 05/18/24 1211  Medications Prior to Admission  Medication Sig Dispense Refill Last Dose/Taking   atenolol  (TENORMIN ) 100 MG tablet Take 100 mg by mouth daily.  0 05/18/2024 Morning   metoCLOPramide (REGLAN) 10 MG tablet Take 10 mg by mouth in the morning, at noon, in the evening, and at bedtime.   05/18/2024 Morning   aspirin EC 81 MG tablet Take 81 mg by mouth daily.       clonazePAM  (KLONOPIN ) 0.5 MG tablet Take 1 mg by mouth 4 (four) times daily as needed for anxiety.      DALIRESP  500 MCG TABS tablet TAKE 1 TABLET BY MOUTH EVERY DAY(PLEASE KEEP APPT. IN JULY) 30 tablet 3    DULoxetine  (CYMBALTA ) 60 MG capsule Take 120 mg by mouth daily.   0    hydrOXYzine  (ATARAX ) 10 MG tablet Take 1 tablet (10 mg total) by mouth 3 (three) times daily as needed. 30 tablet 0    ibuprofen  (ADVIL ) 200 MG tablet Take 200 mg by mouth every 6 (six) hours as needed.      omeprazole (PRILOSEC) 40 MG capsule Take 40 mg by mouth every evening.      predniSONE  (DELTASONE ) 10 MG tablet Take 10 mg by mouth daily as needed.      PROVENTIL  HFA 108 (90 Base) MCG/ACT inhaler INHALE 2 PUFFS INTO THE LUNGS EVERY 4 HOURS AS NEEDED FOR WHEEZING OR SHORTNESS OF BREATH 6.7 g 1     traZODone (DESYREL) 100 MG tablet Take 100-200 mg by mouth at bedtime.        Allergies  Allergen Reactions   Bactrim  [Sulfamethoxazole -Trimethoprim ] Diarrhea   Ciprofloxacin Anaphylaxis   Clindamycin /Lincomycin Nausea And Vomiting   Zithromax  [Azithromycin ]     Abdomen pain and acid reflux    Choline Fenofibrate     Other reaction(s): Other (See Comments) Other Reaction: LEFT SIDE TONGUE SWELLING Other reaction(s): Other (See Comments) LEFT SIDE TONGUE SWELLING Other Reaction: LEFT SIDE TONGUE SWELLING   Doxycycline Nausea Only   Fish Oil Nausea Only   Levaquin  [Levofloxacin ] Rash   Niacin Diarrhea   Nifedipine Diarrhea   Rosuvastatin     Other reaction(s): Headache     Past Medical History:  Diagnosis Date   Anxiety    Asthma    Asthma-COPD overlap syndrome (HCC)    Atherosclerosis of abdominal aorta (HCC)    Bipolar disorder (HCC)    Cancer (HCC)    skin   Carotid artery disease (HCC)    Chronic cystitis    COPD (chronic obstructive pulmonary disease) (HCC)    Depression    Dysphagia    Epigastric pain    GERD (gastroesophageal reflux disease)    H/O degenerative disc disease    Headache    Hypercholesteremia    Hypertension  Lichen sclerosus    Severe tobacco use disorder    Social phobia    Stroke [facility])    TIA several yrs ago.  no deficits   TIA (transient ischemic attack)    Wears hearing aid in both ears    Has.  does not wear.    Review of systems:  Otherwise negative.    Physical Exam  Gen: Alert, oriented. Appears stated age.  HEENT: PERRLA. Lungs: No respiratory distress CV: RRR Abd: soft, benign, no masses Ext: No edema    Planned procedures: Proceed with colonoscopy. The patient understands the nature of the planned procedure, indications, risks, alternatives and potential complications including but not limited to bleeding, infection, perforation, damage to internal organs and possible oversedation/side effects from anesthesia.  The patient agrees and gives consent to proceed.  Please refer to procedure notes for findings, recommendations and patient disposition/instructions.     Ole ONEIDA Schick, MD [facility]

## 2024-05-18 NOTE — Anesthesia Preprocedure Evaluation (Signed)
 Anesthesia Evaluation  Patient identified by MRN, date of birth, ID band Patient awake    Reviewed: Allergy & Precautions, H&P , NPO status , Patient's Chart, lab work & pertinent test results, reviewed documented beta blocker date and time   Airway Mallampati: II   Neck ROM: full    Dental  (+) Poor Dentition   Pulmonary asthma , COPD,  COPD inhaler, Current Smoker and Patient abstained from smoking.   Pulmonary exam normal        Cardiovascular Exercise Tolerance: Poor hypertension, On Medications + Peripheral Vascular Disease  Normal cardiovascular exam Rhythm:regular Rate:Normal     Neuro/Psych  Headaches TIACVA, Residual Symptoms  negative psych ROS   GI/Hepatic Neg liver ROS,GERD  Medicated,,  Endo/Other  negative endocrine ROS    Renal/GU Renal disease  negative genitourinary   Musculoskeletal   Abdominal   Peds  Hematology negative hematology ROS (+)   Anesthesia Other Findings Past Medical History: No date: Anxiety No date: Asthma No date: Asthma-COPD overlap syndrome (HCC) No date: Atherosclerosis of abdominal aorta (HCC) No date: Bipolar disorder (HCC) No date: Cancer (HCC)     Comment:  skin No date: Carotid artery disease (HCC) No date: Chronic cystitis No date: COPD (chronic obstructive pulmonary disease) (HCC) No date: Depression No date: Dysphagia No date: Epigastric pain No date: GERD (gastroesophageal reflux disease) No date: H/O degenerative disc disease No date: Headache No date: Hypercholesteremia No date: Hypertension No date: Lichen sclerosus No date: Severe tobacco use disorder No date: Social phobia No date: Stroke [facility])     Comment:  TIA several yrs ago.  no deficits No date: TIA (transient ischemic attack) No date: Wears hearing aid in both ears     Comment:  Has.  does not wear. Past Surgical History: No date: BREAST BIOPSY; Left     Comment:  core bx- neg 09/04/2023:  CATARACT EXTRACTION W/PHACO; Left     Comment:  Procedure: CATARACT EXTRACTION PHACO AND INTRAOCULAR               LENS PLACEMENT (IOC) LEFT 10.48 01.09.7;  Surgeon:               Enola Feliciano Hugger, MD;  Location: [facility]               [facility];  Service: Ophthalmology;  Laterality: Left; No date: CESAREAN SECTION No date: CHOLECYSTECTOMY 03/05/2022: COLONOSCOPY WITH PROPOFOL ; N/A     Comment:  Procedure: COLONOSCOPY WITH PROPOFOL ;  Surgeon:               Maryruth Ole DASEN, MD;  Location: [facility] ENDOSCOPY;                Service: Endoscopy;  Laterality: N/A; 03/12/2022: COLONOSCOPY WITH PROPOFOL ; N/A     Comment:  Procedure: COLONOSCOPY WITH PROPOFOL ;  Surgeon: Jinny Carmine, MD;  Location: [facility] ENDOSCOPY;  Service:               Endoscopy;  Laterality: N/A; 11/22/2022: COLONOSCOPY WITH PROPOFOL ; N/A     Comment:  Procedure: COLONOSCOPY WITH PROPOFOL ;  Surgeon:               Maryruth Ole DASEN, MD;  Location: [facility] ENDOSCOPY;                Service: Endoscopy;  Laterality: N/A; No date: ESOPHAGOGASTRODUODENOSCOPY 03/05/2022: ESOPHAGOGASTRODUODENOSCOPY (EGD) WITH PROPOFOL ; N/A  Comment:  Procedure: ESOPHAGOGASTRODUODENOSCOPY (EGD) WITH               PROPOFOL ;  Surgeon: Maryruth Ole DASEN, MD;  Location:               [facility] ENDOSCOPY;  Service: Endoscopy;  Laterality: N/A; No date: HEMORRHOIDECTOMY WITH HEMORRHOID BANDING No date: history colonic polyps No date: Laparoscopic repair paraesophageal hiatal hernia No date: Nissen Fundoplication No date: TONSILLECTOMY No date: TUBAL LIGATION BMI    Body Mass Index: 18.09 kg/m     Reproductive/Obstetrics negative OB ROS                              Anesthesia Physical Anesthesia Plan  ASA: 3  Anesthesia Plan: General   Post-op Pain Management:    Induction:   PONV Risk Score and Plan:   Airway Management Planned:   Additional Equipment:   Intra-op Plan:   Post-operative Plan:    Informed Consent: I have reviewed the patients History and Physical, chart, labs and discussed the procedure including the risks, benefits and alternatives for the proposed anesthesia with the patient or authorized representative who has indicated his/her understanding and acceptance.     Dental Advisory Given  Plan Discussed with: CRNA  Anesthesia Plan Comments:         Anesthesia Quick Evaluation

## 2024-05-18 NOTE — Op Note (Signed)
 [facility] Patient Name: Sylvia Richardson Procedure Date: 05/18/2024 11:06 AM MRN: [id number] Account #: [id number] Date of Birth: [birth year] Admit Type: Outpatient Age: 68 Room: [facility] ENDO ROOM 1 Gender: Female Note Status: Finalized Instrument Name: Colon Scope [id number] Procedure:             Colonoscopy Indications:           Generalized abdominal pain, Chronic diarrhea,                         Follow-up for history of adenomatous polyps in the                         colon Providers:             Ole Schick MD, MD Referring MD:          Darice DOROTHA Cooper, MD (Referring MD) Medicines:             Monitored Anesthesia Care Complications:         No immediate complications. Estimated blood loss:                         Minimal. Procedure:             Pre-Anesthesia Assessment:                        - Prior to the procedure, a History and Physical was                         performed, and patient medications and allergies were                         reviewed. The patient is competent. The risks and                         benefits of the procedure and the sedation options and                         risks were discussed with the patient. All questions                         were answered and informed consent was obtained.                         Patient identification and proposed procedure were                         verified by the physician, the nurse, the                         anesthesiologist, the anesthetist and the technician                         in the endoscopy suite. Mental Status Examination:                         alert and oriented. Airway Examination: normal  oropharyngeal airway and neck mobility. Respiratory                         Examination: clear to auscultation. CV Examination:                         normal. Prophylactic Antibiotics: The patient does not                         require  prophylactic antibiotics. Prior                         Anticoagulants: The patient has taken no anticoagulant                         or antiplatelet agents. ASA Grade Assessment: III - A                         patient with severe systemic disease. After reviewing                         the risks and benefits, the patient was deemed in                         satisfactory condition to undergo the procedure. The                         anesthesia plan was to use monitored anesthesia care                         (MAC). Immediately prior to administration of                         medications, the patient was re-assessed for adequacy                         to receive sedatives. The heart rate, respiratory                         rate, oxygen saturations, blood pressure, adequacy of                         pulmonary ventilation, and response to care were                         monitored throughout the procedure. The physical                         status of the patient was re-assessed after the                         procedure.                        After obtaining informed consent, the colonoscope was                         passed under direct vision. Throughout the procedure,  the patient's blood pressure, pulse, and oxygen                         saturations were monitored continuously. The                         Colonoscope was introduced through the anus and                         advanced to the the terminal ileum, with                         identification of the appendiceal orifice and IC                         valve. The colonoscopy was performed without                         difficulty. The patient tolerated the procedure well.                         The quality of the bowel preparation was good. The                         terminal ileum, ileocecal valve, appendiceal orifice,                         and rectum were photographed. Findings:       The perianal and digital rectal examinations were normal.      The terminal ileum appeared normal. Biopsies were taken with a cold       forceps for histology. Estimated blood loss was minimal.      A post polypectomy scar was found in the cecum. There was residual polyp       tissue. The polyp was removed with a cold snare. Resection and retrieval       were complete. Estimated blood loss was minimal.      Normal mucosa was found in the entire colon. Biopsies for histology were       taken with a cold forceps from the entire colon for evaluation of       microscopic colitis. Estimated blood loss was minimal.      A 3 mm polyp was found in the transverse colon. The polyp was sessile.       The polyp was removed with a cold snare. Resection and retrieval were       complete. Estimated blood loss was minimal.      A few large-mouthed and small-mouthed diverticula were found in the       sigmoid colon.      A tattoo was seen in the sigmoid colon. A post-polypectomy scar was       found at the tattoo site.      Internal hemorrhoids were found during retroflexion. The hemorrhoids       were Grade I (internal hemorrhoids that do not prolapse).      The exam was otherwise without abnormality on direct and retroflexion       views. Impression:            - The examined portion of the ileum was normal.  Biopsied.                        - Post-polypectomy scar in the cecum.                        - Normal mucosa in the entire examined colon. Biopsied.                        - One 3 mm polyp in the transverse colon, removed with                         a cold snare. Resected and retrieved.                        - Diverticulosis in the sigmoid colon.                        - A tattoo was seen in the sigmoid colon. A                         post-polypectomy scar was found at the tattoo site.                        - Internal hemorrhoids.                        - The  examination was otherwise normal on direct and                         retroflexion views. Recommendation:        - Discharge patient to home.                        - Resume previous diet.                        - Continue present medications.                        - Await pathology results.                        - Repeat colonoscopy in 2 years to review the                         polypectomy site.                        - Given smoking history would recommend CTA to                         evaluate for mesenteric ischemia. 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 Procedure Code(s):     --- Professional ---                        [phone], Colonoscopy, flexible; with removal of  tumor(s), polyp(s), or other lesion(s) by snare                         technique                        45380, 59, Colonoscopy, flexible; with biopsy, single                         or multiple Diagnosis Code(s):     --- Professional ---                        K64.0, First degree hemorrhoids                        Z98.890, Other specified postprocedural states                        D12.3, Benign neoplasm of transverse colon (hepatic                         flexure or splenic flexure)                        R10.84, Generalized abdominal pain                        K52.9, Noninfective gastroenteritis and colitis,                         unspecified                        Z86.010, Personal history of colonic polyps                        K57.30, Diverticulosis of large intestine without                         perforation or abscess without bleeding CPT copyright 2022 American Medical Association. All rights reserved. The codes documented in this report are preliminary and upon coder review may  be revised to meet current compliance requirements. Ole Schick MD, MD 05/18/2024 12:49:54 PM Number of Addenda: 0 Note Initiated On:  05/18/2024 11:06 AM Scope Withdrawal Time: 0 hours 15 minutes 8 seconds  Total Procedure Duration: 0 hours 20 minutes 28 seconds  Estimated Blood Loss:  Estimated blood loss was minimal.      [facility]

## 2024-05-18 NOTE — Transfer of Care (Signed)
 Immediate Anesthesia Transfer of Care Note  Patient: Sylvia Richardson  Procedure(s) Performed: COLONOSCOPY POLYPECTOMY, INTESTINE  Patient Location: PACU and Endoscopy Unit  Anesthesia Type:General  Level of Consciousness: awake, drowsy, and patient cooperative  Airway & Oxygen Therapy: Patient Spontanous Breathing  Post-op Assessment: Report given to RN and Post -op Vital signs reviewed and stable  Post vital signs: Reviewed and stable  Last Vitals:  Vitals Value Taken Time  BP 107/55 05/18/24 12:44  Temp 35.4 C 05/18/24 12:44  Pulse 79 05/18/24 12:44  Resp 18 05/18/24 12:44  SpO2 100 % 05/18/24 12:44    Last Pain:  Vitals:   05/18/24 1244  TempSrc: Tympanic  PainSc: 0-No pain         Complications: No notable events documented.

## 2024-05-18 NOTE — Interval H&P Note (Signed)
 History and Physical Interval Note:  05/18/2024 12:14 PM  Sylvia Richardson  has presented today for surgery, with the diagnosis of Chronic diarrhea (K52.9) Abdominal pain, diffuse (R10.84) Hx of adenomatous colonic polyps (Z86.0101) Weight loss, unintentional (R63.4).  The various methods of treatment have been discussed with the patient and family. After consideration of risks, benefits and other options for treatment, the patient has consented to  Procedure(s): COLONOSCOPY (N/A) as a surgical intervention.  The patient's history has been reviewed, patient examined, no change in status, stable for surgery.  I have reviewed the patient's chart and labs.  Questions were answered to the patient's satisfaction.     Ole ONEIDA Schick  Ok to proceed with colonoscopy

## 2024-05-19 ENCOUNTER — Encounter: Payer: Self-pay | Admitting: Gastroenterology

## 2024-05-19 LAB — SURGICAL PATHOLOGY

## 2024-05-19 NOTE — Anesthesia Postprocedure Evaluation (Signed)
 Anesthesia Post Note  Patient: Sylvia Richardson  Procedure(s) Performed: COLONOSCOPY POLYPECTOMY, INTESTINE  Patient location during evaluation: PACU Anesthesia Type: General Level of consciousness: awake and alert Pain management: pain level controlled Vital Signs Assessment: post-procedure vital signs reviewed and stable Respiratory status: spontaneous breathing, nonlabored ventilation, respiratory function stable and patient connected to nasal cannula oxygen Cardiovascular status: blood pressure returned to baseline and stable Postop Assessment: no apparent nausea or vomiting Anesthetic complications: no   No notable events documented.   Last Vitals:  Vitals:   05/18/24 1253 05/18/24 1303  BP: 122/71 (!) 154/75  Pulse: 74 75  Resp: 18 18  Temp:    SpO2: 100% 100%    Last Pain:  Vitals:   05/18/24 1303  TempSrc:   PainSc: 0-No pain                 Lynwood KANDICE Clause

## 2024-05-21 ENCOUNTER — Other Ambulatory Visit: Payer: Self-pay | Admitting: Gastroenterology

## 2024-05-21 DIAGNOSIS — R1084 Generalized abdominal pain: Secondary | ICD-10-CM

## 2024-05-25 ENCOUNTER — Ambulatory Visit
Admission: RE | Admit: 2024-05-25 | Discharge: 2024-05-25 | Disposition: A | Discharge status: Home / Self Care | Source: Ambulatory Visit | Attending: Gastroenterology | Admitting: Gastroenterology

## 2024-05-25 DIAGNOSIS — R1084 Generalized abdominal pain: Secondary | ICD-10-CM | POA: Insufficient documentation

## 2024-05-25 MED ORDER — IOHEXOL 350 MG/ML SOLN
80.0000 mL | Freq: Once | INTRAVENOUS | Status: AC | PRN
  Administered 2024-05-25: 80 mL via INTRAVENOUS

## 2024-09-11 ENCOUNTER — Emergency Department
Admission: EM | Admit: 2024-09-11 | Discharge: 2024-09-11 | Disposition: A | Discharge status: Home / Self Care | Attending: Emergency Medicine | Admitting: Emergency Medicine

## 2024-09-11 ENCOUNTER — Other Ambulatory Visit: Payer: Self-pay

## 2024-09-11 ENCOUNTER — Emergency Department

## 2024-09-11 DIAGNOSIS — J069 Acute upper respiratory infection, unspecified: Secondary | ICD-10-CM | POA: Diagnosis not present

## 2024-09-11 DIAGNOSIS — J449 Chronic obstructive pulmonary disease, unspecified: Secondary | ICD-10-CM | POA: Insufficient documentation

## 2024-09-11 DIAGNOSIS — R059 Cough, unspecified: Secondary | ICD-10-CM | POA: Diagnosis present

## 2024-09-11 LAB — RESP PANEL BY RT-PCR (RSV, FLU A&B, COVID)  RVPGX2
Influenza A by PCR: NEGATIVE
Influenza B by PCR: NEGATIVE
Resp Syncytial Virus by PCR: NEGATIVE
SARS Coronavirus 2 by RT PCR: NEGATIVE

## 2024-09-11 LAB — GROUP A STREP BY PCR: Group A Strep by PCR: NOT DETECTED

## 2024-09-11 MED ORDER — PREDNISONE 50 MG PO TABS: 50.0000 mg | ORAL_TABLET | Freq: Every day | ORAL | 0 refills | Status: AC

## 2024-09-11 MED ORDER — IPRATROPIUM-ALBUTEROL 0.5-2.5 (3) MG/3ML IN SOLN
3.0000 mL | Freq: Once | RESPIRATORY_TRACT | Status: AC
  Administered 2024-09-11: 3 mL via RESPIRATORY_TRACT
  Filled 2024-09-11: qty 3

## 2024-09-11 MED ORDER — ACETAMINOPHEN 500 MG PO TABS
1000.0000 mg | ORAL_TABLET | Freq: Once | ORAL | Status: AC
  Administered 2024-09-11: 1000 mg via ORAL
  Filled 2024-09-11: qty 2

## 2024-09-11 MED ORDER — PREDNISONE 20 MG PO TABS
60.0000 mg | ORAL_TABLET | Freq: Once | ORAL | Status: AC
  Administered 2024-09-11: 60 mg via ORAL
  Filled 2024-09-11: qty 3

## 2024-09-11 NOTE — ED Triage Notes (Signed)
 Pt to ED via POV from home for headache, cough and congestion that started last Friday. Pt states has tried ibuprofen  without any relief. Pt endorses some balance issues but no vision changes, states feels dizzy at times. Face symmetrical at this time. Pt able to ambulate in triage.

## 2024-09-11 NOTE — ED Provider Notes (Signed)
 "  [facility] Provider Note    Event Date/Time   First MD Initiated Contact with Patient 09/11/24 1257     (approximate)   History   Headache (congest) and Nasal Congestion   HPI  Sylvia Richardson is a 68 y.o. female with a history of COPD who presents with complaints of cough, nasal congestion, fatigue.  She is concerned she may have pneumonia     Physical Exam   Triage Vital Signs: ED Triage Vitals  Encounter Vitals Group     BP 09/11/24 1153 139/80     Girls Systolic BP Percentile --      Girls Diastolic BP Percentile --      Boys Systolic BP Percentile --      Boys Diastolic BP Percentile --      Pulse Rate 09/11/24 1153 69     Resp 09/11/24 1153 18     Temp 09/11/24 1153 98.5 F (36.9 C)     Temp Source 09/11/24 1153 Oral     SpO2 09/11/24 1153 96 %     Weight 09/11/24 1155 49.4 kg (109 lb)     Height 09/11/24 1155 1.626 m (5' 4)     Head Circumference --      Peak Flow --      Pain Score 09/11/24 1154 10     Pain Loc --      Pain Education --      Exclude from Growth Chart --     Most recent vital signs: Vitals:   09/11/24 1153  BP: 139/80  Pulse: 69  Resp: 18  Temp: 98.5 F (36.9 C)  SpO2: 96%     General: Awake, no distress.  CV:  Good peripheral perfusion.  Resp:  Normal effort.  Scattered wheezing on exam, no tachypnea or increased work of breathing Abd:  No distention.  Other:     ED Results / Procedures / Treatments   Labs (all labs ordered are listed, but only abnormal results are displayed) Labs Reviewed  GROUP A STREP BY PCR  RESP PANEL BY RT-PCR (RSV, FLU A&B, COVID)  [id number]     EKG  ED ECG REPORT I, Lamar Price, the attending physician, personally viewed and interpreted this ECG.  Date: 09/11/2024  Rhythm: normal sinus rhythm QRS Axis: normal Intervals: normal ST/T Wave abnormalities: normal Narrative Interpretation: no evidence of acute ischemia    RADIOLOGY Chest x-ray view  interpret by me, no pneumonia, question atelectasis    PROCEDURES:  Critical Care performed:   Procedures   MEDICATIONS ORDERED IN ED: Medications  acetaminophen  (TYLENOL ) tablet 1,000 mg (1,000 mg Oral Given 09/11/24 1346)  ipratropium-albuterol  (DUONEB) 0.5-2.5 (3) MG/3ML nebulizer solution 3 mL (3 mLs Nebulization Given 09/11/24 1348)  predniSONE  (DELTASONE ) tablet 60 mg (60 mg Oral Given 09/11/24 1346)  ipratropium-albuterol  (DUONEB) 0.5-2.5 (3) MG/3ML nebulizer solution 3 mL (3 mLs Nebulization Given 09/11/24 1348)     IMPRESSION / MDM / ASSESSMENT AND PLAN / ED COURSE  I reviewed the triage vital signs and the nursing notes. Patient's presentation is most consistent with acute presentation with potential threat to life or bodily function.  Patient presents with cough, mild shortness of breath in the setting of COPD.  Question viral illness, CAP  Will treat with DuoNebs, p.o. prednisone  given wheezing.  Tylenol  for headache  Lab work is overall reassuring, chest x-ray without evidence of pneumonia, discussed with her the need for repeat x-ray in 2 months to confirm atelectasis  and not nodule, she agrees with this plan.  She feels much better after treatment and is reassured by negative COVID and flu swabs and negative chest x-ray, appropriate for discharge at this time with outpatient follow-up        FINAL CLINICAL IMPRESSION(S) / ED DIAGNOSES   Final diagnoses:  Upper respiratory tract infection, unspecified type  Chronic obstructive pulmonary disease, unspecified COPD type (HCC)     Rx / DC Orders   ED Discharge Orders          Ordered    predniSONE  (DELTASONE ) 50 MG tablet  Daily with breakfast        09/11/24 1418             Note:  This document was prepared using Dragon voice recognition software and may include unintentional dictation errors.   Arlander Charleston, MD 09/11/24 1542  "

## 2024-09-13 ENCOUNTER — Other Ambulatory Visit
Admission: RE | Admit: 2024-09-13 | Discharge: 2024-09-13 | Disposition: A | Discharge status: Home / Self Care | Source: Ambulatory Visit | Attending: Pulmonary Disease | Admitting: Pulmonary Disease

## 2024-09-13 DIAGNOSIS — R0789 Other chest pain: Secondary | ICD-10-CM | POA: Insufficient documentation

## 2024-09-13 LAB — D-DIMER, QUANTITATIVE: D-Dimer, Quant: 0.45 ug{FEU}/mL (ref 0.00–0.50)

## 2024-09-14 ENCOUNTER — Other Ambulatory Visit: Payer: Self-pay | Admitting: Pulmonary Disease

## 2024-09-14 DIAGNOSIS — R0789 Other chest pain: Secondary | ICD-10-CM

## 2024-09-17 ENCOUNTER — Ambulatory Visit
Admission: RE | Admit: 2024-09-17 | Discharge: 2024-09-17 | Disposition: A | Discharge status: Home / Self Care | Source: Ambulatory Visit | Attending: Pulmonary Disease | Admitting: Pulmonary Disease

## 2024-09-17 DIAGNOSIS — R0789 Other chest pain: Secondary | ICD-10-CM | POA: Insufficient documentation

## 2024-09-17 MED ORDER — IOHEXOL 350 MG/ML SOLN
75.0000 mL | Freq: Once | INTRAVENOUS | Status: AC | PRN
  Administered 2024-09-17: 75 mL via INTRAVENOUS

## 2025-03-22 ENCOUNTER — Encounter (INDEPENDENT_AMBULATORY_CARE_PROVIDER_SITE_OTHER)

## 2025-03-22 ENCOUNTER — Ambulatory Visit (INDEPENDENT_AMBULATORY_CARE_PROVIDER_SITE_OTHER): Admitting: Vascular Surgery
